# Patient Record
Sex: Male | Born: 1945
Health system: Southern US, Community
[De-identification: ages and names within clinical notes are randomized; demographics above are authoritative.]

## PROBLEM LIST (undated history)

## (undated) DIAGNOSIS — T8859XA Other complications of anesthesia, initial encounter: Secondary | ICD-10-CM

## (undated) DIAGNOSIS — K589 Irritable bowel syndrome without diarrhea: Secondary | ICD-10-CM

## (undated) DIAGNOSIS — I452 Bifascicular block: Secondary | ICD-10-CM

## (undated) DIAGNOSIS — Z860101 Personal history of adenomatous and serrated colon polyps: Secondary | ICD-10-CM

## (undated) DIAGNOSIS — I10 Essential (primary) hypertension: Secondary | ICD-10-CM

## (undated) DIAGNOSIS — E119 Type 2 diabetes mellitus without complications: Secondary | ICD-10-CM

## (undated) DIAGNOSIS — Z9889 Other specified postprocedural states: Secondary | ICD-10-CM

## (undated) DIAGNOSIS — K4020 Bilateral inguinal hernia, without obstruction or gangrene, not specified as recurrent: Secondary | ICD-10-CM

## (undated) DIAGNOSIS — F419 Anxiety disorder, unspecified: Secondary | ICD-10-CM

## (undated) DIAGNOSIS — T7840XA Allergy, unspecified, initial encounter: Secondary | ICD-10-CM

## (undated) DIAGNOSIS — Z8719 Personal history of other diseases of the digestive system: Secondary | ICD-10-CM

## (undated) DIAGNOSIS — Z86010 Personal history of colonic polyps: Secondary | ICD-10-CM

## (undated) DIAGNOSIS — E213 Hyperparathyroidism, unspecified: Secondary | ICD-10-CM

## (undated) DIAGNOSIS — K648 Other hemorrhoids: Secondary | ICD-10-CM

## (undated) DIAGNOSIS — K635 Polyp of colon: Secondary | ICD-10-CM

## (undated) DIAGNOSIS — R351 Nocturia: Secondary | ICD-10-CM

## (undated) DIAGNOSIS — T4145XA Adverse effect of unspecified anesthetic, initial encounter: Secondary | ICD-10-CM

## (undated) DIAGNOSIS — K5730 Diverticulosis of large intestine without perforation or abscess without bleeding: Secondary | ICD-10-CM

## (undated) DIAGNOSIS — K219 Gastro-esophageal reflux disease without esophagitis: Secondary | ICD-10-CM

## (undated) DIAGNOSIS — C4491 Basal cell carcinoma of skin, unspecified: Secondary | ICD-10-CM

## (undated) DIAGNOSIS — R51 Headache: Secondary | ICD-10-CM

## (undated) DIAGNOSIS — K602 Anal fissure, unspecified: Secondary | ICD-10-CM

## (undated) DIAGNOSIS — J302 Other seasonal allergic rhinitis: Secondary | ICD-10-CM

## (undated) DIAGNOSIS — K5150 Left sided colitis without complications: Secondary | ICD-10-CM

## (undated) DIAGNOSIS — E079 Disorder of thyroid, unspecified: Secondary | ICD-10-CM

## (undated) DIAGNOSIS — K4090 Unilateral inguinal hernia, without obstruction or gangrene, not specified as recurrent: Secondary | ICD-10-CM

## (undated) DIAGNOSIS — Z85828 Personal history of other malignant neoplasm of skin: Secondary | ICD-10-CM

## (undated) DIAGNOSIS — E785 Hyperlipidemia, unspecified: Secondary | ICD-10-CM

## (undated) DIAGNOSIS — R112 Nausea with vomiting, unspecified: Secondary | ICD-10-CM

## (undated) DIAGNOSIS — Z973 Presence of spectacles and contact lenses: Secondary | ICD-10-CM

## (undated) DIAGNOSIS — I422 Other hypertrophic cardiomyopathy: Secondary | ICD-10-CM

## (undated) DIAGNOSIS — K429 Umbilical hernia without obstruction or gangrene: Secondary | ICD-10-CM

## (undated) HISTORY — DX: Anal fissure, unspecified: K60.2

## (undated) HISTORY — DX: Irritable bowel syndrome, unspecified: K58.9

## (undated) HISTORY — DX: Unilateral inguinal hernia, without obstruction or gangrene, not specified as recurrent: K40.90

## (undated) HISTORY — DX: Essential (primary) hypertension: I10

## (undated) HISTORY — DX: Basal cell carcinoma of skin, unspecified: C44.91

## (undated) HISTORY — PX: COLONOSCOPY: SHX174

## (undated) HISTORY — DX: Other hemorrhoids: K64.8

## (undated) HISTORY — DX: Hyperlipidemia, unspecified: E78.5

## (undated) HISTORY — DX: Gastro-esophageal reflux disease without esophagitis: K21.9

## (undated) HISTORY — DX: Headache: R51

## (undated) HISTORY — DX: Umbilical hernia without obstruction or gangrene: K42.9

## (undated) HISTORY — PX: OTHER SURGICAL HISTORY: SHX169

## (undated) HISTORY — PX: PARATHYROIDECTOMY: SHX19

## (undated) HISTORY — DX: Type 2 diabetes mellitus without complications: E11.9

## (undated) HISTORY — DX: Polyp of colon: K63.5

## (undated) HISTORY — DX: Diverticulosis of large intestine without perforation or abscess without bleeding: K57.30

## (undated) HISTORY — DX: Hyperparathyroidism, unspecified: E21.3

## (undated) HISTORY — DX: Other seasonal allergic rhinitis: J30.2

## (undated) HISTORY — DX: Allergy, unspecified, initial encounter: T78.40XA

## (undated) HISTORY — DX: Disorder of thyroid, unspecified: E07.9

## (undated) HISTORY — DX: Anxiety disorder, unspecified: F41.9

---

## 1999-04-29 ENCOUNTER — Ambulatory Visit: Admission: RE | Admit: 1999-04-29 | Discharge: 1999-04-29 | Payer: Self-pay | Admitting: Internal Medicine

## 2002-01-06 ENCOUNTER — Encounter: Payer: Self-pay | Admitting: Internal Medicine

## 2002-01-06 ENCOUNTER — Ambulatory Visit (HOSPITAL_COMMUNITY): Admission: RE | Admit: 2002-01-06 | Discharge: 2002-01-06 | Payer: Self-pay | Admitting: Internal Medicine

## 2004-01-03 ENCOUNTER — Ambulatory Visit: Payer: Self-pay

## 2004-01-10 ENCOUNTER — Ambulatory Visit: Payer: Self-pay | Admitting: Cardiology

## 2004-02-13 ENCOUNTER — Ambulatory Visit: Payer: Self-pay | Admitting: Gastroenterology

## 2004-03-11 ENCOUNTER — Ambulatory Visit: Payer: Self-pay | Admitting: Cardiology

## 2005-03-19 ENCOUNTER — Ambulatory Visit: Payer: Self-pay | Admitting: Gastroenterology

## 2005-04-10 ENCOUNTER — Ambulatory Visit: Payer: Self-pay | Admitting: Gastroenterology

## 2005-04-10 ENCOUNTER — Encounter (INDEPENDENT_AMBULATORY_CARE_PROVIDER_SITE_OTHER): Payer: Self-pay | Admitting: *Deleted

## 2005-04-10 DIAGNOSIS — K5730 Diverticulosis of large intestine without perforation or abscess without bleeding: Secondary | ICD-10-CM | POA: Insufficient documentation

## 2006-04-23 ENCOUNTER — Ambulatory Visit: Payer: Self-pay | Admitting: Internal Medicine

## 2006-04-23 LAB — CONVERTED CEMR LAB
ALT: 37 units/L (ref 0–40)
AST: 22 units/L (ref 0–37)
Albumin: 3.9 g/dL (ref 3.5–5.2)
Alkaline Phosphatase: 86 units/L (ref 39–117)
BUN: 18 mg/dL (ref 6–23)
Basophils Absolute: 0.1 10*3/uL (ref 0.0–0.1)
Basophils Relative: 0.8 % (ref 0.0–1.0)
Bilirubin Urine: NEGATIVE
Bilirubin, Direct: 0.2 mg/dL (ref 0.0–0.3)
CO2: 31 meq/L (ref 19–32)
Calcium: 10.5 mg/dL (ref 8.4–10.5)
Chloride: 103 meq/L (ref 96–112)
Cholesterol: 223 mg/dL (ref 0–200)
Creatinine, Ser: 0.9 mg/dL (ref 0.4–1.5)
Direct LDL: 163.3 mg/dL
Eosinophils Absolute: 0.1 10*3/uL (ref 0.0–0.6)
Eosinophils Relative: 1.8 % (ref 0.0–5.0)
GFR calc Af Amer: 111 mL/min
GFR calc non Af Amer: 92 mL/min
Glucose, Bld: 147 mg/dL — ABNORMAL HIGH (ref 70–99)
HCT: 41.2 % (ref 39.0–52.0)
HDL: 33.6 mg/dL — ABNORMAL LOW (ref >39.0)
Hemoglobin, Urine: NEGATIVE
Hemoglobin: 14.2 g/dL (ref 13.0–17.0)
Ketones, ur: NEGATIVE mg/dL
Leukocytes, UA: NEGATIVE
Lymphocytes Relative: 24.6 % (ref 12.0–46.0)
MCHC: 34.5 g/dL (ref 30.0–36.0)
MCV: 84.4 fL (ref 78.0–100.0)
Monocytes Absolute: 0.6 10*3/uL (ref 0.2–0.7)
Monocytes Relative: 7.6 % (ref 3.0–11.0)
Neutro Abs: 5.1 10*3/uL (ref 1.4–7.7)
Neutrophils Relative %: 65.2 % (ref 43.0–77.0)
Nitrite: NEGATIVE
PSA: 0.82 ng/mL (ref 0.10–4.00)
Platelets: 260 10*3/uL (ref 150–400)
Potassium: 4.6 meq/L (ref 3.5–5.1)
RBC: 4.88 M/uL (ref 4.22–5.81)
RDW: 12.7 % (ref 11.5–14.6)
Sodium: 141 meq/L (ref 135–145)
Specific Gravity, Urine: 1.025 (ref 1.000–1.03)
TSH: 2.59 microintl units/mL (ref 0.35–5.50)
Total Bilirubin: 1.3 mg/dL — ABNORMAL HIGH (ref 0.3–1.2)
Total CHOL/HDL Ratio: 6.6
Total Protein, Urine: NEGATIVE mg/dL
Total Protein: 6.7 g/dL (ref 6.0–8.3)
Triglycerides: 147 mg/dL (ref 0–149)
Urine Glucose: NEGATIVE mg/dL
Urobilinogen, UA: 0.2 (ref 0.0–1.0)
VLDL: 29 mg/dL (ref 0–40)
WBC: 7.8 10*3/uL (ref 4.5–10.5)
pH: 6 (ref 5.0–8.0)

## 2006-04-29 ENCOUNTER — Ambulatory Visit: Payer: Self-pay | Admitting: Internal Medicine

## 2006-05-27 ENCOUNTER — Ambulatory Visit: Payer: Self-pay | Admitting: Internal Medicine

## 2006-05-27 LAB — CONVERTED CEMR LAB
ALT: 37 units/L (ref 0–40)
AST: 21 units/L (ref 0–37)
Cholesterol: 122 mg/dL (ref 0–200)
HDL: 32.9 mg/dL — ABNORMAL LOW (ref >39.0)
LDL Cholesterol: 74 mg/dL (ref 0–99)
Total CHOL/HDL Ratio: 3.7
Triglycerides: 78 mg/dL (ref 0–149)
VLDL: 16 mg/dL (ref 0–40)

## 2006-07-02 ENCOUNTER — Encounter: Payer: Self-pay | Admitting: Gastroenterology

## 2006-07-02 ENCOUNTER — Ambulatory Visit (HOSPITAL_COMMUNITY): Admission: RE | Admit: 2006-07-02 | Discharge: 2006-07-02 | Payer: Self-pay | Admitting: Gastroenterology

## 2006-07-02 DIAGNOSIS — K648 Other hemorrhoids: Secondary | ICD-10-CM | POA: Insufficient documentation

## 2006-07-22 ENCOUNTER — Ambulatory Visit: Payer: Self-pay | Admitting: Gastroenterology

## 2006-09-07 ENCOUNTER — Ambulatory Visit: Payer: Self-pay | Admitting: Gastroenterology

## 2007-03-11 ENCOUNTER — Encounter: Payer: Self-pay | Admitting: Internal Medicine

## 2007-03-11 ENCOUNTER — Telehealth: Payer: Self-pay | Admitting: Internal Medicine

## 2007-06-20 DIAGNOSIS — I10 Essential (primary) hypertension: Secondary | ICD-10-CM | POA: Insufficient documentation

## 2007-06-20 DIAGNOSIS — E785 Hyperlipidemia, unspecified: Secondary | ICD-10-CM | POA: Insufficient documentation

## 2007-06-20 DIAGNOSIS — D126 Benign neoplasm of colon, unspecified: Secondary | ICD-10-CM | POA: Insufficient documentation

## 2007-06-20 DIAGNOSIS — R519 Headache, unspecified: Secondary | ICD-10-CM | POA: Insufficient documentation

## 2007-06-20 DIAGNOSIS — K602 Anal fissure, unspecified: Secondary | ICD-10-CM | POA: Insufficient documentation

## 2007-06-20 DIAGNOSIS — K5289 Other specified noninfective gastroenteritis and colitis: Secondary | ICD-10-CM | POA: Insufficient documentation

## 2007-06-20 DIAGNOSIS — R51 Headache: Secondary | ICD-10-CM | POA: Insufficient documentation

## 2007-06-20 DIAGNOSIS — K5190 Ulcerative colitis, unspecified, without complications: Secondary | ICD-10-CM | POA: Insufficient documentation

## 2008-03-28 ENCOUNTER — Telehealth: Payer: Self-pay | Admitting: Internal Medicine

## 2008-05-02 ENCOUNTER — Encounter (INDEPENDENT_AMBULATORY_CARE_PROVIDER_SITE_OTHER): Payer: Self-pay | Admitting: *Deleted

## 2008-05-02 ENCOUNTER — Telehealth: Payer: Self-pay | Admitting: Internal Medicine

## 2008-06-04 ENCOUNTER — Telehealth: Payer: Self-pay | Admitting: Internal Medicine

## 2008-07-13 ENCOUNTER — Telehealth: Payer: Self-pay | Admitting: Gastroenterology

## 2008-07-20 ENCOUNTER — Telehealth: Payer: Self-pay | Admitting: Internal Medicine

## 2008-09-07 ENCOUNTER — Ambulatory Visit: Payer: Self-pay | Admitting: Internal Medicine

## 2008-09-07 LAB — CONVERTED CEMR LAB
ALT: 30 units/L (ref 0–53)
AST: 21 units/L (ref 0–37)
Albumin: 4.2 g/dL (ref 3.5–5.2)
Alkaline Phosphatase: 115 units/L (ref 39–117)
BUN: 18 mg/dL (ref 6–23)
Bilirubin, Direct: 0.2 mg/dL (ref 0.0–0.3)
CO2: 31 meq/L (ref 19–32)
Calcium: 11.2 mg/dL — ABNORMAL HIGH (ref 8.4–10.5)
Chloride: 104 meq/L (ref 96–112)
Cholesterol: 205 mg/dL — ABNORMAL HIGH (ref 0–200)
Creatinine, Ser: 0.9 mg/dL (ref 0.4–1.5)
Direct LDL: 151.6 mg/dL
GFR calc non Af Amer: 91.31 mL/min (ref >60)
Glucose, Bld: 124 mg/dL — ABNORMAL HIGH (ref 70–99)
HDL: 34.1 mg/dL — ABNORMAL LOW (ref >39.00)
Hgb A1c MFr Bld: 5.9 % (ref 4.6–6.5)
Potassium: 4.5 meq/L (ref 3.5–5.1)
Sodium: 141 meq/L (ref 135–145)
Total Bilirubin: 1.6 mg/dL — ABNORMAL HIGH (ref 0.3–1.2)
Total CHOL/HDL Ratio: 6
Total Protein: 7.1 g/dL (ref 6.0–8.3)
Triglycerides: 114 mg/dL (ref 0.0–149.0)
VLDL: 22.8 mg/dL (ref 0.0–40.0)

## 2008-09-13 ENCOUNTER — Ambulatory Visit: Payer: Self-pay | Admitting: Internal Medicine

## 2008-09-14 DIAGNOSIS — E118 Type 2 diabetes mellitus with unspecified complications: Secondary | ICD-10-CM | POA: Insufficient documentation

## 2008-11-15 ENCOUNTER — Telehealth (INDEPENDENT_AMBULATORY_CARE_PROVIDER_SITE_OTHER): Payer: Self-pay | Admitting: *Deleted

## 2008-11-22 ENCOUNTER — Ambulatory Visit: Payer: Self-pay | Admitting: Internal Medicine

## 2008-11-22 LAB — CONVERTED CEMR LAB
ALT: 34 units/L (ref 0–53)
AST: 20 units/L (ref 0–37)
Albumin: 3.8 g/dL (ref 3.5–5.2)
Alkaline Phosphatase: 110 units/L (ref 39–117)
Bilirubin, Direct: 0.2 mg/dL (ref 0.0–0.3)
Cholesterol: 160 mg/dL (ref 0–200)
HDL: 34 mg/dL — ABNORMAL LOW (ref >39.00)
LDL Cholesterol: 110 mg/dL — ABNORMAL HIGH (ref 0–99)
Total Bilirubin: 1.3 mg/dL — ABNORMAL HIGH (ref 0.3–1.2)
Total CHOL/HDL Ratio: 5
Total Protein: 6.8 g/dL (ref 6.0–8.3)
Triglycerides: 78 mg/dL (ref 0.0–149.0)
VLDL: 15.6 mg/dL (ref 0.0–40.0)

## 2008-11-24 ENCOUNTER — Encounter: Payer: Self-pay | Admitting: Internal Medicine

## 2009-11-07 ENCOUNTER — Ambulatory Visit: Payer: Self-pay | Admitting: Internal Medicine

## 2009-11-07 LAB — CONVERTED CEMR LAB
ALT: 30 units/L (ref 0–53)
AST: 24 units/L (ref 0–37)
Albumin: 4.1 g/dL (ref 3.5–5.2)
Alkaline Phosphatase: 108 units/L (ref 39–117)
BUN: 18 mg/dL (ref 6–23)
Basophils Absolute: 0 10*3/uL (ref 0.0–0.1)
Basophils Relative: 0.6 % (ref 0.0–3.0)
Bilirubin Urine: NEGATIVE
Bilirubin, Direct: 0.2 mg/dL (ref 0.0–0.3)
CO2: 30 meq/L (ref 19–32)
Calcium: 11.6 mg/dL — ABNORMAL HIGH (ref 8.4–10.5)
Chloride: 106 meq/L (ref 96–112)
Cholesterol: 206 mg/dL — ABNORMAL HIGH (ref 0–200)
Creatinine, Ser: 0.8 mg/dL (ref 0.4–1.5)
Direct LDL: 152.7 mg/dL
Eosinophils Absolute: 0.1 10*3/uL (ref 0.0–0.7)
Eosinophils Relative: 1.8 % (ref 0.0–5.0)
GFR calc non Af Amer: 98.49 mL/min (ref >60)
Glucose, Bld: 117 mg/dL — ABNORMAL HIGH (ref 70–99)
HCT: 41.7 % (ref 39.0–52.0)
HDL: 33.6 mg/dL — ABNORMAL LOW (ref >39.00)
Hemoglobin, Urine: NEGATIVE
Hemoglobin: 14.5 g/dL (ref 13.0–17.0)
Ketones, ur: NEGATIVE mg/dL
Leukocytes, UA: NEGATIVE
Lymphocytes Relative: 23.9 % (ref 12.0–46.0)
Lymphs Abs: 1.8 10*3/uL (ref 0.7–4.0)
MCHC: 34.7 g/dL (ref 30.0–36.0)
MCV: 86 fL (ref 78.0–100.0)
Monocytes Absolute: 0.5 10*3/uL (ref 0.1–1.0)
Monocytes Relative: 7.2 % (ref 3.0–12.0)
Neutro Abs: 5 10*3/uL (ref 1.4–7.7)
Neutrophils Relative %: 66.5 % (ref 43.0–77.0)
Nitrite: NEGATIVE
PSA: 0.98 ng/mL (ref 0.10–4.00)
Platelets: 213 10*3/uL (ref 150.0–400.0)
Potassium: 5.8 meq/L — ABNORMAL HIGH (ref 3.5–5.1)
RBC: 4.84 M/uL (ref 4.22–5.81)
RDW: 13.4 % (ref 11.5–14.6)
Sodium: 142 meq/L (ref 135–145)
Specific Gravity, Urine: 1.02 (ref 1.000–1.030)
TSH: 2.03 microintl units/mL (ref 0.35–5.50)
Total Bilirubin: 1.1 mg/dL (ref 0.3–1.2)
Total CHOL/HDL Ratio: 6
Total Protein, Urine: NEGATIVE mg/dL
Total Protein: 6.7 g/dL (ref 6.0–8.3)
Triglycerides: 134 mg/dL (ref 0.0–149.0)
Urine Glucose: NEGATIVE mg/dL
Urobilinogen, UA: 0.2 (ref 0.0–1.0)
VLDL: 26.8 mg/dL (ref 0.0–40.0)
WBC: 7.5 10*3/uL (ref 4.5–10.5)
pH: 6.5 (ref 5.0–8.0)

## 2009-11-13 ENCOUNTER — Encounter: Payer: Self-pay | Admitting: Internal Medicine

## 2009-11-13 ENCOUNTER — Ambulatory Visit: Payer: Self-pay | Admitting: Internal Medicine

## 2009-11-13 DIAGNOSIS — E663 Overweight: Secondary | ICD-10-CM | POA: Insufficient documentation

## 2009-11-13 DIAGNOSIS — D170 Benign lipomatous neoplasm of skin and subcutaneous tissue of head, face and neck: Secondary | ICD-10-CM | POA: Insufficient documentation

## 2010-01-03 ENCOUNTER — Telehealth: Payer: Self-pay | Admitting: Gastroenterology

## 2010-04-01 NOTE — Progress Notes (Signed)
Summary: RFs  Phone Note Call from Patient   Summary of Call: Pt had to reschedule apt untill july. Patient is requesting refills to last until then. Initial call taken by: Charlsie Quest,  June 04, 2008 11:12 AM  Follow-up for Phone Call        Called and left message on [phone] requesting medication that needs refills and the pharmacy of choice for same. Follow-up by: Iran Planas CMA,  June 05, 2008 10:24 AM  Additional Follow-up for Phone Call Additional follow up Details #1::        Pt left medication refill request [facility]asc [facility]rol [facility] Medication refilled and left message on pt voicemail in ref to same. Additional Follow-up by: Iran Planas CMA,  June 05, 2008 12:56 PM      Prescriptions: METOPROLOL SUCCINATE 50 MG  TB24 (METOPROLOL SUCCINATE) 1 once daily  #30 x 3   Entered by:   Iran Planas CMA   Authorized by:   Neena Rhymes MD   Signed by:   Iran Planas CMA on 06/05/2008   Method used:   [facility]ically to        [facility] [address] [phone]* (retail)       [address]       [place], [place]  [address]       Ph: [phone] or [phone]       Fax: [phone]   RxID:   [id number] [place] 10 MG TABS (AMLODIPINE BESYLATE) 1 once daily  #30 x 3   Entered by:   Iran Planas CMA   Authorized by:   Neena Rhymes MD   Signed by:   Iran Planas CMA on 06/05/2008   Method used:   Electronically to        [facility] [address] [phone]* (retail)       [place] [address]       [place], [place]  [address]       Ph: [phone] or [phone]       Fax: [phone]   RxID:   [id number]

## 2010-04-01 NOTE — Progress Notes (Signed)
  Phone Note Refill Request Call back at Work Phone [phone]   Refills Requested: Medication #1:  NORVASC 10 MG TABS 1 once daily  Medication #2:  METOPROLOL SUCCINATE 50 MG  TB24 1 once daily. Initial call taken by: Charlsie Quest,  May 02, 2008 5:08 PM  Follow-up for Phone Call        Pt informed  Follow-up by: Charlsie Quest,  May 02, 2008 5:22 PM      Prescriptions: METOPROLOL SUCCINATE 50 MG  TB24 (METOPROLOL SUCCINATE) 1 once daily  #30 x 1   Entered by:   Charlsie Quest   Authorized by:   Neena Rhymes MD   Signed by:   Charlsie Quest on 05/02/2008   Method used:   Electronically to        [facility] [address] [phone]* (retail)       [address]       [place], [place]  [address]       Ph: [phone] or [phone]       Fax: [phone]   RxID:   [id number] NORVASC 10 MG TABS (AMLODIPINE BESYLATE) 1 once daily  #30 x 1   Entered by:   Charlsie Quest   Authorized by:   Neena Rhymes MD   Signed by:   Charlsie Quest on 05/02/2008   Method used:   Electronically to        [facility] [address] [phone]* (retail)       [address]       [place], [place]  [address]       Ph: [phone] or [phone]       Fax: [phone]   RxID:   [id number]

## 2010-04-01 NOTE — Procedures (Signed)
Summary: Gastroenterology flex sig  Gastroenterology flex sig   Imported By: Marisue Humble CMA 06/22/2007 15:40:31  _____________________________________________________________________  External Attachment:    Type:   Image     Comment:   External Document

## 2010-04-01 NOTE — Procedures (Signed)
Summary: Gastroenterology colon  Gastroenterology colon   Imported By: Marisue Humble CMA 06/22/2007 15:41:12  _____________________________________________________________________  External Attachment:    Type:   Image     Comment:   External Document

## 2010-04-01 NOTE — Progress Notes (Signed)
Summary: Schedule office visit   Phone Note Outgoing Call Call back at Home Phone [phone]   Call placed by: Genella Mech CMA [facility]),  January 03, 2010 12:54 PM Summary of Call: Called pt and spoke with wife about the Asacol refills. Explained to pts wife that pt needs to call the office to schedule a office appointment for any further refills. She stated she would have him call and schedule when he gets off of work. Initial call taken by: Genella Mech CMA [facility]),  January 03, 2010 12:55 PM

## 2010-04-01 NOTE — Assessment & Plan Note (Signed)
Summary: EXTENED OFFICE VISIT PER JESSICA/ $50 /NWS   Vital Signs:  Patient profile:   65 year old male Height:      74 inches Weight:      234.50 pounds BMI:     30.22 O2 Sat:      97 % on Room air Temp:     99.1 degrees F oral Pulse rate:   72 / minute BP sitting:   142 / 78  (left arm) Cuff size:   regular  Vitals Entered By: Dwana Curd, CMA (September 13, 2008 1:30 PM)  O2 Flow:  Room air Comments Asacol dosage? 4 daily   Primary Care Provider:  Neena Rhymes MD   History of Present Illness: Has had a good year with no major medical issues. He has  been on an improved life-style with smart food choices and reduced portion size with a 22 lb weight loss over the past 26 months.     Current Medications (verified): 1)  Norvasc 10 Mg Tabs (Amlodipine Besylate) .Marland Kitchen.. 1 Once Daily 2)  Metoprolol Succinate 50 Mg  Tb24 (Metoprolol Succinate) .... Take 1 By Mouth Once Daily Pls Keep July Appt For Addtional Refills  Allergies (verified): No Known Drug Allergies  Past History:  Past Medical History: Reviewed history from 06/20/2007 and no changes required. 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 Work is usual amount of stress.  Review of Systems  The patient denies anorexia, fever, weight loss, weight gain, vision loss, decreased hearing, hoarseness, chest pain, syncope, dyspnea on exertion, peripheral edema,  prolonged cough, headaches, hemoptysis, abdominal pain, melena, hematochezia, severe indigestion/heartburn, hematuria, incontinence, muscle weakness, suspicious skin lesions, difficulty walking, depression, unusual weight change, enlarged lymph nodes, angioedema, and testicular masses.    Physical Exam  General:  WNWD white male in no distress. He has had obvious weight loss and looks much more trim. Head:  Normocephalic and atraumatic without obvious abnormalities. No apparent alopecia or balding. Eyes:  No corneal or conjunctival inflammation noted. EOMI. Perrla. Funduscopic exam benign, without hemorrhages, exudates or papilledema. Vision grossly normal. Ears:  External ear exam shows no significant lesions or deformities.  Otoscopic examination reveals clear canals, tympanic membranes are intact bilaterally without bulging, retraction, inflammation or discharge. Hearing is grossly normal bilaterally. Nose:  no external deformity and no external erythema.   Mouth:  Oral mucosa and oropharynx without lesions or exudates.  Teeth in good repair. Neck:  supple, full ROM, no thyromegaly, and no carotid bruits.   Chest Wall:  no deformities.  AT upper back left there is a lipoma - approx 5 cm, soft. Lungs:  Normal respiratory effort, chest expands symmetrically. Lungs are clear to auscultation, no crackles or wheezes. Heart:  Normal rate and regular rhythm. S1 and S2 normal without gallop, murmur, click, rub or other extra sounds. Abdomen:  soft, non-tender, normal bowel sounds, no masses, no guarding, no rigidity, no abdominal hernia, and no hepatomegaly.   Rectal:  No external abnormalities noted. Normal sphincter tone. No rectal masses or tenderness. Genitalia:  circumcised,  no hydrocele, and no varicocele.  Mild testicular atrophy bilaterally (stable) Prostate:  Prostate gland firm and smooth, no enlargement, nodularity, tenderness, mass, asymmetry or induration. Msk:  normal ROM, no joint  tenderness, no joint swelling, no joint warmth, and no joint deformities.   Pulses:  2+ radial and DP pulses Extremities:  No clubbing, cyanosis, edema, or deformity noted with normal full range of motion of all joints.   Neurologic:  alert & oriented X3, cranial nerves II-XII intact, strength normal in all extremities, gait normal, and DTRs symmetrical and normal.   Skin:  lipoma as noted. scarring from cystic acne -back. Several comedone and active acne lesions back, no suspicious lesions. Cervical Nodes:  no anterior cervical adenopathy and no posterior cervical adenopathy.   Psych:  Oriented X3, memory intact for recent and remote, normally interactive, and good eye contact.     Impression & Recommendations:  Problem # 1:  OTHER ABNORMAL GLUCOSE (ICD-790.29) Chart and e-chart reviewed. There have been several elevated surum glucose readings, to a max of 147 in '08. No previous A1C results or GTT results. Labs for today with a A1C of 5.9% - normal range. Patient with mild hyperglycemia but he does not have convincing evidence of diabetes.  Plan - continue with a prudent low or no sugar diet, moderate carbohydrates.  Problem # 2:  HYPERLIPIDEMIA (ZYY-482.4) Lab reveals LDL of 151.6 with goal of 130 or less and ideally 100 or less. Discussed the NCEP guidelines and the concept of cardiac risk. His level is high in the face of weight loss and a conscientious diet. He had tried lipitor in the past but did not tolerate it due to muscle pain and fatigue. He is willing to give treatment another chance.  Plan - lovastatin 77m daily with f/u lab in 6 weeks. If not at goal will advance to 443m          If not tolerated will try Crestor at 49m43m/wk  His updated medication list for this problem includes:    Lovastatin 20 Mg Tabs (Lovastatin) ....Marland Kitchen 1po once daily  Problem # 3:  HYPERTENSION (ICD-401.9)  His updated medication list for this problem includes:    Norvasc 10 Mg Tabs (Amlodipine  besylate) ....Marland Kitchen 1 once daily    Metoprolol Succinate 50 Mg Tb24 (Metoprolol succinate) ....Marland Kitchen Take 1 by mouth once daily pls keep july appt for addtional refills  BP today: 142/78  [place] control. Recommend BP checks at home. If systolic BP runs consistently greater than 140 will add diuretic to regimen.  Problem # 4:  IBD (ICD-558.9) Stable on present meds.  Problem # 5:  Preventive Health Care (ICD-V70.0) FabBentonb of weight management with 22 lbs loss, if not more. He can already feel the benefits. Exam is unremarkable including a normal digital rectal exam. No PSA done but there is no indication with a normal exam. Current with colorectal cancer screening. Given Tetnus and pneumovax today.  Chart reviewed: he had a chest x-ray '05 read as mild cardiomegaly. He also had 2D echo and nuclear stress test in '05 that were both normal with no left ventricular hypertrophy, normal ejection fraction and no evidence of ischemia. No evidence of any cardiac disease.  In summary: a healthy man who has taken charge of his health. He will return for lipid labs as noted. He will return for office visit in 1 year or as needed.   Complete Medication List: 1)  Norvasc 10 Mg Tabs (Amlodipine  besylate) .Marland Kitchen.. 1 once daily 2)  Metoprolol Succinate 50 Mg Tb24 (Metoprolol succinate) .... Take 1 by mouth once daily pls keep july appt for addtional refills 3)  Asacol 400 Mg Tbec (Mesalamine) .... 4 tabs daily for colitis 4)  Lovastatin 20 Mg Tabs (Lovastatin) .Marland Kitchen.. 1po once daily    Tests: (1) BMP (METABOL)   Sodium                    141 mEq/L                   135-145   Potassium                 4.5 mEq/L                   3.5-5.1   Chloride                  104 mEq/L                   96-112   Carbon Dioxide            31 mEq/L                    19-32   Glucose              [H]  124 mg/dL                   70-99   BUN                       18 mg/dL                    6-23   Creatinine                 0.9 mg/dL                   0.4-1.5   Calcium              [H]  11.2 mg/dL                  8.4-10.5   GFR                       91.31 mL/min                >60  Tests: (2) Lipid Panel (LIPID)   Cholesterol          [H]  205 mg/dL                   0-200     ATP III Classification            Desirable:  < 200 mg/dL                    Borderline High:  200 - 239 mg/dL               High:  > = 240 mg/dL   Triglycerides             114.0 mg/dL                 0.0-149.0     Normal:  <150 mg/dL     Borderline High:  150 - 199 mg/dL   HDL                  [  L]  34.10 mg/dL                 >39.00   VLDL Cholesterol          22.8 mg/dL                  0.0-40.0  CHO/HDL Ratio:  CHD Risk                             6                    Men          Women     1/2 Average Risk     3.4          3.3     Average Risk          5.0          4.4     2X Average Risk          9.6          7.1     3X Average Risk          15.0          11.0                           Tests: (3) Hepatic/Liver Function Panel (HEPATIC)   Total Bilirubin      [H]  1.6 mg/dL                   0.3-1.2   Direct Bilirubin          0.2 mg/dL                   0.0-0.3   Alkaline Phosphatase      115 U/L                     39-117   AST                       21 U/L                      0-37   ALT                       30 U/L                      0-53   Total Protein             7.1 g/dL                    6.0-8.3   Albumin                   4.2 g/dL                    3.5-5.2  Tests: (4) Hemoglobin A1C (A1C)   Hemoglobin A1C            5.9 %                       4.6-6.5     Glycemic Control Guidelines for People with Diabetes:     Non Diabetic:  <6%     Goal  of Therapy: <7%     Additional Action Suggested:  >8%   Tests: (5) Cholesterol LDL - Direct (DIRLDL)  Cholesterol LDL - Direct                             151.6 mg/dL Prescriptions: METOPROLOL SUCCINATE 50 MG  TB24 (METOPROLOL SUCCINATE) take 1 by mouth once daily  Pls keep july appt for addtional refills  #90 x 3   Entered and Authorized by:   Neena Rhymes MD   Signed by:   Neena Rhymes MD on 09/13/2008   Method used:   Electronically to        [facility] (mail-order)             ,          Ph: [phone]       Fax: [phone]   RxID:   [id number] NORVASC 10 MG TABS (AMLODIPINE BESYLATE) 1 once daily  #90 x 3   Entered and Authorized by:   Neena Rhymes MD   Signed by:   Neena Rhymes MD on 09/13/2008   Method used:   Electronically to        [facility] (mail-order)             ,          Ph: [phone]       Fax: [phone]   RxID:   [id number] LOVASTATIN 20 MG TABS (LOVASTATIN) 1po once daily  #30 x 12   Entered and Authorized by:   Neena Rhymes MD   Signed by:   Neena Rhymes MD on 09/13/2008   Method used:   [facility]        [facility] [address] [phone]* (retail)       [place] [address]       [place], [place]  [address]       Ph: [phone] or [phone]       Fax: [phone]   RxID:   [id number]

## 2010-04-01 NOTE — Progress Notes (Signed)
Summary: Refill request last seen 04/2006  Phone Note Refill Request Message from:  Fax from Pharmacy  Refills Requested: Medication #1:  METOPROLOL SUCCINATE 50 MG  TB24 1 once daily.  Medication #2:  NORVASC 10 MG TABS 1 once daily [facility] fax [phone] member #[phone]  pt last seen 04/29/2006 no future appointments scheduled.  Initial call taken by: Iran Planas CMA,  March 28, 2008 4:48 PM  Follow-up for Phone Call        refill BP meds for two mnths. Needs OV with labs in advance: lipid 272.4, B8M 754.4, metabolic 920.1, hepatic 007.1 Follow-up by: Neena Rhymes MD,  March 29, 2008 6:32 AM  Additional Follow-up for Phone Call Additional follow up Details #1::        Left voicemail on pt home # to return call to office in ref to medication. Additional Follow-up by: Iran Planas CMA,  March 30, 2008 10:26 AM    Additional Follow-up for Phone Call Additional follow up Details #2::    Pt requests CB from [facility] Follow-up by: Sherlean Foot CMA,  March 30, 2008 11:52 AM  Additional Follow-up for Phone Call Additional follow up Details #3:: Details for Additional Follow-up Action Taken: Advised pt of Dr. Linda Hedges note, pt transferred to [facility] for appointment. Labs entered into [facility]. Additional Follow-up by: Iran Planas CMA,  March 30, 2008 2:57 PM    Prescriptions: METOPROLOL SUCCINATE 50 MG  TB24 (METOPROLOL SUCCINATE) 1 once daily  #30 x 1   Entered by:   Iran Planas CMA   Authorized by:   Neena Rhymes MD   Signed by:   Iran Planas CMA on 03/30/2008   Method used:   Electronically to        [facility] [address] [phone]* (retail)       [address]       [place], [place]  [address]       Ph: [phone] or [phone]       Fax: [phone]   RxID:   [id number] NORVASC 10 MG TABS (AMLODIPINE BESYLATE) 1 once daily  #30 x 1   Entered by:   Iran Planas CMA   Authorized by:    Neena Rhymes MD   Signed by:   Iran Planas CMA on 03/30/2008   Method used:   Electronically to        [facility] [address] [phone]* (retail)       [place] [address]       [place], [place]  [address]       Ph: [phone] or [phone]       Fax: [phone]   RxID:   [id number]

## 2010-04-01 NOTE — Letter (Signed)
[facility] [place], [place]  [address] Phone: [phone]      November 24, 2008   [place] [address] [place], [place] [address]  RE:  LAB RESULTS  Dear  [name],  The following is an interpretation of your most recent lab tests.  Please take note of any instructions provided or changes to medications that have resulted from your lab work.  LIVER FUNCTION TESTS:  Good - no changes needed  LIPID PANEL:  Good - no changes needed Triglyceride: 78.0   Cholesterol: 160   LDL: 110   HDL: 34.00   Chol/HDL%:  5     Very good response to medication. Continue present treatment.  Call or e-mail me if you have questions ([email or web address]).   Sincerely Yours,    Neena Rhymes MD  Patient: Patrick Floyd Note: All result statuses are Final unless otherwise noted.  Tests: (1) Lipid Panel (LIPID)   Cholesterol               160 mg/dL                   0-200     ATP III Classification            Desirable:  < 200 mg/dL                    Borderline High:  200 - 239 mg/dL               High:  > = 240 mg/dL   Triglycerides             78.0 mg/dL                  0.0-149.0     Normal:  <150 mg/dL     Borderline High:  150 - 199 mg/dL   HDL                  [L]  34.00 mg/dL                 >39.00   VLDL Cholesterol          15.6 mg/dL                  0.0-40.0   LDL Cholesterol      [H]  110 mg/dL                   0-99  CHO/HDL Ratio:  CHD Risk                             5                    Men          Women     1/2 Average Risk     3.4          3.3     Average Risk          5.0          4.4     2X Average Risk          9.6          7.1     3X Average Risk          15.0          11.0  Tests: (2) Hepatic/Liver Function Panel (HEPATIC)   Total Bilirubin      [H]  1.3 mg/dL                   0.3-1.2   Direct Bilirubin          0.2 mg/dL                   0.0-0.3   Alkaline Phosphatase       110 U/L                     39-117   AST                       20 U/L                      0-37   ALT                       34 U/L                      0-53   Total Protein             6.8 g/dL                    6.0-8.3   Albumin                   3.8 g/dL                    3.5-5.2

## 2010-04-01 NOTE — Progress Notes (Signed)
Summary: RFs  Phone Note Call from Patient Call back at Work Phone [phone]   Summary of Call: Patient is requesting [facility]ills [facility], did not give names of meds. Initial call taken by: Charlsie Quest,  Jul 20, 2008 5:10 PM  Follow-up for Phone Call        Called pt and asked which medications he needs refills on. Pt requesting both medications to be sent to [facility] with #90 b/c insurance will not allow him to have [facility], pt has an appointment with Dr. Linda Hedges September 13, 2008. Follow-up by: Iran Planas CMA,  Jul 23, 2008 3:49 PM      Prescriptions: METOPROLOL SUCCINATE 50 MG  TB24 (METOPROLOL SUCCINATE) 1 once daily  #90 x 0   Entered by:   Iran Planas CMA   Authorized by:   Neena Rhymes MD   Signed by:   Iran Planas CMA on 07/23/2008   Method used:   Electronically to        [facility] (mail-order)             ,          Ph: [phone]       Fax: [phone]   RxID:   [id number] [place] 10 MG TABS (AMLODIPINE BESYLATE) 1 once daily  #90 x 0   Entered by:   Iran Planas CMA   Authorized by:   Neena Rhymes MD   Signed by:   Iran Planas CMA on 07/23/2008   Method used:   Electronically to        [facility] (mail-order)             ,          Ph: [phone]       Fax: [phone]   RxID:   [id number]

## 2010-04-01 NOTE — Letter (Signed)
Summary: Primary Care Appointment Letter  [facility]   [place], [place] [address]   Phone: [phone]  Fax: [phone]    05/02/2008 MRN: [id number]  HERMILO DUTTER [place], [place]  [address]  Dear Mr. Kumari,   Your Primary Care Physician Neena Rhymes MD has indicated that:    _______it is time to schedule an appointment.    _______you missed your appointment on______ and need to call and          reschedule.    _______you need to have lab work done.    _______you need to schedule an appointment discuss lab or test results.    ___X____you need to call to reschedule your appointment that is                       scheduled on _3/11/10 @ 11:10 WITH DR MICHEAL NORINS.     Please call our office as soon as possible. Our phone number is [phone]. Please press option 1. Our office is open 8a-12noon and 1p-5p, Monday through Friday.     Thank you,    [place]

## 2010-04-01 NOTE — Progress Notes (Signed)
Summary: Mail order meds & Local pharm  Phone Note Call from Patient   Summary of Call: Patient is requesting 2 meds to be called into local pharm [facility] for [facility] supply OK? Norvasc 154m1 once daily & Metoprolol Succ ER 5048m once daily. Initial call taken by: SarCharlsie QuestJanuary  9, 2009 4:32 PM  Follow-up for Phone Call        OK to refill NOrvasc 8m29m0 refill as needed; metoprolol 50 #30 refill prn Follow-up by: MichNeena Rhymes  March 11, 2007 6:17 PM  Additional Follow-up for Phone Call Additional follow up Details #1::        Rx's printed and waiting for signature, to be faxed to called into [facility] Additional Follow-up by: SaraCharlsie Questanuary  9, 2009 6:23 PM    Additional Follow-up for Phone Call Additional follow up Details #2::    rx's faxed Follow-up by: SaraCharlsie Questanuary 13, 2009 8:25 AM  New/Updated Medications: NORVASC 10 MG TABS (AMLODIPINE BESYLATE) 1 once daily METOPROLOL SUCCINATE 50 MG  TB24 (METOPROLOL SUCCINATE) 1 once daily   Prescriptions: METOPROLOL SUCCINATE 50 MG  TB24 (METOPROLOL SUCCINATE) 1 once daily  #90 x 3   Entered by:   SaraCharlsie Questuthorized by:   MichNeena Rhymes  Signed by:   SaraCharlsie Quest01/11/2007   Method used:   Print then Give to Patient   RxID:   [id number] LyonMG TABS (AMLODIPINE BESYLATE) 1 once daily  #90 x 3   Entered by:   SaraCharlsie Questuthorized by:   MichNeena Rhymes  Signed by:   SaraCharlsie Quest01/11/2007   Method used:   Print then Give to Patient   RxID:   [id number] SUCCINATE 50 MG  TB24 (METOPROLOL SUCCINATE) 1 once daily  #30 x 0   Entered by:   SaraCharlsie Questuthorized by:   MichNeena Rhymes  Signed by:   SaraCharlsie Quest01/11/2007   Method used:   Electronically [facility] to ...       [phone]    [address]       [place]  [id number]   Ph: [phone] Fax: [phone]:   [id number] 10 MG TABS (AMLODIPINE BESYLATE) 1 once daily  #30 x 0   Entered by:   SaraCharlsie Questuthorized by:   MichNeena Rhymes  Signed by:   SaraCharlsie Quest01/11/2007   Method used:   [facility] to ...       [facility] #[phone]    [address]    [place]  [id number]   Ph: [phone]   Fax: [phone]:   [id number]

## 2010-04-01 NOTE — Medication Information (Signed)
Summary: Norvasc & Metoprol refills/[facility]  Norvasc & Metoprol refills/[facility]   Imported By: Jerrye Noble D'jimraou 04/04/2007 13:23:22  _____________________________________________________________________  External Attachment:    Type:   Image     Comment:   External Document

## 2010-04-01 NOTE — Progress Notes (Signed)
Summary: Labs  Phone Note Call from Patient   Summary of Call: Patient is requesting order for labs. He has been on lovastatin x aprox 8 wks. Per EMR office note, pt is to come back for lipids. He would like liver checked also.  Initial call taken by: Charlsie Quest, [place],  November 15, 2008 5:29 PM  Follow-up for Phone Call        yes for labs 272.4 lipids, 995.20 hepatic Follow-up by: Neena Rhymes MD,  November 15, 2008 6:53 PM  Additional Follow-up for Phone Call Additional follow up Details #1::        Pt informed, please add to idx [facility]!! Additional Follow-up by: Charlsie Quest, CMA,  November 16, 2008 8:37 AM    Additional Follow-up for Phone Call Additional follow up Details #2::    Orders in [place]. Follow-up by: Denice Paradise,  November 16, 2008 8:43 AM

## 2010-04-01 NOTE — Assessment & Plan Note (Signed)
Summary: PHYSICAL--STC   Vital Signs:  Patient profile:   65 year old male Height:      73 inches Weight:      232 pounds BMI:     30.72 O2 Sat:      98 % on Room air Temp:     97.8 degrees F oral Pulse rate:   50 / minute BP sitting:   130 / 82  (left arm) Cuff size:   regular  Vitals Entered By: Charlynne Cousins CMA (November 13, 2009 10:32 AM)  O2 Flow:  Room air CC: cpx/ ab   Primary Care Provider:  Neena Rhymes MD  CC:  cpx/ ab.  History of Present Illness: Patient presents for routine follow-up.  He reports that he will have pruritis of the hands and mild swelling that occurs spontaneously. It can be very uncomfortable. Claritin helps. He does note that there is a weak association with him being in the den at home around the time his symptoms develop. He has had increased reaction to hymenoptra during the year but has not had any angioedema affecting mouth or throat or airway.  He has had several episodes of GI distress with pain and diarrhea. Does seem to be associated with beer or possibly onions. 5-6 episodes last year. Does use maalox and lomotil with success. He does have a hisotyr of colitis but the symptoms do not seem to be related to flares.  Has a growing mass at the left posterior neck that is becoming uncomfortable. Probably lipoma. Patient due to increasing discomfort is willing  to have surgical excision.   Current Medications (verified): 1)  Norvasc 10 Mg Tabs (Amlodipine Besylate) .Marland Kitchen.. 1 Once Daily 2)  Metoprolol Succinate 50 Mg  Tb24 (Metoprolol Succinate) .... Take 1 By Mouth Once Daily Pls Keep July Appt For Addtional Refills 3)  Asacol 400 Mg Tbec (Mesalamine) .... 4 Tabs Daily For Colitis 4)  Lovastatin 20 Mg Tabs (Lovastatin) .Marland Kitchen.. 1po Once Daily  Allergies (verified): No Known Drug Allergies  Past History:  Past Medical History: DIVERTICULOSIS, COLON (ICD-562.10) COLONIC POLYPS, HYPERPLASTIC (ICD-211.3) HEMORRHOIDS, INTERNAL (ICD-455.0) DM  (ICD-250.00) HYPERLIPIDEMIA (ICD-272.4) HEADACHE (ICD-784.0) HYPERTENSION (ICD-401.9) IBD (ICD-558.9) COLITIS, ULCERATIVE (ICD-556.9) ANAL FISSURE (ICD-565.0)  Past Surgical History: Reviewed history from 09/13/2008 and no changes required. none  Family History: Reviewed history from 09/13/2008 and no changes required. 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 Work is usual amount of stress.  Review of Systems       The patient complains of weight loss, peripheral edema, and abdominal pain.  The patient denies anorexia, fever, vision loss, decreased hearing, hoarseness, chest pain, syncope, dyspnea on exertion, prolonged cough, headaches, melena, hematochezia, incontinence, muscle weakness, suspicious skin lesions, difficulty walking, depression, abnormal bleeding, enlarged lymph nodes, and angioedema.    Physical Exam  General:  Tall overweight white male in no distress Head:  Normocephalic and atraumatic without obvious abnormalities. No apparent alopecia or balding. Eyes:  No corneal or conjunctival inflammation noted. EOMI. Perrla. Funduscopic exam benign, without hemorrhages, exudates or papilledema. Vision grossly normal. Ears:  External ear exam shows no significant lesions or deformities.  Otoscopic examination reveals clear canals, tympanic membranes are intact bilaterally without bulging, retraction, inflammation or discharge. Hearing is grossly normal bilaterally. Nose:  no external deformity and no external erythema.   Mouth:  Oral mucosa and oropharynx without lesions or exudates.  Teeth in good repair. Neck:  supple, no masses, no thyromegaly, and no carotid bruits.   Chest Wall:  no deformities.   Lungs:  Normal respiratory effort, chest expands  symmetrically. Lungs are clear to auscultation, no crackles or wheezes. Heart:  Normal rate and regular rhythm. S1 and S2 normal without gallop, murmur, click, rub or other extra sounds. Abdomen:  soft, non-tender, normal bowel sounds, no distention, no guarding, no rigidity, no inguinal hernia, and no hepatomegaly.   Rectal:  deferred to normal PSA Msk:  normal ROM, no joint tenderness, no joint swelling, and no joint warmth.   Pulses:  2+ radial and dorsalis pedis pulses Extremities:  trace pedal edema bilaterally Neurologic:  alert & oriented X3, cranial nerves II-XII intact, strength normal in all extremities, gait normal, and DTRs symmetrical and normal.   Skin:  Old acne scars on upper back. 6 cm soft tissue mass superior aspect of left scapula/base of neck. Cervical Nodes:  no anterior cervical adenopathy and no posterior cervical adenopathy.   Psych:  Oriented X3, normally interactive, good eye contact, and not anxious appearing.     Impression & Recommendations:  Problem # 1:  OTHER ABNORMAL GLUCOSE (ICD-790.29) Patient has a mild elevation in fasting blood sugar. In last labs serum glucose was slightly highter. At last lab an A1C was in normal range.  Plan - prudent diet: low carbs!  Problem # 2:  HYPERLIPIDEMIA (HYW-737.4) Patient did have a good response to lovastatin with LDL down to 110. However, the drug made him fell bad. New LDL is approx 152 - just shy of NCEP threshold for medical treatment. Did a Framingham Cardiac risk calculation = 17% risk of cardiac event in the next 10 years - moderate risk. Discussed options -   Plan - life-style mangement with low fat diet and increased exercise with repeat lipid panel in six months. If not at or close to goal will need to consider trial of crestor or non-statin therapy  The following medications were removed from the medication list:    Lovastatin 20 Mg Tabs (Lovastatin) .Marland Kitchen... 1po once daily  Problem # 3:  HYPERTENSION  (ICD-401.9)  His updated medication list for this problem includes:    Norvasc 10 Mg Tabs (Amlodipine besylate) .Marland Kitchen... 1 once daily    Metoprolol Succinate 50 Mg Tb24 (Metoprolol succinate) .Marland Kitchen... Take 1 by mouth once daily pls keep july appt for addtional refills  BP today: 130/82 Prior BP: 142/78 (09/13/2008)  Labs Reviewed: K+: 5.8 (11/07/2009) Creat: : 0.8 (11/07/2009)    adequate  but suboptimal control on prsent medications.   Plan - see weight management plan  Problem # 4:  IBD (ICD-558.9) stable on present regimen  Problem # 5:  OBESITY, CLASS I (ICD-278.02) Patient has brought his weight down from approximately 250 to 232 which is very good. discussed the overall importance of weight management in regard to hypertension, hyperlipidemia, cardiac risk and sense of well-being. Discussed weight management strategy of smart food choices, PORTION SIZE CONTROL and exercise.  Plan - weight managment through life-style change. Target weight 210. Goal - loss of 1 lb/month  Problem # 6:  LIPOMA (ICD-214.9) Patient with probable lipoma that has been increasing in size and is no causing discomfort.  Plan - refer to general surgery for consideration of excision.  Problem # 7:  Preventive Health Care (ICD-V70.0)  Interval  history per HPI. His symptoms of hand swelling that responds to claritin suggest allergic reaction. His liver functions are normal. If he has progressive symptoms, including hymenoptra sensitivity, will refer for full allergy evaluation.  His physical exam is normal except for lipoma. Lab results with elevated LDL cholesterol and borderline fasting glucose. Will first work with life-style management. The importance of regular exercise is stressed. Follow-up lab in 6 months. Immunizations - tetns '00, given flu today, he will check for insurance coverage of shingles vaccine. Normal PSA on labs. Had flex sig May '08. Had normal EKG today.  In summary - a nice man who will  work on reducing cholesterol levels, glucose levels which in [place] will help with weight loss.  Orders: EKG w/ Interpretation (93000)  Complete Medication List: 1)  Norvasc 10 Mg Tabs (Amlodipine besylate) .Marland Kitchen.. 1 once daily 2)  Metoprolol Succinate 50 Mg Tb24 (Metoprolol succinate) .... Take 1 by mouth once daily pls keep july appt for addtional refills 3)  Asacol 400 Mg Tbec (Mesalamine) .... 4 tabs daily for colitis  Patient: Patrick Floyd Note: All result statuses are Final unless otherwise noted.  Tests: (1) Lipid Panel (LIPID)   Cholesterol          [H]  206 mg/dL                   0-200     ATP III Classification            Desirable:  < 200 mg/dL                    Borderline High:  200 - 239 mg/dL               High:  > = 240 mg/dL   Triglycerides             134.0 mg/dL                 0.0-149.0     Normal:  <150 mg/dL     Borderline High:  150 - 199 mg/dL   HDL                  [L]  33.60 mg/dL                 >39.00   VLDL Cholesterol          26.8 mg/dL                  0.0-40.0  CHO/HDL Ratio:  CHD Risk                             6                    Men          Women     1/2 Average Risk     3.4          3.3     Average Risk          5.0          4.4     2X Average Risk          9.6          7.1     3X Average Risk          15.0          11.0  Tests: (2) BMP (METABOL)   Sodium                    142 mEq/L                   135-145   Potassium            [H]  5.8 mEq/L                   3.5-5.1   Chloride                  106 mEq/L                   96-112   Carbon Dioxide            30 mEq/L                    19-32   Glucose              [H]  117 mg/dL                   70-99   BUN                       18 mg/dL                    6-23   Creatinine                0.8 mg/dL                   0.4-1.5   Calcium              [H]  11.6 mg/dL                  8.4-10.5   GFR                       98.49 mL/min                >60  Tests:  (3) CBC Platelet w/Diff (CBCD)   White Cell Count          7.5 K/uL                    4.5-10.5   Red Cell Count            4.84 Mil/uL                 4.22-5.81   Hemoglobin                14.5 g/dL                   13.0-17.0   Hematocrit                41.7 %                      39.0-52.0   MCV                       86.0 fl                     78.0-100.0   MCHC  34.7 g/dL                   30.0-36.0   RDW                       13.4 %                      11.5-14.6   Platelet Count            213.0 K/uL                  150.0-400.0   Neutrophil %              66.5 %                      43.0-77.0   Lymphocyte %              23.9 %                      12.0-46.0   Monocyte %                7.2 %                       3.0-12.0   Eosinophils%              1.8 %                       0.0-5.0   Basophils %               0.6 %                       0.0-3.0   Neutrophill Absolute      5.0 K/uL                    1.4-7.7   Lymphocyte Absolute       1.8 K/uL                    0.7-4.0   Monocyte Absolute         0.5 K/uL                    0.1-1.0  Eosinophils, Absolute                             0.1 K/uL                    0.0-0.7   Basophils Absolute        0.0 K/uL                    0.0-0.1  Tests: (4) Hepatic/Liver Function Panel (HEPATIC)   Total Bilirubin           1.1 mg/dL                   0.3-1.2   Direct Bilirubin          0.2 mg/dL                   0.0-0.3   Alkaline Phosphatase      108 U/L  39-117   AST                       24 U/L                      0-37   ALT                       30 U/L                      0-53   Total Protein             6.7 g/dL                    6.0-8.3   Albumin                   4.1 g/dL                    3.5-5.2  Tests: (5) TSH (TSH)   FastTSH                   2.03 uIU/mL                 0.35-5.50  Tests: (6) Prostate Specific Antigen (PSA)   PSA-Hyb                   0.98 ng/mL                   0.10-4.00  Tests: (7) UDip Only (UDIP)   Color                     LT. YELLOW       RANGE:  Yellow;Lt. Yellow   Clarity                   CLEAR                       Clear   Specific Gravity          1.020                       1.000 - 1.030   Urine Ph                  6.5                         5.0-8.0   Protein                   NEGATIVE                    Negative   Urine Glucose             NEGATIVE                    Negative   Ketones                   NEGATIVE                    Negative   Urine Bilirubin           NEGATIVE  Negative   Blood                     NEGATIVE                    Negative   Urobilinogen              0.2                         0.0 - 1.0   Leukocyte Esterace        NEGATIVE                    Negative   Nitrite                   NEGATIVE                    Negative  Tests: (8) Cholesterol LDL - Direct (DIRLDL)  Cholesterol LDL - Direct                             152.7 mg/dL     Optimal:  <100 mg/dL     Near or Above Optimal:  100-129 mg/dL     Borderline High:  130-159 mg/dL     High:  160-189 mg/dL     Very High:  >190 mg/dL  Immunization History:  Tetanus/Td Immunization History:    Tetanus/Td:  historical (06/12/1998)  Influenza Immunization History:    Influenza:  historical (11/13/2009)

## 2010-04-01 NOTE — Progress Notes (Signed)
Summary: Asacol refill   Phone Note From Pharmacy Call back at [facility] [phone]   Summary of Call: Rx sent by fax for Asacol refill   Refilled and faxed back to [facility] Initial call taken by: Genella Mech CMA,  Jul 13, 2008 8:34 AM

## 2010-07-15 NOTE — Assessment & Plan Note (Signed)
[facility] OFFICE NOTE   NAME:Patrick Floyd, Patrick Floyd                     MRN:          [id number]  DATE:09/07/2006                            DOB:          1945/12/17    PROBLEM:  Anal fissure.   REASON:  Mr. Carfagno has returned for re-evaluation.  He underwent Botox  injection a couple of months ago.  His anal fissure symptoms  significantly improved until he had a bout of diarrhea approximately 1  month later which set off his symptoms again.  On conservative therapy  symptoms have again improved.  He is currently just using warm soaks and  baby wipes.  Mild colitis was seen at sigmoidoscopy.  Mr. Kmetz has a  history of ulcerative colitis dating at least 15 years.   PHYSICAL EXAMINATION:  Pulse 76, blood pressure 150/86, weight 240.  HEENT:  EOMI. PERRLA. Sclerae are anicteric.  Conjunctivae are pink.  NECK:  Supple without thyromegaly, adenopathy or carotid bruits.  CHEST:  Clear to auscultation and percussion without adventitious  sounds.  CARDIAC:  Regular rhythm; normal S1 S2.  There are no murmurs, gallops  or rubs.  ABDOMEN:  Bowel sounds are normoactive.  Abdomen is soft, non-tender and  non-distended.  There are no abdominal masses, tenderness, splenic  enlargement or hepatomegaly.  EXTREMITIES:  Full range of motion.  No cyanosis, clubbing or edema.  RECTAL:  There are no masses.  Stool is Hemoccult negative.   IMPRESSION:  1. Anal fissure - improving on conservative therapy - status post      Botox injection.  2. Ulcerative colitis.   RECOMMENDATION:  1. No further medical therapy for fissure.  2. Followup colonoscopy for colorectal cancer screening surveillance.     Sandy Salaam. Deatra Ina, MD,FACG  Electronically Signed    RDK/MedQ  DD: 09/07/2006  DT: 09/07/2006  Job #: [id number]

## 2010-07-18 NOTE — Assessment & Plan Note (Signed)
[facility]                           PRIMARY CARE OFFICE NOTE   NAME:BETHEILAvyay, Floyd                     MRN:          [id number]  DATE:04/29/2006                            DOB:          24-May-1945    [name] is a 65 year old Caucasian gentleman who presents for annual  followup evaluation and exam.  He was last seen in Primary Care  November 09, 2003.  Please see that dictation.  In the interval the  patient has been followed by the Cardiology service for a history of  chest discomfort.  Last Cardiology visit dates from March 11, 2004, at  which time the patient was stable.  The patient in the interval has also  been seen on multiple occasions by the GI service, last March 19, 2005.  He is followed for colitis.  The patient reports that he does  adjust his Asacol on his own whenever he has a flare.   The patient reports that he has had a problem with what he thinks is  hemorrhoidal discomfort and bleeding.  This has not been severe.   PAST SURGICAL HISTORY:  None.   TRAUMA:  Fractured finger at age 32.   PAST MEDICAL HISTORY:  1. Usual childhood disease.  2. Inflammatory bowel disease/colitis since age 65.  34. Hypertension.  4. Headaches.  5. Hyperlipidemia.   CURRENT MEDICATIONS:  1. Toprol XL 50 mg daily.  2. Norvasc 10 mg daily.  3. Asacol 400 mg t.i.d. which he titrates as needed.   PHYSICIAN ROSTER:  Dr. Deatra Ina for GI, Dr. Ron Parker for Cardiology.   FAMILY HISTORY:  Negative for coronary artery disease.  Positive for  hypertension.  Negative for colon cancer.   SOCIAL HISTORY:  The patient's marriage is in good health.  He has a 26-  year-old daughter.  His work is usual stress.   CHART REVIEW:  Last colonoscopy dates from April 10, 2005, which  showed diverticulosis, unspecified colitis and colon polyps.  The  patient did have a stress Myoview study done January 03, 2004, which was  a negative study with no evidence of  ischemic disease.  Last 2D echo  from January 03, 2004, which was an unremarkable study.  Last chest x-  ray is from November 09, 2003, with acute bronchitis with no other  abnormalities noted.   REVIEW OF SYSTEMS:  Negative for any constitutional problems.  He has  had an eye exam in the last 12 months.  No ENT, cardiovascular,  respiratory complaints, GI as above, GU is unremarkable with nocturia  x1.  No musculoskeletal or neurologic complaints.   PHYSICAL EXAMINATION:  Temperature was 97, blood pressure 160/97, pulse  78, weight 256, height 6 feet 1 inch.  GENERAL APPEARANCE:  This is a heavyset, overweight, Caucasian male,  looks his stated age, in no acute distress.  HEENT:  Normocephalic, atraumatic.  EACs and TMS were unremarkable.  Oropharynx with native dentition in good repair.  No buccal or palatal  lesions were noted, posterior pharynx was clear, conjunctivae and  sclerae were clear, PERRLA, EOMI.  Funduscopic exam  was unremarkable.  NECK:  Supple without thyromegaly.  NODES:  No adenopathy was noted in the cervical or supraclavicular  regions.  CHEST:  No CVA tenderness.  LUNGS:  Clear to auscultation and percussion.  CARDIOVASCULAR:  2+ radial pulses, no JVD or carotid bruits.  He had a  quiet precordium with a regular rate and rhythm, without murmurs, rubs  or gallops.  ABDOMEN:  Soft, no guarding, no rebound, no organosplenomegaly was  noted.  GENITALIA:  Normal bilaterally descended testicles.  RECTAL EXAM:  Normal sphincter tone was noted.  There were no external  skin tags or hemorrhoids.  There was a palpable internal hemorrhoid at  the 12 o'clock position which was tender.  The prostate was smooth,  round and normal in size and contour without nodules.  EXTREMITIES:  Without clubbing, cyanosis, edema or deformity.  NEUROLOGIC:  Exam was nonfocal.  SKIN:  Clear.   DATABASE:  Hemoglobin 14.2 grams, white count was 7800 with a normal  differential, all normal.   Chemistries revealed a serum glucose of 147.  Electrolytes were normal, kidney function normal with a creatinine of  0.9 and a GFR of 92 mL/minute.  Liver functions were unremarkable.  Cholesterol 223, triglycerides 147, HDL was 33.6, LDL was 163.3.  Thyroid function normal with a TSH of 2.59.  PSA was normal at 0.82.  Urinalysis was negative.   ASSESSMENT AND PLAN:  1. Hypertension:  The patient reports his blood pressure is much      better at home, in the 120s over 80s.  The plan is to continue his      present medications.  2. Lipids:  The patient's last lipid profile in 2005 had an LDL      cholesterol of 183.  It is now somewhat lower but still above goal      which would be an LDL of 100 or less.  The patient does have some      increase risk for heart disease, see below.  Plan:  Lipitor 20 mg      daily with the patient to return in 4 weeks for followup laboratory      and we will adjust his medications as needed to attain a goal of      LDL less than 100.  3. Diabetes:  The patient had a fasting serum glucose of 147, which      puts him into the diabetic range.  His last serum glucose in 2005      was 117, so this is a trend.  Discussed with the patient the issue      of blood sugar management and the mechanisms of diabetes.  I      stressed the importance of getting this problem under control early      to avoid end organ damage.  Because of his elevated serum glucose      the patient does have increased cardiac risk, therefore the LDL      goal was adjusted downwards to 100 or less, as above.  Plan:  The      patient is provided with information on type 2 diabetes in adults      as well as a carb counting diet.  The first attempt will be      lifestyle management with the patient to avoid sweets, to reduce      carbohydrate intake and to increase his exercise.  The patient will      return in 3  months for a hemoglobin A1c.  I did discuss with the patient the combination of  being overweight,  hypertension, hyperlipidemia and elevated serum glucose as a metabolic  syndrome with the increased risks therein.  I did stress to the patient  the importance of getting all these problems under better control,  particularly lipids and glucose.  He took this to heart and I believe  will in fact modify his diet and increase his exercise.  I did agree  that if he lost 20 to 25 pounds we could recheck labs at that point to  see if he has brought his cholesterol down to a level that he can get by  without medication, although there is some inherent benefit of statin  drugs and diabetics.   HEALTH MAINTENANCE:  The patient has current colorectal cancer  screening, his PSA and prostate exam were normal.   In summary, a pleasant gentleman with problems outlined above, resolved  to manage the serum glucose levels with lifestyle management and we will  start Lipitor as noted.     Heinz Knuckles Norins, MD  Electronically Signed    MEN/MedQ  DD: 04/29/2006  DT: 04/29/2006  Job #: [id number]   cc:   Ladon Applebaum

## 2010-07-19 ENCOUNTER — Other Ambulatory Visit: Payer: Self-pay | Admitting: Internal Medicine

## 2010-10-01 ENCOUNTER — Other Ambulatory Visit: Payer: Self-pay | Admitting: Internal Medicine

## 2010-11-07 ENCOUNTER — Encounter: Payer: Self-pay | Admitting: Gastroenterology

## 2010-11-17 ENCOUNTER — Other Ambulatory Visit: Payer: Self-pay

## 2010-11-20 ENCOUNTER — Encounter: Payer: Self-pay | Admitting: Internal Medicine

## 2010-12-05 ENCOUNTER — Encounter: Payer: Self-pay | Admitting: Gastroenterology

## 2010-12-05 ENCOUNTER — Ambulatory Visit (AMBULATORY_SURGERY_CENTER): Payer: BC Managed Care – PPO | Admitting: *Deleted

## 2010-12-05 VITALS — Ht 73.0 in | Wt 241.2 lb

## 2010-12-05 DIAGNOSIS — Z1211 Encounter for screening for malignant neoplasm of colon: Secondary | ICD-10-CM

## 2010-12-05 MED ORDER — PEG-KCL-NACL-NASULF-NA ASC-C 100 G PO SOLR: ORAL | Status: DC

## 2010-12-05 NOTE — Progress Notes (Signed)
Talked with Dr. [name] is due for recall colonoscopy now.  Last colonoscopy 2007.  Pt with history colitis. Patrick Floyd

## 2010-12-08 ENCOUNTER — Other Ambulatory Visit: Payer: Self-pay | Admitting: Internal Medicine

## 2010-12-08 ENCOUNTER — Other Ambulatory Visit (INDEPENDENT_AMBULATORY_CARE_PROVIDER_SITE_OTHER): Payer: BC Managed Care – PPO

## 2010-12-08 DIAGNOSIS — Z0000 Encounter for general adult medical examination without abnormal findings: Secondary | ICD-10-CM

## 2010-12-08 LAB — TSH: TSH: 1.84 u[IU]/mL (ref 0.35–5.50)

## 2010-12-08 LAB — CBC WITH DIFFERENTIAL/PLATELET
Basophils Absolute: 0 10*3/uL (ref 0.0–0.1)
Basophils Relative: 0.3 % (ref 0.0–3.0)
Eosinophils Absolute: 0.2 10*3/uL (ref 0.0–0.7)
Eosinophils Relative: 2 % (ref 0.0–5.0)
HCT: 43.3 % (ref 39.0–52.0)
Hemoglobin: 14.5 g/dL (ref 13.0–17.0)
Lymphocytes Relative: 23.3 % (ref 12.0–46.0)
Lymphs Abs: 2.2 10*3/uL (ref 0.7–4.0)
MCHC: 33.5 g/dL (ref 30.0–36.0)
MCV: 87.2 fl (ref 78.0–100.0)
Monocytes Absolute: 0.6 10*3/uL (ref 0.1–1.0)
Monocytes Relative: 6 % (ref 3.0–12.0)
Neutro Abs: 6.4 10*3/uL (ref 1.4–7.7)
Neutrophils Relative %: 68.4 % (ref 43.0–77.0)
Platelets: 213 10*3/uL (ref 150.0–400.0)
RBC: 4.97 Mil/uL (ref 4.22–5.81)
RDW: 13.7 % (ref 11.5–14.6)
WBC: 9.3 10*3/uL (ref 4.5–10.5)

## 2010-12-08 LAB — BASIC METABOLIC PANEL
BUN: 24 mg/dL — ABNORMAL HIGH (ref 6–23)
CO2: 31 mEq/L (ref 19–32)
Calcium: 11 mg/dL — ABNORMAL HIGH (ref 8.4–10.5)
Chloride: 102 mEq/L (ref 96–112)
Creatinine, Ser: 0.9 mg/dL (ref 0.4–1.5)
GFR: 95.51 mL/min (ref >60.00)
Glucose, Bld: 122 mg/dL — ABNORMAL HIGH (ref 70–99)
Potassium: 4.6 mEq/L (ref 3.5–5.1)
Sodium: 140 mEq/L (ref 135–145)

## 2010-12-08 LAB — LIPID PANEL
Cholesterol: 216 mg/dL — ABNORMAL HIGH (ref 0–200)
HDL: 37.4 mg/dL — ABNORMAL LOW (ref >39.00)
Total CHOL/HDL Ratio: 6
Triglycerides: 228 mg/dL — ABNORMAL HIGH (ref 0.0–149.0)
VLDL: 45.6 mg/dL — ABNORMAL HIGH (ref 0.0–40.0)

## 2010-12-08 LAB — HEPATIC FUNCTION PANEL
ALT: 31 U/L (ref 0–53)
AST: 21 U/L (ref 0–37)
Albumin: 4.2 g/dL (ref 3.5–5.2)
Alkaline Phosphatase: 125 U/L — ABNORMAL HIGH (ref 39–117)
Bilirubin, Direct: 0.1 mg/dL (ref 0.0–0.3)
Total Bilirubin: 0.7 mg/dL (ref 0.3–1.2)
Total Protein: 6.8 g/dL (ref 6.0–8.3)

## 2010-12-08 LAB — LDL CHOLESTEROL, DIRECT: Direct LDL: 145 mg/dL

## 2010-12-12 ENCOUNTER — Ambulatory Visit (INDEPENDENT_AMBULATORY_CARE_PROVIDER_SITE_OTHER): Payer: BC Managed Care – PPO | Admitting: Internal Medicine

## 2010-12-12 VITALS — BP 148/80 | HR 71 | Temp 98.4°F | Wt 239.0 lb

## 2010-12-12 DIAGNOSIS — D179 Benign lipomatous neoplasm, unspecified: Secondary | ICD-10-CM

## 2010-12-12 DIAGNOSIS — E785 Hyperlipidemia, unspecified: Secondary | ICD-10-CM

## 2010-12-12 DIAGNOSIS — R7309 Other abnormal glucose: Secondary | ICD-10-CM

## 2010-12-12 DIAGNOSIS — Z23 Encounter for immunization: Secondary | ICD-10-CM

## 2010-12-12 DIAGNOSIS — I10 Essential (primary) hypertension: Secondary | ICD-10-CM

## 2010-12-12 DIAGNOSIS — K5190 Ulcerative colitis, unspecified, without complications: Secondary | ICD-10-CM

## 2010-12-12 DIAGNOSIS — E663 Overweight: Secondary | ICD-10-CM

## 2010-12-12 DIAGNOSIS — Z0000 Encounter for general adult medical examination without abnormal findings: Secondary | ICD-10-CM

## 2010-12-12 MED ORDER — ZOSTER VACCINE LIVE 19400 UNT/0.65ML ~~LOC~~ SOLR: 0.6500 mL | Freq: Once | SUBCUTANEOUS | Status: DC

## 2010-12-12 NOTE — Progress Notes (Signed)
Subjective:    Patient ID: Patrick Floyd, male    DOB: [birth year], 65 y.o.   MRN: [id number]  HPI Patrick Floyd presdents for routine medical follow-up He has several questions: 1) cloudy urine w/o symptoms 2) stable lipoma 3) he has occasional pain in the left posterior neck, he has a click w/ certain movement 4) Tenderness in the hands, not necessarily in the joint 5) pruritis - happens every day. Question if related to toprol xl 6) difficult year - wife dx'd w/ breast cancer - second bout. Estrogen receptor positive and after lumpectomy having hormone therapy; friend age 11 died of massive MI; mother in-law with alzheimer's and in hospice.    Review of Systems Constitutional:  Negative for fever, chills, activity change and unexpected weight change.  HEENT:  Negative for hearing loss, ear pain, congestion, neck stiffness and postnasal drip. Negative for sore throat or swallowing problems. Negative for dental complaints.   Eyes: Negative for vision loss or change in visual acuity.  Respiratory: Negative for chest tightness and wheezing. Negative for DOE.   Cardiovascular: Negative for chest pain or palpitations. No decreased exercise tolerance Gastrointestinal: No change in bowel habit. No bloating or gas. No reflux or indigestion Genitourinary: Negative for urgency, frequency, flank pain and difficulty urinating.  Musculoskeletal: Negative for myalgias, back pain, arthralgias and gait problem.  Neurological: Negative for dizziness, tremors, weakness and headaches.  Hematological: Negative for adenopathy.  Psychiatric/Behavioral: Negative for behavioral problems and dysphoric mood.       Objective:   Physical Exam Vital signs reviewed- BP borderline high. Gen'l: Well nourished well developed overweight white male in no acute distress  HEENT: Head: Normocephalic and atraumatic. Right Ear: External ear normal. EAC/TM nl. Left Ear: External ear normal.  EAC/TM nl. Nose: Nose normal.  Mouth/Throat: Oropharynx is clear and moist. Dentition - native, in good repair. No buccal or palatal lesions. Posterior pharynx clear. Eyes: Conjunctivae and sclera clear. EOM intact. Pupils are equal, round, and reactive to light. Right eye exhibits no discharge. Left eye exhibits no discharge. Neck: Normal range of motion. Neck supple. No JVD present. No tracheal deviation present. No thyromegaly present.  Cardiovascular: Normal rate, regular rhythm, no gallop, no friction rub, no murmur heard.      Quiet precordium. 2+ radial and DP pulses . No carotid bruits Pulmonary/Chest: Effort normal. No respiratory distress or increased WOB, no wheezes, no rales. No chest wall deformity or CVAT. Abdominal: Soft. Bowel sounds are normal in all quadrants. He exhibits no distension, no tenderness, no rebound or guarding, No heptosplenomegaly  Genitourinary:  prostate exam deferred Musculoskeletal: Normal range of motion. He exhibits no edema and no tenderness.       Small and large joints without redness, synovial thickening or deformity. Full range of motion preserved about all small, median and large joints.  Lymphadenopathy:    He has no cervical or supraclavicular adenopathy.  Neurological: He is alert and oriented to person, place, and time. CN II-XII intact. DTRs 2+ and symmetrical biceps, radial and patellar tendons. Cerebellar function normal with no tremor, rigidity, normal gait and station.  Skin: Skin is warm and dry. No rash noted. No erythema. Mass left upper back Psychiatric: He has a normal mood and affect. His behavior is normal. Thought content normal.   Lab Results  Component Value Date   WBC 9.3 12/08/2010   HGB 14.5 12/08/2010   HCT 43.3 12/08/2010   PLT 213.0 12/08/2010   GLUCOSE 122* 12/08/2010   CHOL  216* 12/08/2010   TRIG 228.0* 12/08/2010   HDL 37.40* 12/08/2010   LDLDIRECT 145.0 12/08/2010   LDLCALC 110* 11/22/2008   ALT 31 12/08/2010   AST 21 12/08/2010   NA 140 12/08/2010   K 4.6  12/08/2010   CL 102 12/08/2010   CREATININE 0.9 12/08/2010   BUN 24* 12/08/2010   CO2 31 12/08/2010   TSH 1.84 12/08/2010   PSA 0.98 11/07/2009   HGBA1C 5.9 09/07/2008           Assessment & Plan:

## 2010-12-15 DIAGNOSIS — Z0000 Encounter for general adult medical examination without abnormal findings: Secondary | ICD-10-CM | POA: Insufficient documentation

## 2010-12-15 NOTE — Assessment & Plan Note (Signed)
Elevated triglycerides and LDL is above target of 130 or less but below treatment threshold. Discussed with patient  Plan - continue with life-style management: low fat diet and regular exercise            Follow -up lab

## 2010-12-15 NOTE — Assessment & Plan Note (Signed)
Stable without change or discomfort

## 2010-12-15 NOTE — Assessment & Plan Note (Signed)
Stable.  Plan - follow-up with GI

## 2010-12-15 NOTE — Assessment & Plan Note (Signed)
Interval hisotry without new problems. Physical exam is remarkable for weight but otherwise unchanged. Lab results generally OK with mild elevation glucose and lipids. Current with colorectal cancer screening. PSA '11 0.98  In summary - a very nice man who will do better if he can loose some weight which will have a very positive effect on Hypertension, cholesterol and glucose management. He will return as needed or in 1 year.

## 2010-12-15 NOTE — Assessment & Plan Note (Signed)
Stressed the importance of weight management in regard to other metabolic issues: hypertension, cholesterol and blood sugar.  Plan - weight management: smart food choices; PORTION SIZE CONTROL; regular aerobic exercise           Target weight 220; goal to loose 1-2 lbs per month

## 2010-12-15 NOTE — Assessment & Plan Note (Signed)
BP Readings from Last 3 Encounters:  12/12/10 148/80  11/13/09 130/82  09/13/08 142/78   Borderline control today.   Plan - continue present medications but take a drug holiday from metoprolol as potential source of pruritus.

## 2010-12-15 NOTE — Assessment & Plan Note (Signed)
Lab Results  Component Value Date   HGBA1C 5.9 09/07/2008   Serum glucose is mildly elevated. He is on no medication.   Plan - diet management and exercise

## 2010-12-17 ENCOUNTER — Other Ambulatory Visit: Payer: Self-pay | Admitting: Gastroenterology

## 2010-12-26 ENCOUNTER — Other Ambulatory Visit: Payer: Self-pay | Admitting: Internal Medicine

## 2011-01-06 ENCOUNTER — Ambulatory Visit (AMBULATORY_SURGERY_CENTER): Payer: BC Managed Care – PPO | Admitting: Gastroenterology

## 2011-01-06 ENCOUNTER — Encounter: Payer: Self-pay | Admitting: Gastroenterology

## 2011-01-06 DIAGNOSIS — K5730 Diverticulosis of large intestine without perforation or abscess without bleeding: Secondary | ICD-10-CM

## 2011-01-06 DIAGNOSIS — Z1211 Encounter for screening for malignant neoplasm of colon: Secondary | ICD-10-CM

## 2011-01-06 DIAGNOSIS — K5289 Other specified noninfective gastroenteritis and colitis: Secondary | ICD-10-CM

## 2011-01-06 DIAGNOSIS — D126 Benign neoplasm of colon, unspecified: Secondary | ICD-10-CM

## 2011-01-06 DIAGNOSIS — K5190 Ulcerative colitis, unspecified, without complications: Secondary | ICD-10-CM

## 2011-01-06 MED ORDER — SODIUM CHLORIDE 0.9 % IV SOLN: 500.0000 mL | INTRAVENOUS | Status: DC

## 2011-01-07 ENCOUNTER — Telehealth: Payer: Self-pay

## 2011-01-07 NOTE — Telephone Encounter (Signed)

## 2011-02-15 ENCOUNTER — Other Ambulatory Visit: Payer: Self-pay | Admitting: Internal Medicine

## 2011-08-09 ENCOUNTER — Other Ambulatory Visit: Payer: Self-pay | Admitting: Internal Medicine

## 2011-10-25 ENCOUNTER — Other Ambulatory Visit: Payer: Self-pay | Admitting: Internal Medicine

## 2011-10-27 ENCOUNTER — Other Ambulatory Visit: Payer: Self-pay | Admitting: *Deleted

## 2011-10-28 MED ORDER — MESALAMINE 400 MG PO TBEC: 400.0000 mg | DELAYED_RELEASE_TABLET | Freq: Three times a day (TID) | ORAL | Status: DC

## 2011-10-28 NOTE — Telephone Encounter (Signed)
Pt aware [facility]

## 2011-10-29 ENCOUNTER — Telehealth: Payer: Self-pay | Admitting: *Deleted

## 2011-10-30 ENCOUNTER — Telehealth: Payer: Self-pay

## 2011-10-30 MED ORDER — MESALAMINE 400 MG PO CPDR: 1600.0000 mg | DELAYED_RELEASE_CAPSULE | Freq: Three times a day (TID) | ORAL | Status: DC

## 2011-10-30 NOTE — Telephone Encounter (Signed)
Left message for pt to call either me or Robin back with his question

## 2011-10-30 NOTE — Telephone Encounter (Signed)
Pt returned my call and questioned why we had sent a rx for asacol 400 1 tid when he usually takes 4 tid.  I told him that had been done in error and that I would correct it, including switching the rx to Delzicol to replace the no longer available Asacol.  Sent new rx for Delzicol 400, 4 capsules tid.  Pt acknowledged

## 2011-12-16 ENCOUNTER — Encounter: Payer: Self-pay | Admitting: Gastroenterology

## 2012-01-25 ENCOUNTER — Telehealth: Payer: Self-pay | Admitting: Gastroenterology

## 2012-01-25 ENCOUNTER — Encounter: Payer: Self-pay | Admitting: Gastroenterology

## 2012-01-25 NOTE — Telephone Encounter (Signed)
Pt called and he has problems taking large capsules. Pt states he plans to open the capsule and just take the pill that is inside the capsule. Discussed with pt that this should be ok to do.

## 2012-02-04 ENCOUNTER — Other Ambulatory Visit: Payer: Self-pay | Admitting: Internal Medicine

## 2012-02-05 ENCOUNTER — Other Ambulatory Visit: Payer: Self-pay | Admitting: *Deleted

## 2012-02-05 MED ORDER — MESALAMINE 400 MG PO CPDR: 1600.0000 mg | DELAYED_RELEASE_CAPSULE | Freq: Three times a day (TID) | ORAL | Status: DC

## 2012-02-05 MED ORDER — AMLODIPINE BESYLATE 10 MG PO TABS: 10.0000 mg | ORAL_TABLET | Freq: Every day | ORAL | Status: DC

## 2012-02-05 MED ORDER — METOPROLOL SUCCINATE ER 50 MG PO TB24: 50.0000 mg | ORAL_TABLET | Freq: Every day | ORAL | Status: DC

## 2012-03-11 ENCOUNTER — Encounter: Payer: Self-pay | Admitting: Gastroenterology

## 2012-03-11 ENCOUNTER — Ambulatory Visit (AMBULATORY_SURGERY_CENTER): Payer: BC Managed Care – PPO | Admitting: *Deleted

## 2012-03-11 VITALS — Ht 73.0 in | Wt 238.0 lb

## 2012-03-11 DIAGNOSIS — Z1211 Encounter for screening for malignant neoplasm of colon: Secondary | ICD-10-CM

## 2012-03-11 MED ORDER — SUPREP BOWEL PREP KIT 17.5-3.13-1.6 GM/177ML PO SOLN: ORAL | Status: DC

## 2012-03-23 ENCOUNTER — Telehealth: Payer: Self-pay | Admitting: Gastroenterology

## 2012-03-23 NOTE — Telephone Encounter (Signed)
Returned patient's call at home number and wife answered stating he was at his office.  I called his office and left message on voicemail that it was fine as long as he doesn't eat anything else solid today and to follow his prep instructions.  Advised him to call me if he had any further questions.

## 2012-03-24 ENCOUNTER — Ambulatory Visit (AMBULATORY_SURGERY_CENTER): Payer: BC Managed Care – PPO | Admitting: Gastroenterology

## 2012-03-24 ENCOUNTER — Encounter: Payer: Self-pay | Admitting: Gastroenterology

## 2012-03-24 VITALS — BP 146/84 | HR 66 | Temp 97.9°F | Resp 34 | Ht 73.0 in | Wt 238.0 lb

## 2012-03-24 DIAGNOSIS — D126 Benign neoplasm of colon, unspecified: Secondary | ICD-10-CM

## 2012-03-24 DIAGNOSIS — K5190 Ulcerative colitis, unspecified, without complications: Secondary | ICD-10-CM

## 2012-03-24 DIAGNOSIS — Z1211 Encounter for screening for malignant neoplasm of colon: Secondary | ICD-10-CM

## 2012-03-24 MED ORDER — SODIUM CHLORIDE 0.9 % IV SOLN: 500.0000 mL | INTRAVENOUS | Status: DC

## 2012-03-24 NOTE — Op Note (Signed)
[facility]  [address] [place], [id number]   COLONOSCOPY PROCEDURE REPORT  PATIENT: Patrick Floyd, Patrick Floyd  MR#: [id number] BIRTHDATE: 1945-09-04 , 66  yrs. old GENDER: Male ENDOSCOPIST: Inda Castle, MD REFERRED BY: PROCEDURE DATE:  03/24/2012 PROCEDURE:   Colonoscopy with biopsy ASA CLASS:   Class II INDICATIONS:follow up for previously diagnosed left sided ulcerative colitis. MEDICATIONS: MAC sedation, administered by CRNA and propofol (Diprivan) 43m IV  DESCRIPTION OF PROCEDURE:   After the risks benefits and alternatives of the procedure were thoroughly explained, informed consent was obtained.  A digital rectal exam revealed no abnormalities of the rectum.   The LB CF-[id number] endoscope was introduced through the anus and advanced to the cecum, which was identified by both the appendix and ileocecal valve. No adverse events experienced.   The quality of the prep was Suprep excellent The instrument was then slowly withdrawn as the colon was fully examined.    The mucosa throughout the colon was lavaged with methylene impregnated normal saline. Areas of increased uptake were selectively biopsied. In the sigmoid and rectum there was mild erythema. There were several areas of increased uptake of methylene blue dye were biopsied. Proximal to the sigmoid colon there were very few areas of increased uptake of methylene blue which were selectively biopsied. The mucosa appeared grossly normal. Retroflexed views revealed no abnormalities. The time to cecum=6 minutes 50 seconds.  Withdrawal time=30 minutes 37 seconds.  The scope was withdrawn and the procedure completed. COMPLICATIONS: There were no complications.  ENDOSCOPIC IMPRESSION: Left sided Colitis - in remission RECOMMENDATIONS:  Await biopsy results eSigned:  RInda Castle MD 03/24/2012 10:03 AM   cc: MNeena Rhymes MD

## 2012-03-24 NOTE — Patient Instructions (Addendum)
YOU HAD AN ENDOSCOPIC PROCEDURE TODAY AT [facility]: Refer to the procedure report that was given to you for any specific questions about what was found during the examination.  If the procedure report does not answer your questions, please call your gastroenterologist to clarify.  If you requested that your care partner not be given the details of your procedure findings, then the procedure report has been included in a sealed envelope for you to review at your convenience later.  YOU SHOULD EXPECT: Some feelings of bloating in the abdomen. Passage of more gas than usual.  Walking can help get rid of the air that was put into your GI tract during the procedure and reduce the bloating. If you had a lower endoscopy (such as a colonoscopy or flexible sigmoidoscopy) you may notice spotting of blood in your stool or on the toilet paper. If you underwent a bowel prep for your procedure, then you may not have a normal bowel movement for a few days.  DIET: Your first meal following the procedure should be a light meal and then it is ok to progress to your normal diet.  A half-sandwich or bowl of soup is an example of a good first meal.  Heavy or fried foods are harder to digest and may make you feel nauseous or bloated.  Likewise meals heavy in dairy and vegetables can cause extra gas to form and this can also increase the bloating.  Drink plenty of fluids but you should avoid alcoholic beverages for 24 hours.  ACTIVITY: Your care partner should take you home directly after the procedure.  You should plan to take it easy, moving slowly for the rest of the day.  You can resume normal activity the day after the procedure however you should NOT DRIVE or use heavy machinery for 24 hours (because of the sedation medicines used during the test).    SYMPTOMS TO REPORT IMMEDIATELY: A gastroenterologist can be reached at any hour.  During normal business hours, 8:30 AM to 5:00 PM Monday through Friday,  call [phone].  After hours and on weekends, please call the GI answering service at [phone] who will take a message and have the physician on call contact you.   Following lower endoscopy (colonoscopy or flexible sigmoidoscopy):  Excessive amounts of blood in the stool  Significant tenderness or worsening of abdominal pains  Swelling of the abdomen that is new, acute  Fever of 100F or higher  FOLLOW UP: If any biopsies were taken you will be contacted by phone or by letter within the next 1-3 weeks.  Call your gastroenterologist if you have not heard about the biopsies in 3 weeks.  Our staff will call the home number listed on your records the next business day following your procedure to check on you and address any questions or concerns that you may have at that time regarding the information given to you following your procedure. This is a courtesy call and so if there is no answer at the home number and we have not heard from you through the emergency physician on call, we will assume that you have returned to your regular daily activities without incident.  SIGNATURES/CONFIDENTIALITY: You and/or your care partner have signed paperwork which will be entered into your electronic medical record.  These signatures attest to the fact that that the information above on your After Visit Summary has been reviewed and is understood.  Full responsibility of the confidentiality of this  discharge information lies with you and/or your care-partner.   Thank-you for choosing [facility] for your healthcare needs.

## 2012-03-24 NOTE — Progress Notes (Signed)
Called to room to assist during endoscopic procedure.  Patient ID and intended procedure confirmed with present staff. Received instructions for my participation in the procedure from the performing physician.  

## 2012-03-24 NOTE — Progress Notes (Addendum)
Patient did not have preoperative order for IV antibiotic SSI prophylaxis. (G8918)  Patient did not experience any of the following events: a burn prior to discharge; a fall within the facility; wrong site/side/patient/procedure/implant event; or a hospital transfer or hospital admission upon discharge from the facility. (G8907)  

## 2012-03-24 NOTE — Progress Notes (Signed)
VSS A&O x3 Pleased with MAC. Report to Mickle Mallory DRM

## 2012-03-25 ENCOUNTER — Telehealth: Payer: Self-pay | Admitting: *Deleted

## 2012-03-25 NOTE — Telephone Encounter (Signed)
  Follow up Call-  Call back number 03/24/2012 03/24/2012 01/06/2011  Post procedure Call Back phone  # call office after 8am  [phone] ok to leave a message  Permission to leave phone message - Yes -     Patient questions:  Do you have a fever, pain , or abdominal swelling? no Pain Score  0 *  Have you tolerated food without any problems? yes  Have you been able to return to your normal activities? yes  Do you have any questions about your discharge instructions: Diet   no Medications  no Follow up visit  no  Do you have questions or concerns about your Care? no  Actions: * If pain score is 4 or above: No action needed, pain <4.

## 2012-03-31 ENCOUNTER — Encounter: Payer: Self-pay | Admitting: Gastroenterology

## 2012-04-07 ENCOUNTER — Encounter: Payer: BC Managed Care – PPO | Admitting: Internal Medicine

## 2012-05-10 ENCOUNTER — Other Ambulatory Visit: Payer: Self-pay | Admitting: Internal Medicine

## 2012-05-11 ENCOUNTER — Telehealth: Payer: Self-pay | Admitting: Internal Medicine

## 2012-05-11 MED ORDER — AMLODIPINE BESYLATE 10 MG PO TABS: 10.0000 mg | ORAL_TABLET | Freq: Every day | ORAL | Status: DC

## 2012-05-11 MED ORDER — METOPROLOL SUCCINATE ER 50 MG PO TB24: 50.0000 mg | ORAL_TABLET | Freq: Every day | ORAL | Status: DC

## 2012-05-11 NOTE — Telephone Encounter (Signed)
Refilled pt's BP meds for 90day supply to [facility] since he has an upcoming appt with Dr Linda Hedges on 06/23/12 I called pt to notify him meds have been sent and to make sure he keeps his appt in April.

## 2012-05-11 NOTE — Telephone Encounter (Signed)
The patient called the triage line and is hoping to discuss her medications with the cma.  Her callback number is [phone]

## 2012-06-23 ENCOUNTER — Encounter: Payer: BC Managed Care – PPO | Admitting: Internal Medicine

## 2012-07-02 ENCOUNTER — Ambulatory Visit (INDEPENDENT_AMBULATORY_CARE_PROVIDER_SITE_OTHER): Payer: BC Managed Care – PPO | Admitting: Family Medicine

## 2012-07-02 ENCOUNTER — Encounter: Payer: Self-pay | Admitting: Family Medicine

## 2012-07-02 VITALS — BP 140/76 | Temp 98.6°F | Wt 238.0 lb

## 2012-07-02 DIAGNOSIS — J069 Acute upper respiratory infection, unspecified: Secondary | ICD-10-CM | POA: Insufficient documentation

## 2012-07-02 DIAGNOSIS — J45909 Unspecified asthma, uncomplicated: Secondary | ICD-10-CM

## 2012-07-02 MED ORDER — PREDNISONE 20 MG PO TABS: ORAL_TABLET | ORAL | Status: DC

## 2012-07-02 MED ORDER — HYDROCODONE-HOMATROPINE 5-1.5 MG/5ML PO SYRP: 5.0000 mL | ORAL_SOLUTION | Freq: Three times a day (TID) | ORAL | Status: DC | PRN

## 2012-07-02 NOTE — Patient Instructions (Addendum)
Take the prednisone as directed,,,,,,,,,,  Hydromet 1/2-1 teaspoon 3 times a day when necessary for cough and cold  Return when necessary

## 2012-07-02 NOTE — Progress Notes (Signed)
  Subjective:    Patient ID: Patrick Floyd, male    DOB: May 31, 1945, 67 y.o.   MRN: [id number]  HPI Patrick Floyd is a 67 year old married male nonsmoker who comes in today with a four-day history of head congestion fever of 180 in the legs and a cough.  He's otherwise been in excellent health he said no chronic health problems. He is a nonsmoker.     Review of Systems Review of systems negative except for allergic rhinitis for which he takes Benadryl    Objective:   Physical Exam  Well-developed well-nourished male no acute distress HEENT negative neck was supple no adenopathy lungs are clear except for some mild late expiratory wheezing      Assessment & Plan:  Viral syndrome with secondary asthma plan prednisone burst and taper Hydromet cough syrup when necessary for cold

## 2012-07-18 ENCOUNTER — Other Ambulatory Visit: Payer: Self-pay | Admitting: Internal Medicine

## 2012-08-03 ENCOUNTER — Telehealth: Payer: Self-pay | Admitting: Internal Medicine

## 2012-08-03 NOTE — Telephone Encounter (Signed)
error 

## 2012-08-05 ENCOUNTER — Other Ambulatory Visit: Payer: Self-pay | Admitting: Dermatology

## 2012-08-17 ENCOUNTER — Encounter: Payer: BC Managed Care – PPO | Admitting: Internal Medicine

## 2012-08-18 ENCOUNTER — Other Ambulatory Visit: Payer: Self-pay | Admitting: Dermatology

## 2012-08-24 ENCOUNTER — Other Ambulatory Visit (INDEPENDENT_AMBULATORY_CARE_PROVIDER_SITE_OTHER): Payer: BC Managed Care – PPO

## 2012-08-24 ENCOUNTER — Ambulatory Visit (INDEPENDENT_AMBULATORY_CARE_PROVIDER_SITE_OTHER): Payer: BC Managed Care – PPO | Admitting: Internal Medicine

## 2012-08-24 ENCOUNTER — Encounter: Payer: Self-pay | Admitting: Internal Medicine

## 2012-08-24 VITALS — BP 144/88 | HR 54 | Temp 98.0°F | Ht 73.0 in | Wt 236.0 lb

## 2012-08-24 DIAGNOSIS — E663 Overweight: Secondary | ICD-10-CM

## 2012-08-24 DIAGNOSIS — Z125 Encounter for screening for malignant neoplasm of prostate: Secondary | ICD-10-CM

## 2012-08-24 DIAGNOSIS — Z0000 Encounter for general adult medical examination without abnormal findings: Secondary | ICD-10-CM

## 2012-08-24 DIAGNOSIS — R7309 Other abnormal glucose: Secondary | ICD-10-CM

## 2012-08-24 DIAGNOSIS — K5289 Other specified noninfective gastroenteritis and colitis: Secondary | ICD-10-CM

## 2012-08-24 DIAGNOSIS — I10 Essential (primary) hypertension: Secondary | ICD-10-CM

## 2012-08-24 DIAGNOSIS — E785 Hyperlipidemia, unspecified: Secondary | ICD-10-CM

## 2012-08-24 LAB — COMPREHENSIVE METABOLIC PANEL
ALT: 29 U/L (ref 0–53)
AST: 17 U/L (ref 0–37)
Albumin: 4.3 g/dL (ref 3.5–5.2)
Alkaline Phosphatase: 133 U/L — ABNORMAL HIGH (ref 39–117)
BUN: 16 mg/dL (ref 6–23)
CO2: 30 mEq/L (ref 19–32)
Calcium: 12 mg/dL — ABNORMAL HIGH (ref 8.4–10.5)
Chloride: 100 mEq/L (ref 96–112)
Creatinine, Ser: 0.8 mg/dL (ref 0.4–1.5)
GFR: 106.32 mL/min (ref >60.00)
Glucose, Bld: 122 mg/dL — ABNORMAL HIGH (ref 70–99)
Potassium: 5.2 mEq/L — ABNORMAL HIGH (ref 3.5–5.1)
Sodium: 134 mEq/L — ABNORMAL LOW (ref 135–145)
Total Bilirubin: 1.4 mg/dL — ABNORMAL HIGH (ref 0.3–1.2)
Total Protein: 7.2 g/dL (ref 6.0–8.3)

## 2012-08-24 LAB — HEMOGLOBIN A1C: Hgb A1c MFr Bld: 6.4 % (ref 4.6–6.5)

## 2012-08-24 LAB — LIPID PANEL
Cholesterol: 229 mg/dL — ABNORMAL HIGH (ref 0–200)
HDL: 37.9 mg/dL — ABNORMAL LOW (ref >39.00)
Total CHOL/HDL Ratio: 6
Triglycerides: 151 mg/dL — ABNORMAL HIGH (ref 0.0–149.0)
VLDL: 30.2 mg/dL (ref 0.0–40.0)

## 2012-08-24 LAB — LDL CHOLESTEROL, DIRECT: Direct LDL: 166.7 mg/dL

## 2012-08-24 LAB — PSA: PSA: 0.98 ng/mL (ref 0.10–4.00)

## 2012-08-24 NOTE — Progress Notes (Signed)
Subjective:    Patient ID: Patrick Floyd, male    DOB: Aug 28, 1945, 67 y.o.   MRN: [id number]  HPI Patrick Floyd presents for general medical wellness exam. He had outpatient screening with LDL 153, glucose 135 otherwise ok. He has started going to the gym.  For several months he has had bilateral groin pain, like a muscle strain, with getting up from a chair or bed. No pain with walking or standing. No movement related pain.   A palpable mass right flank, like a bloated vein, that is not painful or limiting his activity.  He has nocturnal itching- a problem for several years that is relieved with benadryl.   Past Medical History  Diagnosis Date  . Seasonal allergies   . Hypertension   . Ulcerative colitis   . Hyperlipidemia   . Headache(784.0)   . Diverticulosis of colon   . Hemorrhoids, internal   . Hyperplastic colonic polyp   . IBS (irritable bowel syndrome)   . Anal fissure   . Cancer 1980's    basal cell   Past Surgical History  Procedure Laterality Date  . None     Family History  Problem Relation Age of Onset  . Colon cancer Neg Hx   . Stomach cancer Neg Hx   . Cancer Neg Hx     Colon and Prostate  . Hypertension Other   . Breast cancer Mother   . Heart disease Father    History   Social History  . Marital Status: Married    Spouse Name: N/A    Number of Children: N/A  . Years of Education: N/A   Occupational History  . Not on file.   Social History Main Topics  . Smoking status: Former Smoker    Quit date: 12/05/1979  . Smokeless tobacco: Never Used  . Alcohol Use: No     Comment: rare  . Drug Use: No  . Sexually Active: Not on file   Other Topics Concern  . Not on file   Social History Narrative   Forensic psychologist, 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 Marriage is in good health. Work is usual  amount of stress.     Current Outpatient Prescriptions on File Prior to Visit  Medication Sig Dispense Refill  . amLODipine (NORVASC) 10 MG tablet TAKE 1 TABLET DAILY. NEED TO SCHEDULE A PHYSICAL EXAM.  90 tablet  0  . diphenhydrAMINE (BENADRYL) 25 mg capsule Take 25 mg by mouth every 6 (six) hours as needed.        . metoprolol succinate (TOPROL-XL) 50 MG 24 hr tablet TAKE 1 TABLET DAILY. NEED TO MAKE YEARLY APPOINTMENT WITH DR. Linda Hedges FOR FURTHER REFILLS. LAST OFFICE VISIT 03/2010.  90 tablet  0  . predniSONE (DELTASONE) 20 MG tablet 2 tabs x3 days, 1 tab x3 days, a half a tab x3 days, then a half a tablet Monday Wednesday Friday for a two-week taper  30 tablet  1   No current facility-administered medications on file prior to visit.   \   Review of Systems Constitutional:  Negative for fever, chills, activity change and unexpected weight change.  HEENT:  Negative for hearing loss, ear pain, congestion, neck stiffness and postnasal drip. Negative for sore throat or swallowing problems. Negative for dental complaints.   Eyes: Negative for vision loss or change in visual  acuity.  Respiratory: Negative for chest tightness and wheezing. Negative for DOE.   Cardiovascular: Negative for chest pain or palpitations. No decreased exercise tolerance Gastrointestinal: No change in bowel habit. No bloating or gas. No reflux or indigestion Genitourinary: Negative for urgency, frequency, flank pain and difficulty urinating.  Musculoskeletal: Negative for myalgias, back pain, arthralgias and gait problem.  Neurological: Negative for dizziness, tremors, weakness and headaches.  Hematological: Negative for adenopathy.  Psychiatric/Behavioral: Negative for behavioral problems and dysphoric mood.       Objective:   Physical Exam Filed Vitals:   08/24/12 1111  BP: 144/88  Pulse: 54  Temp: 98 F (36.7 C)   Wt Readings from Last 3 Encounters:  08/24/12 236 lb (107.049 kg)  07/02/12 238 lb  (107.956 kg)  03/24/12 238 lb (107.956 kg)   Gen'l: Well nourished well developed white male in no acute distress  HEENT: Head: Normocephalic and atraumatic. Right Ear: External ear normal. EAC/TM nl. Left Ear: External ear normal.  EAC/TM nl. Nose: Nose normal. Mouth/Throat: Oropharynx is clear and moist. Dentition - native, in good repair. No buccal or palatal lesions. Posterior pharynx clear. Eyes: Conjunctivae and sclera clear. EOM intact. Pupils are equal, round, and reactive to light. Right eye exhibits no discharge. Left eye exhibits no discharge. Neck: Normal range of motion. Neck supple. No JVD present. No tracheal deviation present. No thyromegaly present.  Cardiovascular: Normal rate, regular rhythm, no gallop, no friction rub, no murmur heard.      Quiet precordium. 2+ radial and DP pulses . No carotid bruits Pulmonary/Chest: Effort normal. No respiratory distress or increased WOB, no wheezes, no rales. No chest wall deformity or CVAT. Abdomen: Soft. Bowel sounds are normal in all quadrants. He exhibits no distension, no rebound or guarding, tender to deep palpation  Bilateral lower quadrant in region of psoas; tender at right abd with deep subq band like structure.  No heptosplenomegaly  Genitourinary:   Musculoskeletal: Normal range of motion. He exhibits no edema and no tenderness.       Small and large joints without redness, synovial thickening or deformity. Full range of motion preserved about all small, median and large joints.  Lymphadenopathy:    He has no cervical or supraclavicular adenopathy.  Neurological: He is alert and oriented to person, place, and time. CN II-XII intact. DTRs 2+ and symmetrical biceps, radial and patellar tendons. Cerebellar function normal with no tremor, rigidity, normal gait and station.  Skin: Skin is warm and dry. No rash noted. No erythema.  Psychiatric: He has a normal mood and affect. His behavior is normal. Thought content normal.   Recent  Results (from the past 2160 hour(s))  LIPID PANEL     Status: Abnormal   Collection Time    08/24/12 12:18 PM      Result Value Range   Cholesterol 229 (*) 0 - 200 mg/dL   Comment: ATP III Classification       Desirable:  < 200 mg/dL               Borderline High:  200 - 239 mg/dL          High:  > = 240 mg/dL   Triglycerides 151.0 (*) 0.0 - 149.0 mg/dL   Comment: Normal:  <150 mg/dLBorderline High:  150 - 199 mg/dL   HDL 37.90 (*) >39.00 mg/dL   VLDL 30.2  0.0 - 40.0 mg/dL   Total CHOL/HDL Ratio 6     Comment:  Men          Women1/2 Average Risk     3.4          3.3Average Risk          5.0          4.42X Average Risk          9.6          7.13X Average Risk          15.0          11.0                      HEMOGLOBIN A1C     Status: None   Collection Time    08/24/12 12:18 PM      Result Value Range   Hemoglobin A1C 6.4  4.6 - 6.5 %   Comment: Glycemic Control Guidelines for People with Diabetes:Non Diabetic:  <6%Goal of Therapy: <7%Additional Action Suggested:  >8%   COMPREHENSIVE METABOLIC PANEL     Status: Abnormal   Collection Time    08/24/12 12:18 PM      Result Value Range   Sodium 134 (*) 135 - 145 mEq/L   Potassium 5.2 (*) 3.5 - 5.1 mEq/L   Chloride 100  96 - 112 mEq/L   CO2 30  19 - 32 mEq/L   Glucose, Bld 122 (*) 70 - 99 mg/dL   BUN 16  6 - 23 mg/dL   Creatinine, Ser 0.8  0.4 - 1.5 mg/dL   Total Bilirubin 1.4 (*) 0.3 - 1.2 mg/dL   Alkaline Phosphatase 133 (*) 39 - 117 U/L   AST 17  0 - 37 U/L   ALT 29  0 - 53 U/L   Total Protein 7.2  6.0 - 8.3 g/dL   Albumin 4.3  3.5 - 5.2 g/dL   Calcium 12.0 (*) 8.4 - 10.5 mg/dL   GFR 106.32  >60.00 mL/min  PSA     Status: None   Collection Time    08/24/12 12:18 PM      Result Value Range   PSA 0.98  0.10 - 4.00 ng/mL  LDL CHOLESTEROL, DIRECT     Status: None   Collection Time    08/24/12 12:18 PM      Result Value Range   Direct LDL 166.7     Comment: Optimal:  <100 mg/dLNear or Above Optimal:  100-129  mg/dLBorderline High:  130-159 mg/dLHigh:  160-189 mg/dLVery High:  >190 mg/dL          Assessment & Plan:

## 2012-08-24 NOTE — Patient Instructions (Addendum)
Thanks for coming in to see me. We will evaluate the cholesterol levels and the blood sugar. Lab results will be available on MyChart.  Lower abdominal pain - looks like chronic Psoas muscle strain: no evidence to suggest bladder, prostate or hip joints. Plan - deep massage: go to [email or web address] and search Psoas strain and massage.  Vein like structure right abdomen - watch for increase size, increased tenderness. If there is a change the best test will be CT  Return visit will depend on the lab results.   Full note will be mailed to you.

## 2012-08-25 ENCOUNTER — Encounter: Payer: Self-pay | Admitting: Internal Medicine

## 2012-08-26 ENCOUNTER — Other Ambulatory Visit: Payer: Self-pay | Admitting: Internal Medicine

## 2012-08-28 NOTE — Assessment & Plan Note (Signed)
In remission at this time w/o symptoms.  Plan Follow with GI as needed.

## 2012-08-28 NOTE — Assessment & Plan Note (Signed)
Body mass index is 31.14 kg/(m^2). = Obesity I  Plan Diet management: smart food choices, PORTION SIZE CONTROL, regular exercise. Goal - to loose 1-2 lbs.month. Target weight - 200 lbs

## 2012-08-28 NOTE — Assessment & Plan Note (Signed)
Lipid panel with elevated LDL above treatment threshold of 160 or greater  Plan ROV to discuss treatment options

## 2012-08-28 NOTE — Assessment & Plan Note (Signed)
Interval history w/o major illness or injury. A melanocytic mole on the back was excised with wide margins by Dr. Denna Haggard with bandage in place left scapular region. Follow up per Dr. Denna Haggard for suture removal. He c/o groin pain but exam is normal - suspect muscle strain. He has a palpable subcutaneous non-tender mass 1 x 4 cm right abdomen that is not worrisome but needs to be watched. Physical exam is otherwise normal. Lab results reviewed. He is current for colorectal cancer screening. Discussed pros and cons of prostate cancer screening (USPHCTF recommendations reviewed and [facility]U April '13 recommendations) and he requests evaluation at this time: PSA 0.98, unchanged from '11. Immunization are up to date except for pneumonia vaccine - to be given.  In summary -  A nice man who needs to loose weight and get better control of blood sugar. He also needs to reduce lipid levels and is to return to discuss treatment options.

## 2012-08-28 NOTE — Assessment & Plan Note (Signed)
BP Readings from Last 3 Encounters:  08/24/12 144/88  07/02/12 140/76  03/24/12 146/84   Borderline control on present medications.  Plan ROV to discuss modification of medical therapy.

## 2012-08-28 NOTE — Assessment & Plan Note (Signed)
Lab Results  Component Value Date   HGBA1C 6.4 08/24/2012   Just at normal limit (some diabetologist shoot for 6% or less).  Plan Life-style management: very little sugar and low carb diet  Regular exercise: 3 times a week for 30 minutes some form of exercise that maintains heart rate of 120+  Weight loss

## 2012-11-06 ENCOUNTER — Other Ambulatory Visit: Payer: Self-pay | Admitting: Internal Medicine

## 2012-12-21 ENCOUNTER — Telehealth: Payer: Self-pay | Admitting: Internal Medicine

## 2012-12-21 DIAGNOSIS — L989 Disorder of the skin and subcutaneous tissue, unspecified: Secondary | ICD-10-CM

## 2012-12-21 NOTE — Telephone Encounter (Signed)
Located patient's report and per Dr Linda Hedges he is to be set up for a CT scan abdomen w/o contrast to assess for possible tumor. Patient is aware.

## 2012-12-21 NOTE — Telephone Encounter (Signed)
Pt called stated that he saw Dr. Denna Haggard last week about the lump in his stomach and Dr. Denna Haggard stated that he would send Dr. Linda Hedges a letter concern about this. Pt was wondering if the letter was received and pt stated that Dr. Denna Haggard thinks that he need to be refer to radiologist for an MIR or CAT scan. Please advise.

## 2012-12-21 NOTE — Telephone Encounter (Signed)
Not in EPIC - I do have incoming correspondence backed up for several days. Will be on the look out

## 2012-12-22 ENCOUNTER — Other Ambulatory Visit: Payer: Self-pay | Admitting: Internal Medicine

## 2012-12-22 DIAGNOSIS — L989 Disorder of the skin and subcutaneous tissue, unspecified: Secondary | ICD-10-CM

## 2012-12-23 ENCOUNTER — Ambulatory Visit (INDEPENDENT_AMBULATORY_CARE_PROVIDER_SITE_OTHER)
Admission: RE | Admit: 2012-12-23 | Discharge: 2012-12-23 | Disposition: A | Discharge status: Home / Self Care | Payer: BC Managed Care – PPO | Source: Ambulatory Visit | Attending: Internal Medicine | Admitting: Internal Medicine

## 2012-12-23 ENCOUNTER — Telehealth: Payer: Self-pay

## 2012-12-23 DIAGNOSIS — L989 Disorder of the skin and subcutaneous tissue, unspecified: Secondary | ICD-10-CM

## 2012-12-23 MED ORDER — IOHEXOL 300 MG/ML  SOLN
100.0000 mL | Freq: Once | INTRAMUSCULAR | Status: AC | PRN
  Administered 2012-12-23: 100 mL via INTRAVENOUS

## 2012-12-23 NOTE — Telephone Encounter (Signed)
There is no definite evidence of acute abnormality  The final results should be interpreted by Dr Linda Hedges who would be more aware of the clinical exam and reason for the test

## 2012-12-23 NOTE — Telephone Encounter (Signed)
Phone call from patient requesting the results of his CT scan he had done this morning. (MEN patient)

## 2012-12-23 NOTE — Telephone Encounter (Signed)
Discussed with pt

## 2012-12-26 ENCOUNTER — Encounter: Payer: Self-pay | Admitting: Internal Medicine

## 2012-12-27 ENCOUNTER — Other Ambulatory Visit: Payer: BC Managed Care – PPO

## 2012-12-27 ENCOUNTER — Telehealth: Payer: Self-pay

## 2012-12-27 NOTE — Telephone Encounter (Signed)
Called pt to schedule apt, no answer, lvmom

## 2012-12-27 NOTE — Telephone Encounter (Signed)
Message copied by Richrd Sox on Tue Dec 27, 2012  4:27 PM ------      Message from: Neena Rhymes      Created: Mon Dec 26, 2012 12:57 PM       Patient had wanted a phone call to explain CT abdomen. I offered to see him in the office. Please call and see if he would like an appointment this week.             He has been abrupt on the phone with Lysbeth Penner.            Thanks ------

## 2012-12-28 NOTE — Telephone Encounter (Signed)
Pt return your call, we offer an appt with Dr. Linda Hedges this week. Pt stated he will be out of town and he will call back.

## 2013-01-05 ENCOUNTER — Other Ambulatory Visit: Payer: Self-pay

## 2013-04-28 ENCOUNTER — Telehealth: Payer: Self-pay

## 2013-04-28 NOTE — Telephone Encounter (Signed)
The patient called and is hoping to get your advice on who to see as his pcp.

## 2013-04-29 NOTE — Telephone Encounter (Signed)
Dr Jenny Reichmann

## 2013-10-30 ENCOUNTER — Telehealth: Payer: Self-pay | Admitting: *Deleted

## 2013-10-30 MED ORDER — METOPROLOL SUCCINATE ER 50 MG PO TB24: ORAL_TABLET | ORAL | Status: DC

## 2013-10-30 MED ORDER — AMLODIPINE BESYLATE 10 MG PO TABS: ORAL_TABLET | ORAL | Status: DC

## 2013-10-30 NOTE — Telephone Encounter (Signed)
Pt made appt to see Dr. Doug Sou. appt not until 03/07/13. Inform pt will send [facility]rolol [facility]...Johny Chess

## 2013-10-30 NOTE — Telephone Encounter (Signed)
Left msg on triage stating need to get set-up with another md since Dr. Linda Hedges has retired. Called pt back no answer LMOM call office to set up appt with new md "Kollar"...Patrick Floyd

## 2013-11-07 ENCOUNTER — Encounter: Payer: Self-pay | Admitting: Gastroenterology

## 2013-12-04 ENCOUNTER — Telehealth: Payer: Self-pay | Admitting: Gastroenterology

## 2013-12-04 MED ORDER — MESALAMINE 400 MG PO CPDR: 1600.0000 mg | DELAYED_RELEASE_CAPSULE | Freq: Three times a day (TID) | ORAL | Status: DC

## 2013-12-04 NOTE — Telephone Encounter (Signed)
Sent med to [facility]     Tried to call pt but no answer

## 2013-12-07 ENCOUNTER — Telehealth: Payer: Self-pay | Admitting: Gastroenterology

## 2013-12-08 NOTE — Telephone Encounter (Signed)
This has been done, Explained to patient that this was sent in 90 day on the 5th

## 2014-02-27 ENCOUNTER — Ambulatory Visit: Payer: BC Managed Care – PPO | Admitting: Internal Medicine

## 2014-03-07 ENCOUNTER — Encounter: Payer: Self-pay | Admitting: Internal Medicine

## 2014-03-07 ENCOUNTER — Ambulatory Visit (INDEPENDENT_AMBULATORY_CARE_PROVIDER_SITE_OTHER): Payer: BLUE CROSS/BLUE SHIELD | Admitting: Internal Medicine

## 2014-03-07 ENCOUNTER — Other Ambulatory Visit (INDEPENDENT_AMBULATORY_CARE_PROVIDER_SITE_OTHER): Payer: BLUE CROSS/BLUE SHIELD

## 2014-03-07 VITALS — BP 136/86 | HR 68 | Temp 98.0°F | Resp 16 | Ht 73.0 in | Wt 242.4 lb

## 2014-03-07 DIAGNOSIS — I10 Essential (primary) hypertension: Secondary | ICD-10-CM

## 2014-03-07 DIAGNOSIS — M1610 Unilateral primary osteoarthritis, unspecified hip: Secondary | ICD-10-CM

## 2014-03-07 DIAGNOSIS — Z0000 Encounter for general adult medical examination without abnormal findings: Secondary | ICD-10-CM

## 2014-03-07 DIAGNOSIS — R7301 Impaired fasting glucose: Secondary | ICD-10-CM

## 2014-03-07 DIAGNOSIS — K5190 Ulcerative colitis, unspecified, without complications: Secondary | ICD-10-CM

## 2014-03-07 DIAGNOSIS — M1990 Unspecified osteoarthritis, unspecified site: Secondary | ICD-10-CM

## 2014-03-07 LAB — BASIC METABOLIC PANEL
BUN: 19 mg/dL (ref 6–23)
CO2: 30 mEq/L (ref 19–32)
Calcium: 11.7 mg/dL — ABNORMAL HIGH (ref 8.4–10.5)
Chloride: 106 mEq/L (ref 96–112)
Creatinine, Ser: 0.8 mg/dL (ref 0.4–1.5)
GFR: 98.5 mL/min (ref >60.00)
Glucose, Bld: 149 mg/dL — ABNORMAL HIGH (ref 70–99)
Potassium: 5.1 mEq/L (ref 3.5–5.1)
Sodium: 140 mEq/L (ref 135–145)

## 2014-03-07 LAB — LIPID PANEL
Cholesterol: 245 mg/dL — ABNORMAL HIGH (ref 0–200)
HDL: 36.1 mg/dL — ABNORMAL LOW (ref >39.00)
LDL Cholesterol: 174 mg/dL — ABNORMAL HIGH (ref 0–99)
NonHDL: 208.9
Total CHOL/HDL Ratio: 7
Triglycerides: 176 mg/dL — ABNORMAL HIGH (ref 0.0–149.0)
VLDL: 35.2 mg/dL (ref 0.0–40.0)

## 2014-03-07 LAB — HEMOGLOBIN A1C: Hgb A1c MFr Bld: 6.8 % — ABNORMAL HIGH (ref 4.6–6.5)

## 2014-03-07 NOTE — Patient Instructions (Signed)
We will check your blood work today and call you back or send you a message on mychart with the results. If you have any questions about them please feel free to ask or send [place] a message back.  We will look back through the records to see if you have already had your pneumonia shot.   Work on exercising consistently to keep yourself healthy.   Health Maintenance A healthy lifestyle and preventative care can promote health and wellness.  Maintain regular health, dental, and eye exams.  Eat a healthy diet. Foods like vegetables, fruits, whole grains, low-fat dairy products, and lean protein foods contain the nutrients you need and are low in calories. Decrease your intake of foods high in solid fats, added sugars, and salt. Get information about a proper diet from your health care provider, if necessary.  Regular physical exercise is one of the most important things you can do for your health. Most adults should get at least 150 minutes of moderate-intensity exercise (any activity that increases your heart rate and causes you to sweat) each week. In addition, most adults need muscle-strengthening exercises on 2 or more days a week.   Maintain a healthy weight. The body mass index (BMI) is a screening tool to identify possible weight problems. It provides an estimate of body fat based on height and weight. Your health care provider can find your BMI and can help you achieve or maintain a healthy weight. For males 20 years and older:  A BMI below 18.5 is considered underweight.  A BMI of 18.5 to 24.9 is normal.  A BMI of 25 to 29.9 is considered overweight.  A BMI of 30 and above is considered obese.  Maintain normal blood lipids and cholesterol by exercising and minimizing your intake of saturated fat. Eat a balanced diet with plenty of fruits and vegetables. Blood tests for lipids and cholesterol should begin at age 76 and be repeated every 5 years. If your lipid or cholesterol levels are  high, you are over age 54, or you are at high risk for heart disease, you may need your cholesterol levels checked more frequently.Ongoing high lipid and cholesterol levels should be treated with medicines if diet and exercise are not working.  If you smoke, find out from your health care provider how to quit. If you do not use tobacco, do not start.  Lung cancer screening is recommended for adults aged 63-80 years who are at high risk for developing lung cancer because of a history of smoking. A yearly low-dose CT scan of the lungs is recommended for people who have at least a 30-pack-year history of smoking and are current smokers or have quit within the past 15 years. A pack year of smoking is smoking an average of 1 pack of cigarettes a day for 1 year (for example, a 30-pack-year history of smoking could mean smoking 1 pack a day for 30 years or 2 packs a day for 15 years). Yearly screening should continue until the smoker has stopped smoking for at least 15 years. Yearly screening should be stopped for people who develop a health problem that would prevent them from having lung cancer treatment.  If you choose to drink alcohol, do not have more than 2 drinks per day. One drink is considered to be 12 oz (360 mL) of beer, 5 oz (150 mL) of wine, or 1.5 oz (45 mL) of liquor.  Avoid the use of street drugs. Do not share needles with  anyone. Ask for help if you need support or instructions about stopping the use of drugs.  High blood pressure causes heart disease and increases the risk of stroke. Blood pressure should be checked at least every 1-2 years. Ongoing high blood pressure should be treated with medicines if weight loss and exercise are not effective.  If you are 13-15 years old, ask your health care provider if you should take aspirin to prevent heart disease.  Diabetes screening involves taking a blood sample to check your fasting blood sugar level. This should be done once every 3 years  after age 26 if you are at a normal weight and without risk factors for diabetes. Testing should be considered at a younger age or be carried out more frequently if you are overweight and have at least 1 risk factor for diabetes.  Colorectal cancer can be detected and often prevented. Most routine colorectal cancer screening begins at the age of 14 and continues through age 75. However, your health care provider may recommend screening at an earlier age if you have risk factors for colon cancer. On a yearly basis, your health care provider may provide home test kits to check for hidden blood in the stool. A small camera at the end of a tube may be used to directly examine the colon (sigmoidoscopy or colonoscopy) to detect the earliest forms of colorectal cancer. Talk to your health care provider about this at age 10 when routine screening begins. A direct exam of the colon should be repeated every 5-10 years through age 72, unless early forms of precancerous polyps or small growths are found.  People who are at an increased risk for hepatitis B should be screened for this virus. You are considered at high risk for hepatitis B if:  You were born in a country where hepatitis B occurs often. Talk with your health care provider about which countries are considered high risk.  Your parents were born in a high-risk country and you have not received a shot to protect against hepatitis B (hepatitis B vaccine).  You have HIV or AIDS.  You use needles to inject street drugs.  You live with, or have sex with, someone who has hepatitis B.  You are a man who has sex with other men (MSM).  You get hemodialysis treatment.  You take certain medicines for conditions like cancer, organ transplantation, and autoimmune conditions.  Hepatitis C blood testing is recommended for all people born from [birth year] through 1963 and any individual with known risk factors for hepatitis C.  Healthy men should no longer receive  prostate-specific antigen (PSA) blood tests as part of routine cancer screening. Talk to your health care provider about prostate cancer screening.  Testicular cancer screening is not recommended for adolescents or adult males who have no symptoms. Screening includes self-exam, a health care provider exam, and other screening tests. Consult with your health care provider about any symptoms you have or any concerns you have about testicular cancer.  Practice safe sex. Use condoms and avoid high-risk sexual practices to reduce the spread of sexually transmitted infections (STIs).  You should be screened for STIs, including gonorrhea and chlamydia if:  You are sexually active and are younger than 24 years.  You are older than 24 years, and your health care provider tells you that you are at risk for this type of infection.  Your sexual activity has changed since you were last screened, and you are at an increased risk for  chlamydia or gonorrhea. Ask your health care provider if you are at risk.  If you are at risk of being infected with HIV, it is recommended that you take a prescription medicine daily to prevent HIV infection. This is called pre-exposure prophylaxis (PrEP). You are considered at risk if:  You are a man who has sex with other men (MSM).  You are a heterosexual man who is sexually active with multiple partners.  You take drugs by injection.  You are sexually active with a partner who has HIV.  Talk with your health care provider about whether you are at high risk of being infected with HIV. If you choose to begin PrEP, you should first be tested for HIV. You should then be tested every 3 months for as long as you are taking PrEP.  Use sunscreen. Apply sunscreen liberally and repeatedly throughout the day. You should seek shade when your shadow is shorter than you. Protect yourself by wearing long sleeves, pants, a wide-brimmed hat, and sunglasses year round whenever you are  outdoors.  Tell your health care provider of new moles or changes in moles, especially if there is a change in shape or color. Also, tell your health care provider if a mole is larger than the size of a pencil eraser.  A one-time screening for abdominal aortic aneurysm (AAA) and surgical repair of large AAAs by ultrasound is recommended for men aged 42-75 years who are current or former smokers.  Stay current with your vaccines (immunizations). Document Released: 08/15/2007 Document Revised: 02/21/2013 Document Reviewed: 07/14/2010 [facility] Patient Information 2015 [place], [place]. This information is not intended to replace advice given to you by your health care provider. Make sure you discuss any questions you have with your health care provider.

## 2014-03-07 NOTE — Progress Notes (Signed)
Pre visit review using our clinic review tool, if applicable. No additional management support is needed unless otherwise documented below in the visit note. 

## 2014-03-08 DIAGNOSIS — M1610 Unilateral primary osteoarthritis, unspecified hip: Secondary | ICD-10-CM | POA: Insufficient documentation

## 2014-03-08 NOTE — Progress Notes (Signed)
   Subjective:    Patient ID: Patrick Floyd, male    DOB: 1945-03-30, 69 y.o.   MRN: [id number]  HPI The patient is a 69 YO man who is coming in for a physical. He has PMH of ulcerative colitis and hypertension. He is doing fairly well but is having some hip discomfort. Whenever he stands up after sitting for long periods of time he has a twinge of pain. He has not needed any pain medication for it and it has been present for about 3 months. He did not have any injury or strain prior to the start of the pains.   Review of Systems  Constitutional: Negative for fever, activity change, appetite change, fatigue and unexpected weight change.  HENT: Negative.   Respiratory: Negative for cough, chest tightness, shortness of breath and wheezing.   Cardiovascular: Negative for chest pain, palpitations and leg swelling.  Gastrointestinal: Negative for nausea, abdominal pain, diarrhea, constipation and abdominal distention.  Musculoskeletal: Positive for arthralgias. Negative for myalgias, back pain and gait problem.  Skin: Negative.   Neurological: Negative.   Psychiatric/Behavioral: Negative.       Objective:   Physical Exam  Constitutional: He is oriented to person, place, and time. He appears well-developed and well-nourished.  HENT:  Head: Normocephalic and atraumatic.  Eyes: EOM are normal.  Neck: Normal range of motion.  Cardiovascular: Normal rate and regular rhythm.   Pulmonary/Chest: Effort normal and breath sounds normal.  Abdominal: Soft. Bowel sounds are normal. He exhibits no distension. There is no tenderness.  Musculoskeletal: He exhibits no edema or tenderness.  No tenderness in the left groin or the outside of the thigh.  Neurological: He is alert and oriented to person, place, and time. Coordination normal.  Skin: Skin is warm and dry.  Psychiatric: He has a normal mood and affect. His behavior is normal.   Filed Vitals:   03/07/14 0906  BP: 136/86  Pulse: 68  Temp:  98 F (36.7 C)  TempSrc: Oral  Resp: 16  Height: 6' 1"  (1.854 m)  Weight: 242 lb 6.4 oz (109.952 kg)  SpO2: 97%      Assessment & Plan:

## 2014-03-08 NOTE — Assessment & Plan Note (Signed)
Amlodipine and metoprolol doing well for BP and he continues to exercise some.

## 2014-03-08 NOTE — Assessment & Plan Note (Signed)
Last colonoscopy last year and now going every 2 years. Thinks he has already had pneumonia shot and will check records. Had shingles already.

## 2014-03-08 NOTE — Assessment & Plan Note (Signed)
Check HgA1c today to ensure not rising.

## 2014-03-08 NOTE — Assessment & Plan Note (Signed)
Minimal symptoms at present. Mild ache for 1-2 minutes after standing. Will continue to observe and if progressing will order x-ray.

## 2014-03-08 NOTE — Assessment & Plan Note (Signed)
Continues to do well on mesalamine for 10-20+ years.

## 2014-03-10 ENCOUNTER — Encounter: Payer: Self-pay | Admitting: Internal Medicine

## 2014-03-10 DIAGNOSIS — Z0000 Encounter for general adult medical examination without abnormal findings: Secondary | ICD-10-CM

## 2014-03-16 ENCOUNTER — Encounter: Payer: Self-pay | Admitting: Gastroenterology

## 2014-05-13 ENCOUNTER — Other Ambulatory Visit: Payer: Self-pay | Admitting: Internal Medicine

## 2014-05-14 ENCOUNTER — Telehealth: Payer: Self-pay | Admitting: Internal Medicine

## 2014-05-14 NOTE — Telephone Encounter (Signed)
Pt called in for his r

## 2014-11-11 ENCOUNTER — Other Ambulatory Visit: Payer: Self-pay | Admitting: Internal Medicine

## 2015-02-03 ENCOUNTER — Other Ambulatory Visit: Payer: Self-pay | Admitting: Internal Medicine

## 2015-03-03 HISTORY — PX: POLYPECTOMY: SHX149

## 2015-08-17 DIAGNOSIS — T63441A Toxic effect of venom of bees, accidental (unintentional), initial encounter: Secondary | ICD-10-CM | POA: Diagnosis not present

## 2015-08-17 DIAGNOSIS — I10 Essential (primary) hypertension: Secondary | ICD-10-CM | POA: Diagnosis not present

## 2015-08-22 ENCOUNTER — Telehealth: Payer: Self-pay | Admitting: Gastroenterology

## 2015-08-22 NOTE — Telephone Encounter (Signed)
OK 

## 2015-08-22 NOTE — Telephone Encounter (Signed)
Patient would like to come in for a follow up appointment,and previously was a Deatra Ina patient but is requesting to see Dr. Fuller Plan. Dr Fuller Plan will you accept patient?

## 2015-08-23 NOTE — Telephone Encounter (Signed)
Patient has been scheduled for follow up 10/16/2015.

## 2015-10-16 ENCOUNTER — Telehealth: Payer: Self-pay | Admitting: Internal Medicine

## 2015-10-16 ENCOUNTER — Ambulatory Visit (INDEPENDENT_AMBULATORY_CARE_PROVIDER_SITE_OTHER): Payer: BLUE CROSS/BLUE SHIELD | Admitting: Gastroenterology

## 2015-10-16 ENCOUNTER — Encounter: Payer: Self-pay | Admitting: Gastroenterology

## 2015-10-16 VITALS — BP 144/80 | HR 68 | Ht 71.75 in | Wt 237.2 lb

## 2015-10-16 DIAGNOSIS — K5130 Ulcerative (chronic) rectosigmoiditis without complications: Secondary | ICD-10-CM

## 2015-10-16 DIAGNOSIS — Z86010 Personal history of colonic polyps: Secondary | ICD-10-CM

## 2015-10-16 DIAGNOSIS — K219 Gastro-esophageal reflux disease without esophagitis: Secondary | ICD-10-CM

## 2015-10-16 DIAGNOSIS — Z860101 Personal history of adenomatous and serrated colon polyps: Secondary | ICD-10-CM

## 2015-10-16 MED ORDER — MESALAMINE 1.2 G PO TBEC: 2.4000 g | DELAYED_RELEASE_TABLET | Freq: Every day | ORAL | 11 refills | Status: DC

## 2015-10-16 MED ORDER — NA SULFATE-K SULFATE-MG SULF 17.5-3.13-1.6 GM/177ML PO SOLN: 1.0000 | Freq: Once | ORAL | 0 refills | Status: AC

## 2015-10-16 MED ORDER — MESALAMINE 1.2 G PO TBEC: 2.4000 g | DELAYED_RELEASE_TABLET | Freq: Every day | ORAL | 3 refills | Status: DC

## 2015-10-16 NOTE — Progress Notes (Signed)
    History of Present Illness: This is a 70 year old male returning for follow up of left sided ulcerative colitis with new reflux symptoms. He was previously followed by Dr. Deatra Ina. His most recent colonoscopy was performed in January 2014 was unremarkable including surveillance biopsies. Relates occasional heartburn symptoms occurring about every 2-3 weeks for the past several months. He relates no ongoing gastrointestinal complaints. Denies weight loss, abdominal pain, constipation, diarrhea, change in stool caliber, melena, hematochezia, nausea, vomiting, dysphagia, chest pain.  Review of Systems: Pertinent positive and negative review of systems were noted in the above HPI section. All other review of systems were otherwise negative.  Current Medications, Allergies, Past Medical History, Past Surgical History, Family History and Social History were reviewed in [employer] record.  Physical Exam: General: Well developed, well nourished, no acute distress Head: Normocephalic and atraumatic Eyes:  sclerae anicteric, EOMI Ears: Normal auditory acuity Mouth: No deformity or lesions Neck: Supple, no masses or thyromegaly Lungs: Clear throughout to auscultation Heart: Regular rate and rhythm; no murmurs, rubs or bruits Abdomen: Soft, non tender and non distended. No masses, hepatosplenomegaly or hernias noted. Normal Bowel sounds Rectal: Deferred to colonoscopy Musculoskeletal: Symmetrical with no gross deformities  Skin: No lesions on visible extremities Pulses:  Normal pulses noted Extremities: No clubbing, cyanosis, edema or deformities noted Neurological: Alert oriented x 4, grossly nonfocal Cervical Nodes:  No significant cervical adenopathy Inguinal Nodes: No significant inguinal adenopathy Psychological:  Alert and cooperative. Normal mood and affect  Assessment and Recommendations:  1. Left sided UC. Continue Lialda 2.4 g daily. He is due for surveillance  colonoscopy. The risks (including bleeding, perforation, infection, missed lesions, medication reactions and possible hospitalization or surgery if complications occur), benefits, and alternatives to colonoscopy with possible biopsy and possible polypectomy were discussed with the patient and they consent to proceed.   2. Personal history of adenomatous colon polyps. Surveillance colonoscopy as above.  3. GERD, mild. Antireflux measures. TUMS when necessary. Pepcid ac when necessary. If symptoms become more frequent or more severe he is advised to call for further evaluation

## 2015-10-16 NOTE — Telephone Encounter (Signed)
Unfortunately, I'm not able to accept any more new patients at this time.  I'm sorry! Thank you!  

## 2015-10-16 NOTE — Patient Instructions (Signed)
We have sent the following medications to your pharmacy for you to pick up at your convenience: [place].   Patient advised to avoid spicy, acidic, citrus, chocolate, mints, fruit and fruit juices.  Limit the intake of caffeine, alcohol and Soda.  Don't exercise too soon after eating.  Don't lie down within 3-4 hours of eating.  Elevate the head of your bed.  You have been scheduled for a colonoscopy. Please follow written instructions given to you at your visit today.  Please pick up your prep supplies at the pharmacy within the next 1-3 days. If you use inhalers (even only as needed), please bring them with you on the day of your procedure. Your physician has requested that you go to www.startemmi.com and enter the access code given to you at your visit today. This web site gives a general overview about your procedure. However, you should still follow specific instructions given to you by our office regarding your preparation for the procedure.  Thank you for choosing me and [facility].  Pricilla Riffle. Dagoberto Ligas., MD., [facility] Regal '

## 2015-10-16 NOTE — Telephone Encounter (Signed)
Pt  Came by and asked if he could switch to Dr Camila Li for his primary.  Is this ok with both of you?

## 2015-10-16 NOTE — Telephone Encounter (Signed)
Fine with me

## 2015-12-11 ENCOUNTER — Encounter: Payer: Self-pay | Admitting: Gastroenterology

## 2015-12-24 ENCOUNTER — Encounter: Payer: Self-pay | Admitting: Gastroenterology

## 2015-12-24 ENCOUNTER — Ambulatory Visit (AMBULATORY_SURGERY_CENTER): Payer: BLUE CROSS/BLUE SHIELD | Admitting: Gastroenterology

## 2015-12-24 VITALS — BP 145/83 | HR 67 | Temp 98.9°F | Resp 12 | Ht 71.0 in | Wt 237.0 lb

## 2015-12-24 DIAGNOSIS — D124 Benign neoplasm of descending colon: Secondary | ICD-10-CM | POA: Diagnosis not present

## 2015-12-24 DIAGNOSIS — D123 Benign neoplasm of transverse colon: Secondary | ICD-10-CM

## 2015-12-24 DIAGNOSIS — Z86010 Personal history of colonic polyps: Secondary | ICD-10-CM

## 2015-12-24 DIAGNOSIS — K5190 Ulcerative colitis, unspecified, without complications: Secondary | ICD-10-CM | POA: Diagnosis not present

## 2015-12-24 DIAGNOSIS — K5130 Ulcerative (chronic) rectosigmoiditis without complications: Secondary | ICD-10-CM

## 2015-12-24 MED ORDER — SODIUM CHLORIDE 0.9 % IV SOLN: 500.0000 mL | INTRAVENOUS | Status: DC

## 2015-12-24 NOTE — Patient Instructions (Signed)
Instructions/Recommendations:  Polyps handout given to patient. Diverticulosis handout given.  Repeat colonoscopy in 3 years (2020) for surveillance.  YOU HAD AN ENDOSCOPIC PROCEDURE TODAY AT [facility]:   Refer to the procedure report that was given to you for any specific questions about what was found during the examination.  If the procedure report does not answer your questions, please call your gastroenterologist to clarify.  If you requested that your care partner not be given the details of your procedure findings, then the procedure report has been included in a sealed envelope for you to review at your convenience later.  YOU SHOULD EXPECT: Some feelings of bloating in the abdomen. Passage of more gas than usual.  Walking can help get rid of the air that was put into your GI tract during the procedure and reduce the bloating. If you had a lower endoscopy (such as a colonoscopy or flexible sigmoidoscopy) you may notice spotting of blood in your stool or on the toilet paper. If you underwent a bowel prep for your procedure, you may not have a normal bowel movement for a few days.  Please Note:  You might notice some irritation and congestion in your nose or some drainage.  This is from the oxygen used during your procedure.  There is no need for concern and it should clear up in a day or so.  SYMPTOMS TO REPORT IMMEDIATELY:   Following lower endoscopy (colonoscopy or flexible sigmoidoscopy):  Excessive amounts of blood in the stool  Significant tenderness or worsening of abdominal pains  Swelling of the abdomen that is new, acute  Fever of 100F or higher For urgent or emergent issues, a gastroenterologist can be reached at any hour by calling [phone].   DIET:  We do recommend a small meal at first, but then you may proceed to your regular diet.  Drink plenty of fluids but you should avoid alcoholic beverages for 24 hours.  ACTIVITY:  You should plan to take  it easy for the rest of today and you should NOT DRIVE or use heavy machinery until tomorrow (because of the sedation medicines used during the test).    FOLLOW UP: Our staff will call the number listed on your records the next business day following your procedure to check on you and address any questions or concerns that you may have regarding the information given to you following your procedure. If we do not reach you, we will leave a message.  However, if you are feeling well and you are not experiencing any problems, there is no need to return our call.  We will assume that you have returned to your regular daily activities without incident.  If any biopsies were taken you will be contacted by phone or by letter within the next 1-3 weeks.  Please call us at [phone] if you have not heard about the biopsies in 3 weeks.    SIGNATURES/CONFIDENTIALITY: You and/or your care partner have signed paperwork which will be entered into your electronic medical record.  These signatures attest to the fact that that the information above on your After Visit Summary has been reviewed and is understood.  Full responsibility of the confidentiality of this discharge information lies with you and/or your care-partner.

## 2015-12-24 NOTE — Progress Notes (Signed)
Called to room to assist during endoscopic procedure.  Patient ID and intended procedure confirmed with present staff. Received instructions for my participation in the procedure from the performing physician.  

## 2015-12-24 NOTE — Op Note (Signed)
[facility] Patient Name: Patrick Floyd Procedure Date: 12/24/2015 9:27 AM MRN: [id number] Endoscopist: Ladene Artist , MD Age: 70 Referring MD:  Date of Birth: [birth year] Gender: Male Account #: [id number] Procedure:                Colonoscopy Indications:              High risk colon cancer surveillance: Ulcerative                            left sided colitis of 15 (or more) years duration Medicines:                Monitored Anesthesia Care Procedure:                Pre-Anesthesia Assessment:                           - Prior to the procedure, a History and Physical                            was performed, and patient medications and                            allergies were reviewed. The patient's tolerance of                            previous anesthesia was also reviewed. The risks                            and benefits of the procedure and the sedation                            options and risks were discussed with the patient.                            All questions were answered, and informed consent                            was obtained. Prior Anticoagulants: The patient has                            taken no previous anticoagulant or antiplatelet                            agents. ASA Grade Assessment: II - A patient with                            mild systemic disease. After reviewing the risks                            and benefits, the patient was deemed in                            satisfactory condition to undergo the procedure.  After obtaining informed consent, the colonoscope                            was passed under direct vision. Throughout the                            procedure, the patient's blood pressure, pulse, and                            oxygen saturations were monitored continuously. The                            Model PCF-[id number]) scope was introduced                            through  the anus and advanced to the the cecum,                            identified by appendiceal orifice and ileocecal                            valve. The ileocecal valve, appendiceal orifice,                            and rectum were photographed. The quality of the                            bowel preparation was good. The colonoscopy was                            performed without difficulty. The patient tolerated                            the procedure well. Scope In: 9:38:08 AM Scope Out: 9:58:39 AM Scope Withdrawal Time: 0 hours 17 minutes 2 seconds  Total Procedure Duration: 0 hours 20 minutes 31 seconds  Findings:                 The perianal and digital rectal examinations were                            normal.                           Two sessile polyps were found in the descending                            colon and hepatic flexure. The polyps were 6 mm in                            size. These polyps were removed with a cold snare.                            Resection and retrieval were complete.  Many medium-mouthed diverticula were found in the                            sigmoid colon. There was narrowing of the colon in                            association with the diverticular opening. There                            was evidence of diverticular spasm. Erythema was                            seen in association with the diverticular opening.                            There was no evidence of diverticular bleeding.                           The exam was otherwise without abnormality on                            direct and retroflexion views. Random biopsies                            taken throughout the colon. Complications:            No immediate complications. Estimated blood loss:                            None. Estimated Blood Loss:     Estimated blood loss: none. Impression:               - Two 6 mm polyps in the descending colon  and at                            the hepatic flexure, removed with a cold snare.                            Resected and retrieved.                           - Moderate diverticulosis in the sigmoid colon.                           - The examination was otherwise normal on direct                            and retroflexion views. Random biopsies obtained. Recommendation:           - Repeat colonoscopy in 3 years for surveillance.                           - Patient has a contact number available for  emergencies. The signs and symptoms of potential                            delayed complications were discussed with the                            patient. Return to normal activities tomorrow.                            Written discharge instructions were provided to the                            patient.                           - Resume previous diet.                           - Continue present medications.                           - Await pathology results. Ladene Artist, MD 12/24/2015 10:03:45 AM This report has been signed electronically.

## 2015-12-24 NOTE — Progress Notes (Signed)
Report given to PACU RN, vss

## 2015-12-25 ENCOUNTER — Telehealth: Payer: Self-pay | Admitting: *Deleted

## 2015-12-25 NOTE — Telephone Encounter (Signed)
  Follow up Call-  Call back number 12/24/2015  Post procedure Call Back phone  # [phone]  Permission to leave phone message Yes  Some recent data might be hidden     Patient questions:  Do you have a fever, pain , or abdominal swelling? No. Pain Score  0 *  Have you tolerated food without any problems? Yes.    Have you been able to return to your normal activities? Yes.    Do you have any questions about your discharge instructions: Diet   No. Medications  No. Follow up visit  No.  Do you have questions or concerns about your Care? No.  Actions: * If pain score is 4 or above: No action needed, pain <4.

## 2016-01-01 ENCOUNTER — Encounter: Payer: Self-pay | Admitting: Gastroenterology

## 2016-01-08 ENCOUNTER — Encounter: Payer: Self-pay | Admitting: Gastroenterology

## 2016-01-28 ENCOUNTER — Telehealth: Payer: Self-pay | Admitting: Emergency Medicine

## 2016-01-28 NOTE — Telephone Encounter (Signed)
Unfortunately, I'm not able to accept any more new patients at this time.  I'm sorry! Thank you!  

## 2016-01-28 NOTE — Telephone Encounter (Signed)
Pts wife called and wants to know if her husband can switch from Dr Sharlet Salina to Dr Camila Li. He doesn't feel like he is connecting with Dr Sharlet Salina. Are you both ok with the switch?

## 2016-01-28 NOTE — Telephone Encounter (Signed)
Fine with me

## 2016-01-29 NOTE — Telephone Encounter (Signed)
Yes, I will see him.

## 2016-01-29 NOTE — Telephone Encounter (Signed)
Called patient wife back to let her know that Dr Camila Li is unable to accept patient at this time. She is now asking if you would be willing to except her husband as a transfer patient. She asked for me to tell you he used to be a patient of Dr Linda Hedges. Please advise thanks.

## 2016-01-30 ENCOUNTER — Telehealth: Payer: Self-pay | Admitting: Internal Medicine

## 2016-01-30 MED ORDER — METOPROLOL SUCCINATE ER 50 MG PO TB24: 50.0000 mg | ORAL_TABLET | Freq: Every day | ORAL | 0 refills | Status: DC

## 2016-01-30 MED ORDER — AMLODIPINE BESYLATE 10 MG PO TABS: 10.0000 mg | ORAL_TABLET | Freq: Every day | ORAL | 0 refills | Status: DC

## 2016-01-30 NOTE — Telephone Encounter (Signed)
LVm informing pt RXs have been sent to Mail order.

## 2016-01-30 NOTE — Telephone Encounter (Signed)
Are you okay with me

## 2016-01-30 NOTE — Telephone Encounter (Signed)
Patient is requesting a fill of amLODipine (NORVASC) 10 MG tablet [[id number]] and metoprolol succinate (TOPROL-XL) 50 MG 24 hr tablet [[id number]]  [facility]. He has his appt to establish with you on 02/17/2016. He has about 2 weeks left for both, so it should cover the layover time for mailing. Please call work #- ok to leave vm

## 2016-01-31 DIAGNOSIS — Z8639 Personal history of other endocrine, nutritional and metabolic disease: Secondary | ICD-10-CM

## 2016-01-31 DIAGNOSIS — E213 Hyperparathyroidism, unspecified: Secondary | ICD-10-CM

## 2016-01-31 HISTORY — DX: Hyperparathyroidism, unspecified: E21.3

## 2016-01-31 HISTORY — DX: Personal history of other endocrine, nutritional and metabolic disease: Z86.39

## 2016-02-12 ENCOUNTER — Other Ambulatory Visit: Payer: BLUE CROSS/BLUE SHIELD

## 2016-02-12 ENCOUNTER — Telehealth: Payer: Self-pay

## 2016-02-12 ENCOUNTER — Other Ambulatory Visit: Payer: Self-pay | Admitting: Internal Medicine

## 2016-02-12 DIAGNOSIS — E7800 Pure hypercholesterolemia, unspecified: Secondary | ICD-10-CM | POA: Insufficient documentation

## 2016-02-12 DIAGNOSIS — R7989 Other specified abnormal findings of blood chemistry: Secondary | ICD-10-CM

## 2016-02-12 DIAGNOSIS — I10 Essential (primary) hypertension: Secondary | ICD-10-CM

## 2016-02-12 DIAGNOSIS — E118 Type 2 diabetes mellitus with unspecified complications: Secondary | ICD-10-CM

## 2016-02-12 DIAGNOSIS — E781 Pure hyperglyceridemia: Secondary | ICD-10-CM | POA: Insufficient documentation

## 2016-02-12 DIAGNOSIS — E785 Hyperlipidemia, unspecified: Secondary | ICD-10-CM

## 2016-02-12 DIAGNOSIS — R945 Abnormal results of liver function studies: Secondary | ICD-10-CM

## 2016-02-12 NOTE — Telephone Encounter (Signed)
Pt is requesting labs before CPE/transfer appt.

## 2016-02-12 NOTE — Telephone Encounter (Signed)
Lab informed.

## 2016-02-12 NOTE — Telephone Encounter (Signed)
Lab orders entered

## 2016-02-13 ENCOUNTER — Other Ambulatory Visit (INDEPENDENT_AMBULATORY_CARE_PROVIDER_SITE_OTHER): Payer: BLUE CROSS/BLUE SHIELD

## 2016-02-13 DIAGNOSIS — R7989 Other specified abnormal findings of blood chemistry: Secondary | ICD-10-CM

## 2016-02-13 DIAGNOSIS — E785 Hyperlipidemia, unspecified: Secondary | ICD-10-CM | POA: Diagnosis not present

## 2016-02-13 DIAGNOSIS — E118 Type 2 diabetes mellitus with unspecified complications: Secondary | ICD-10-CM | POA: Diagnosis not present

## 2016-02-13 DIAGNOSIS — I10 Essential (primary) hypertension: Secondary | ICD-10-CM | POA: Diagnosis not present

## 2016-02-13 DIAGNOSIS — R945 Abnormal results of liver function studies: Secondary | ICD-10-CM

## 2016-02-13 LAB — URINALYSIS, ROUTINE W REFLEX MICROSCOPIC
Bilirubin Urine: NEGATIVE
Hgb urine dipstick: NEGATIVE
Ketones, ur: NEGATIVE
Leukocytes, UA: NEGATIVE
Nitrite: NEGATIVE
Specific Gravity, Urine: 1.015 (ref 1.000–1.030)
Total Protein, Urine: NEGATIVE
Urine Glucose: NEGATIVE
Urobilinogen, UA: 0.2 (ref 0.0–1.0)
pH: 7 (ref 5.0–8.0)

## 2016-02-13 LAB — LIPID PANEL
Cholesterol: 184 mg/dL (ref 0–200)
HDL: 40.4 mg/dL (ref >39.00)
LDL Cholesterol: 123 mg/dL — ABNORMAL HIGH (ref 0–99)
NonHDL: 144.05
Total CHOL/HDL Ratio: 5
Triglycerides: 107 mg/dL (ref 0.0–149.0)
VLDL: 21.4 mg/dL (ref 0.0–40.0)

## 2016-02-13 LAB — COMPREHENSIVE METABOLIC PANEL
ALT: 25 U/L (ref 0–53)
AST: 15 U/L (ref 0–37)
Albumin: 4.3 g/dL (ref 3.5–5.2)
Alkaline Phosphatase: 150 U/L — ABNORMAL HIGH (ref 39–117)
BUN: 21 mg/dL (ref 6–23)
CO2: 31 mEq/L (ref 19–32)
Calcium: 11.3 mg/dL — ABNORMAL HIGH (ref 8.4–10.5)
Chloride: 103 mEq/L (ref 96–112)
Creatinine, Ser: 0.87 mg/dL (ref 0.40–1.50)
GFR: 92.74 mL/min (ref >60.00)
Glucose, Bld: 166 mg/dL — ABNORMAL HIGH (ref 70–99)
Potassium: 3.6 mEq/L (ref 3.5–5.1)
Sodium: 139 mEq/L (ref 135–145)
Total Bilirubin: 1.3 mg/dL — ABNORMAL HIGH (ref 0.2–1.2)
Total Protein: 6.9 g/dL (ref 6.0–8.3)

## 2016-02-13 LAB — MICROALBUMIN / CREATININE URINE RATIO
Creatinine,U: 120.4 mg/dL
Microalb Creat Ratio: 2.1 mg/g (ref 0.0–30.0)
Microalb, Ur: 2.5 mg/dL — ABNORMAL HIGH (ref 0.0–1.9)

## 2016-02-13 LAB — THYROID PANEL WITH TSH
Free Thyroxine Index: 2.1 (ref 1.4–3.8)
T3 Uptake: 27 % (ref 22–35)
T4, Total: 7.7 ug/dL (ref 4.5–12.0)
TSH: 2.21 mIU/L (ref 0.40–4.50)

## 2016-02-13 LAB — HEMOGLOBIN A1C: Hgb A1c MFr Bld: 6.8 % — ABNORMAL HIGH (ref 4.6–6.5)

## 2016-02-13 LAB — HEPATITIS C ANTIBODY: HCV Ab: NEGATIVE

## 2016-02-13 LAB — PHOSPHORUS: Phosphorus: 2.2 mg/dL — ABNORMAL LOW (ref 2.3–4.6)

## 2016-02-13 LAB — MAGNESIUM: Magnesium: 2.2 mg/dL (ref 1.5–2.5)

## 2016-02-14 LAB — PTH, INTACT AND CALCIUM
Calcium: 10.9 mg/dL — ABNORMAL HIGH (ref 8.6–10.3)
PTH: 231 pg/mL — ABNORMAL HIGH (ref 14–64)

## 2016-02-14 LAB — CBC WITH DIFFERENTIAL/PLATELET
Basophils Absolute: 0 10*3/uL (ref 0.0–0.1)
Basophils Relative: 0.2 % (ref 0.0–3.0)
Eosinophils Absolute: 0.2 10*3/uL (ref 0.0–0.7)
Eosinophils Relative: 2 % (ref 0.0–5.0)
HCT: 41.9 % (ref 39.0–52.0)
Hemoglobin: 14.3 g/dL (ref 13.0–17.0)
Lymphocytes Relative: 22.5 % (ref 12.0–46.0)
Lymphs Abs: 2 10*3/uL (ref 0.7–4.0)
MCHC: 34.1 g/dL (ref 30.0–36.0)
MCV: 84.9 fl (ref 78.0–100.0)
Monocytes Absolute: 0.3 10*3/uL (ref 0.1–1.0)
Monocytes Relative: 3.4 % (ref 3.0–12.0)
Neutro Abs: 6.4 10*3/uL (ref 1.4–7.7)
Neutrophils Relative %: 71.9 % (ref 43.0–77.0)
Platelets: 237 10*3/uL (ref 150.0–400.0)
RBC: 4.94 Mil/uL (ref 4.22–5.81)
RDW: 13.9 % (ref 11.5–15.5)
WBC: 9 10*3/uL (ref 4.0–10.5)

## 2016-02-14 LAB — C-PEPTIDE: C-Peptide: 3.27 ng/mL (ref 0.80–3.85)

## 2016-02-17 ENCOUNTER — Encounter: Payer: Self-pay | Admitting: Internal Medicine

## 2016-02-17 ENCOUNTER — Ambulatory Visit (INDEPENDENT_AMBULATORY_CARE_PROVIDER_SITE_OTHER): Payer: BLUE CROSS/BLUE SHIELD | Admitting: Internal Medicine

## 2016-02-17 ENCOUNTER — Other Ambulatory Visit (INDEPENDENT_AMBULATORY_CARE_PROVIDER_SITE_OTHER): Payer: BLUE CROSS/BLUE SHIELD

## 2016-02-17 VITALS — BP 164/90 | HR 80 | Temp 98.3°F | Resp 16 | Ht 71.0 in | Wt 238.4 lb

## 2016-02-17 DIAGNOSIS — Z0000 Encounter for general adult medical examination without abnormal findings: Secondary | ICD-10-CM | POA: Diagnosis not present

## 2016-02-17 DIAGNOSIS — E213 Hyperparathyroidism, unspecified: Secondary | ICD-10-CM | POA: Diagnosis not present

## 2016-02-17 DIAGNOSIS — N400 Enlarged prostate without lower urinary tract symptoms: Secondary | ICD-10-CM

## 2016-02-17 DIAGNOSIS — R748 Abnormal levels of other serum enzymes: Secondary | ICD-10-CM | POA: Insufficient documentation

## 2016-02-17 DIAGNOSIS — I10 Essential (primary) hypertension: Secondary | ICD-10-CM

## 2016-02-17 DIAGNOSIS — H6123 Impacted cerumen, bilateral: Secondary | ICD-10-CM

## 2016-02-17 DIAGNOSIS — Z23 Encounter for immunization: Secondary | ICD-10-CM | POA: Diagnosis not present

## 2016-02-17 DIAGNOSIS — R9431 Abnormal electrocardiogram [ECG] [EKG]: Secondary | ICD-10-CM | POA: Insufficient documentation

## 2016-02-17 DIAGNOSIS — R0600 Dyspnea, unspecified: Secondary | ICD-10-CM

## 2016-02-17 DIAGNOSIS — E118 Type 2 diabetes mellitus with unspecified complications: Secondary | ICD-10-CM

## 2016-02-17 DIAGNOSIS — E785 Hyperlipidemia, unspecified: Secondary | ICD-10-CM

## 2016-02-17 DIAGNOSIS — E559 Vitamin D deficiency, unspecified: Secondary | ICD-10-CM

## 2016-02-17 DIAGNOSIS — R0609 Other forms of dyspnea: Secondary | ICD-10-CM | POA: Diagnosis not present

## 2016-02-17 DIAGNOSIS — D170 Benign lipomatous neoplasm of skin and subcutaneous tissue of head, face and neck: Secondary | ICD-10-CM

## 2016-02-17 LAB — PSA: PSA: 1.02 ng/mL (ref 0.10–4.00)

## 2016-02-17 LAB — VITAMIN D 25 HYDROXY (VIT D DEFICIENCY, FRACTURES): VITD: 15.27 ng/mL — ABNORMAL LOW (ref 30.00–100.00)

## 2016-02-17 MED ORDER — TELMISARTAN 40 MG PO TABS: 40.0000 mg | ORAL_TABLET | Freq: Every day | ORAL | 3 refills | Status: DC

## 2016-02-17 MED ORDER — ASPIRIN EC 81 MG PO TBEC: 81.0000 mg | DELAYED_RELEASE_TABLET | Freq: Every day | ORAL | 3 refills | Status: DC

## 2016-02-17 MED ORDER — ROSUVASTATIN CALCIUM 10 MG PO TABS: 10.0000 mg | ORAL_TABLET | Freq: Every day | ORAL | 3 refills | Status: DC

## 2016-02-17 NOTE — Patient Instructions (Signed)

## 2016-02-17 NOTE — Progress Notes (Signed)
Pre visit review using our clinic review tool, if applicable. No additional management support is needed unless otherwise documented below in the visit note. 

## 2016-02-17 NOTE — Progress Notes (Signed)
Subjective:  Patient ID: Patrick Floyd, male    DOB: 26-Dec-1945  Age: 70 y.o. MRN: [id number]  CC: Annual Exam; Hypertension; Hyperlipidemia; and Diabetes  NEW TO ME  HPI Patrick Floyd presents for an AWV/CPX.  He complains that there are wax accumulations of both ears it's affecting his hearing. He has a history an elevated calcium level and complains of diffuse aches and pains. He has a lipoma on the back of his neck that he says is uncomfortable and he wants to see a surgeon to see if it can or should be removed. He tells me his blood pressure has not been very well controlled and over the last few years he has developed DOE but no CP, palpitations, syncope, fatigue, or diaphoresis. He does complain of edema in both ankles.  Past Medical History:  Diagnosis Date  . Anal fissure   . Basal cell carcinoma   . Diverticulosis of colon   . GERD (gastroesophageal reflux disease)   . Headache(784.0)   . Hemorrhoids, internal   . Hyperlipidemia   . Hyperplastic colonic polyp   . Hypertension   . IBS (irritable bowel syndrome)   . Seasonal allergies   . Ulcerative colitis    Past Surgical History:  Procedure Laterality Date  . COLONOSCOPY    . none      reports that he quit smoking about 36 years ago. He has never used smokeless tobacco. He reports that he does not drink alcohol or use drugs. family history includes Breast cancer in his mother; Heart disease in his father; Hypertension in his other. Allergies  Allergen Reactions  . Amlodipine Swelling    Ankle edema      Outpatient Medications Prior to Visit  Medication Sig Dispense Refill  . mesalamine (LIALDA) 1.2 g EC tablet Take 2 tablets (2.4 g total) by mouth daily with breakfast. 180 tablet 3  . metoprolol succinate (TOPROL-XL) 50 MG 24 hr tablet Take 1 tablet (50 mg total) by mouth daily. Take with or immediately following a meal. 90 tablet 0  . amLODipine (NORVASC) 10 MG tablet Take 1 tablet (10 mg total) by  mouth daily. 90 tablet 0  . diphenhydrAMINE (BENADRYL) 25 mg capsule Take 25 mg by mouth every 6 (six) hours as needed.      Marland Kitchen 0.9 %  sodium chloride infusion      No facility-administered medications prior to visit.     ROS Review of Systems  Constitutional: Negative for activity change, appetite change, chills, diaphoresis, fatigue, fever and unexpected weight change.  HENT: Positive for hearing loss. Negative for ear discharge, ear pain, facial swelling, sinus pressure, sore throat, trouble swallowing and voice change.   Eyes: Negative for visual disturbance.  Respiratory: Positive for shortness of breath. Negative for apnea, cough, choking, chest tightness, wheezing and stridor.   Cardiovascular: Positive for leg swelling. Negative for chest pain and palpitations.  Gastrointestinal: Negative for abdominal pain, blood in stool, constipation, diarrhea, nausea and vomiting.  Endocrine: Negative for cold intolerance, heat intolerance, polydipsia, polyphagia and polyuria.  Genitourinary: Negative.  Negative for difficulty urinating, discharge, dysuria, frequency, hematuria, penile swelling, scrotal swelling, testicular pain and urgency.  Musculoskeletal: Positive for arthralgias and neck pain. Negative for gait problem, joint swelling, myalgias and neck stiffness.  Skin: Negative for color change and rash.  Allergic/Immunologic: Negative.   Neurological: Negative.   Hematological: Negative for adenopathy. Does not bruise/bleed easily.  Psychiatric/Behavioral: Negative.  Negative for dysphoric mood and sleep disturbance.  The patient is not nervous/anxious.     Objective:  BP (!) 164/90 (BP Location: Left Arm, Patient Position: Sitting, Cuff Size: Normal)   Pulse 80   Temp 98.3 F (36.8 C) (Oral)   Resp 16   Ht 5' 11"  (1.803 m)   Wt 238 lb 7 oz (108.2 kg)   SpO2 96%   BMI 33.26 kg/m   BP Readings from Last 3 Encounters:  02/17/16 (!) 164/90  12/24/15 (!) 145/83  10/16/15 (!)  144/80    Wt Readings from Last 3 Encounters:  02/17/16 238 lb 7 oz (108.2 kg)  12/24/15 237 lb (107.5 kg)  10/16/15 237 lb 4 oz (107.6 kg)    Physical Exam  Constitutional: He is oriented to person, place, and time. He appears well-developed and well-nourished. No distress.  HENT:  Head: Normocephalic and atraumatic.  Mouth/Throat: Oropharynx is clear and moist. No oropharyngeal exudate.  I put Colace in both ears. I irrigated the ears and used an ear pick to remove the cerumen. He tolerated this well. Examination afterwards shows that the external auditory canals are normal.  Eyes: Conjunctivae are normal. Right eye exhibits no discharge. Left eye exhibits no discharge. No scleral icterus.  Neck: Normal range of motion. Neck supple. No JVD present. No tracheal deviation present. No thyromegaly present.    Cardiovascular: Normal rate, regular rhythm, normal heart sounds and intact distal pulses.  Exam reveals no gallop and no friction rub.   No murmur heard. EKG ---  Sinus  Rhythm  -Right bundle branch block with left axis -bifascicular block.   ABNORMAL - these changes appear new when compared to EKG from 6 yrs ago  Pulmonary/Chest: Effort normal and breath sounds normal. No stridor. No respiratory distress. He has no wheezes. He has no rales. He exhibits no tenderness.  Abdominal: Soft. Bowel sounds are normal. He exhibits no distension. There is no tenderness. There is no rebound and no guarding. Hernia confirmed negative in the right inguinal area and confirmed negative in the left inguinal area.  Genitourinary: Rectum normal, testes normal and penis normal. Rectal exam shows no external hemorrhoid, no internal hemorrhoid, no fissure, no mass, no tenderness, anal tone normal and guaiac negative stool. Prostate is enlarged (1+ smooth symm BPH). Prostate is not tender. Right testis shows no mass, no swelling and no tenderness. Right testis is descended. Left testis shows no mass, no  swelling and no tenderness. Left testis is descended. Circumcised. No penile erythema or penile tenderness. No discharge found.  Musculoskeletal: Normal range of motion. He exhibits edema (trace pitting edema in BLE). He exhibits no tenderness or deformity.  Lymphadenopathy:    He has no cervical adenopathy.       Right: No inguinal adenopathy present.       Left: No inguinal adenopathy present.  Neurological: He is oriented to person, place, and time.  Skin: Skin is warm and dry. No rash noted. He is not diaphoretic. No erythema.  Vitals reviewed.   Lab Results  Component Value Date   WBC 9.0 02/13/2016   HGB 14.3 02/13/2016   HCT 41.9 02/13/2016   PLT 237.0 02/13/2016   GLUCOSE 166 (H) 02/13/2016   CHOL 184 02/13/2016   TRIG 107.0 02/13/2016   HDL 40.40 02/13/2016   LDLDIRECT 166.7 08/24/2012   LDLCALC 123 (H) 02/13/2016   ALT 25 02/13/2016   AST 15 02/13/2016   NA 139 02/13/2016   K 3.6 02/13/2016   CL 103 02/13/2016   CREATININE  0.87 02/13/2016   BUN 21 02/13/2016   CO2 31 02/13/2016   TSH 2.21 02/13/2016   PSA 1.02 02/17/2016   HGBA1C 6.8 (H) 02/13/2016   MICROALBUR 2.5 (H) 02/13/2016    Ct Abdomen W Contrast  Result Date: 12/23/2012 CLINICAL DATA:  Skin thickening/ mass, evaluate for tumor EXAM: CT ABDOMEN WITH CONTRAST TECHNIQUE: Multidetector CT imaging of the abdomen was performed using the standard protocol following bolus administration of intravenous contrast. CONTRAST:  141m OMNIPAQUE IOHEXOL 300 MG/ML  SOLN COMPARISON:  None. FINDINGS: Lower Chest: The lung bases are clear. Visualized cardiac structures are within normal limits for size. No pericardial effusion. Unremarkable visualized distal thoracic esophagus. Tiny 2 mm triangular subpleural nodule is almost certainly a benign subpleural lymph node. Abdomen: Unremarkable CT appearance of the stomach, duodenum, spleen, adrenal glands and the pancreas. Normal hepatic contours and morphology. No focal hepatic  lesion. Gallbladder is unremarkable. No intra or extrahepatic biliary ductal dilatation. Unremarkable appearance of the bilateral kidneys. No focal solid lesion, hydronephrosis or nephrolithiasis. Left parapelvic renal sinus cysts. No evidence of obstruction or focal bowel wall thickening. Normal appendix in the right lower quadrant. The terminal ileum is unremarkable. Colonic diverticular disease without CT evidence of active inflammation. Bones/Soft Tissues: Small focus of nonspecific stranding in the superficial subcutaneous fat of the right lower quadrant anterior abdominal wall. This small abnormality as marked by a BB on the skin and corresponds to the patient's palpable abnormality. There is a tiny fat containing umbilical hernia. No acute fracture or aggressive appearing lytic or blastic osseous lesion. L5-S1 degenerative disc disease. Vascular: Atherosclerotic vascular disease without significant stenosis or aneurysmal dilatation. IMPRESSION: 1. Small focus of nonspecific stranding in the superficial subcutaneous fat of the right lower quadrant abdominal wall in the region of the patient's palpable abnormality. While nonspecific, this finding most likely represents a small focus of fat necrosis. Query history of prior trauma or impact at this location. 2. Negative for intra-abdominal malignancy or evidence of metastatic disease. 3. Colonic diverticular disease without CT evidence of active inflammation. 4. Atherosclerotic vascular disease without aneurysmal dilatation or significant appearing stenosis. 5. L5-S1 degenerative disc disease. Electronically Signed   By: HJacqulynn CadetM.D.   On: 12/23/2012 09:06    Assessment & Plan:   LSaahirwas seen today for annual exam, hypertension, hyperlipidemia and diabetes.  Diagnoses and all orders for this visit:  Type 2 diabetes mellitus with complication, without long-term current use of insulin ([place]- his A1c is 6.8% so no medications are needed to  treat this. He agrees to work on his lifestyle modifications. I will start an ARB for renal protection and of asked him to have an annual examination. -     Ambulatory referral to Ophthalmology -     telmisartan (MICARDIS) 40 MG tablet; Take 1 tablet (40 mg total) by mouth daily. -     rosuvastatin (CRESTOR) 10 MG tablet; Take 1 tablet (10 mg total) by mouth daily. -     aspirin EC 81 MG tablet; Take 1 tablet (81 mg total) by mouth daily.  Essential hypertension- his blood pressure is not adequately well controlled and he appears to have peripheral edema from the calcium channel blocker. I've asked him to stop taking amlodipine and we'll start an ARB.  Hyperparathyroidism ([place]- he has a very high PTH level in addition to mild hypercalcemia and low phosphorus level. He also has a low vitamin D levels so this may be secondary hyperparathyroidism due to vitamin D deficiency.  I will start repleting his vitamin D level and of asked him to follow-up with endocrinology to see if any additional diagnostic or treatment options need to be offered. -     VITAMIN D 25 Hydroxy (Vit-D Deficiency, Fractures); Future -     Ambulatory referral to Endocrinology -     EKG 12-Lead  Routine health maintenance  Hyperlipidemia with target LDL less than 100- I've asked him to start a statin and aspirin for cardiovascular risk reduction. -     telmisartan (MICARDIS) 40 MG tablet; Take 1 tablet (40 mg total) by mouth daily. -     rosuvastatin (CRESTOR) 10 MG tablet; Take 1 tablet (10 mg total) by mouth daily. -     aspirin EC 81 MG tablet; Take 1 tablet (81 mg total) by mouth daily.  Hypercalcemia -     VITAMIN D 25 Hydroxy (Vit-D Deficiency, Fractures); Future -     Ambulatory referral to Endocrinology  Alkaline phosphatase elevation- I will check the isoenzymes of this to see if there is concern for Paget's disease -     Alkaline phosphatase, isoenzymes; Future  Benign prostatic hyperplasia without lower urinary  tract symptoms- his PSA is low so I'm not concerned about prostate cancer, in addition he has no symptoms that need to be treated. -     PSA; Future  Lipoma of skin and subcutaneous tissue of neck -     Ambulatory referral to General Surgery  Nonspecific abnormal electrocardiogram (ECG) (EKG)- he has multiple risk factors, symptoms, and an abnormal EKG. I've asked him to undergo cardiac imaging to screen for ischemic cardiovascular disease. -     Myocardial Perfusion Imaging; Future  DOE (dyspnea on exertion)- order a Lexiscan to screen for ischemic disease and to get a gauge of his ejection fraction. -     EKG 12-Lead -     Myocardial Perfusion Imaging; Future  Need for prophylactic vaccination against Streptococcus pneumoniae (pneumococcus) -     Pneumococcal conjugate vaccine 13-valent  Need for prophylactic vaccination with combined diphtheria-tetanus-pertussis (DTP) vaccine -     Tdap vaccine greater than or equal to 7yo IM  Vitamin D deficiency -     Cholecalciferol 50000 units capsule; Take 1 capsule (50,000 Units total) by mouth once a week.   I have discontinued Mr. Mendibles diphenhydrAMINE and amLODipine. I am also having him start on telmisartan, rosuvastatin, aspirin EC, and Cholecalciferol. Additionally, I am having him maintain his mesalamine and metoprolol succinate. We will stop administering sodium chloride.  Meds ordered this encounter  Medications  . telmisartan (MICARDIS) 40 MG tablet    Sig: Take 1 tablet (40 mg total) by mouth daily.    Dispense:  90 tablet    Refill:  3  . rosuvastatin (CRESTOR) 10 MG tablet    Sig: Take 1 tablet (10 mg total) by mouth daily.    Dispense:  90 tablet    Refill:  3  . aspirin EC 81 MG tablet    Sig: Take 1 tablet (81 mg total) by mouth daily.    Dispense:  90 tablet    Refill:  3  . Cholecalciferol 50000 units capsule    Sig: Take 1 capsule (50,000 Units total) by mouth once a week.    Dispense:  12 capsule    Refill:   3   See AVS for instructions about healthy living and anticipatory guidance.  Follow-up: Return in about 3 months (around 05/17/2016).  Scarlette Calico, MD

## 2016-02-18 ENCOUNTER — Encounter: Payer: Self-pay | Admitting: Internal Medicine

## 2016-02-18 ENCOUNTER — Encounter: Payer: Self-pay | Admitting: Gastroenterology

## 2016-02-18 DIAGNOSIS — H6123 Impacted cerumen, bilateral: Secondary | ICD-10-CM | POA: Insufficient documentation

## 2016-02-18 DIAGNOSIS — E559 Vitamin D deficiency, unspecified: Secondary | ICD-10-CM | POA: Insufficient documentation

## 2016-02-18 MED ORDER — CHOLECALCIFEROL 1.25 MG (50000 UT) PO CAPS: 50000.0000 [IU] | ORAL_CAPSULE | ORAL | 3 refills | Status: DC

## 2016-02-21 ENCOUNTER — Other Ambulatory Visit: Payer: Self-pay

## 2016-02-21 MED ORDER — BALSALAZIDE DISODIUM 750 MG PO CAPS: 1500.0000 mg | ORAL_CAPSULE | Freq: Three times a day (TID) | ORAL | 11 refills | Status: DC

## 2016-02-26 ENCOUNTER — Encounter: Payer: Self-pay | Admitting: Internal Medicine

## 2016-02-26 LAB — ALKALINE PHOSPHATASE, ISOENZYMES
Alkaline Phosphatase: 179 IU/L — ABNORMAL HIGH (ref 39–117)
BONE FRACTION: 74 % — ABNORMAL HIGH (ref 12–68)
INTESTINAL FRAC.: 0 % (ref 0–18)
LIVER FRACTION: 26 % (ref 13–88)

## 2016-02-27 ENCOUNTER — Telehealth: Payer: Self-pay | Admitting: Internal Medicine

## 2016-02-27 NOTE — Telephone Encounter (Signed)
Pt states he would like to see a cardiologist due to the abnormal testing and is requesting a referral to Dr. Marlou Porch.  Please advise

## 2016-03-02 ENCOUNTER — Other Ambulatory Visit: Payer: Self-pay | Admitting: Internal Medicine

## 2016-03-02 DIAGNOSIS — R0609 Other forms of dyspnea: Secondary | ICD-10-CM

## 2016-03-02 DIAGNOSIS — R0600 Dyspnea, unspecified: Secondary | ICD-10-CM

## 2016-03-02 DIAGNOSIS — R9431 Abnormal electrocardiogram [ECG] [EKG]: Secondary | ICD-10-CM

## 2016-03-03 ENCOUNTER — Inpatient Hospital Stay (HOSPITAL_COMMUNITY): Admission: RE | Admit: 2016-03-03 | Payer: BLUE CROSS/BLUE SHIELD | Source: Ambulatory Visit

## 2016-03-09 DIAGNOSIS — D170 Benign lipomatous neoplasm of skin and subcutaneous tissue of head, face and neck: Secondary | ICD-10-CM | POA: Diagnosis not present

## 2016-03-12 ENCOUNTER — Encounter: Payer: Self-pay | Admitting: Cardiology

## 2016-03-12 ENCOUNTER — Ambulatory Visit (INDEPENDENT_AMBULATORY_CARE_PROVIDER_SITE_OTHER): Payer: BLUE CROSS/BLUE SHIELD | Admitting: Cardiology

## 2016-03-12 VITALS — BP 154/96 | HR 82 | Ht 73.0 in | Wt 237.0 lb

## 2016-03-12 DIAGNOSIS — E785 Hyperlipidemia, unspecified: Secondary | ICD-10-CM

## 2016-03-12 DIAGNOSIS — I4510 Unspecified right bundle-branch block: Secondary | ICD-10-CM

## 2016-03-12 DIAGNOSIS — R0609 Other forms of dyspnea: Secondary | ICD-10-CM | POA: Diagnosis not present

## 2016-03-12 DIAGNOSIS — I10 Essential (primary) hypertension: Secondary | ICD-10-CM | POA: Diagnosis not present

## 2016-03-12 DIAGNOSIS — R0600 Dyspnea, unspecified: Secondary | ICD-10-CM

## 2016-03-12 MED ORDER — NEBIVOLOL HCL 10 MG PO TABS: 10.0000 mg | ORAL_TABLET | Freq: Every day | ORAL | 3 refills | Status: DC

## 2016-03-12 NOTE — Progress Notes (Signed)
Cardiology Office Note    Date:  03/12/2016   ID:  Patrick Floyd, DOB 1945/12/01, MRN [id number]  PCP:  Scarlette Calico, MD  Cardiologist:   Candee Furbish, MD     History of Present Illness:  Patrick Floyd is a 71 y.o. male here for evaluation of fatigue, dyspnea on exertion at the request of Dr. Scarlette Calico.  Rare palpitations, occasional. Stress at work.   Has a history of diabetes with hemoglobin A1c 6.8, essential hypertension with prior peripheral edema from amlodipine, hyperparathyroidism and hyperlipidemia.  He was wanting to discuss ordered myocardial perfusion study prior to having test. He's not having any significant chest pain, no syncope, no bleeding, no orthopnea.  His blood pressure has been elevated recently. Perhaps hyperparathyroidism is playing a role in this. He is going to see endocrinology soon.  He was offered Crestor but he has not started this yet. Wondered if maybe he is having side effects from the different blood pressure medications. He has stopped the new blood pressure medications and went back to his amlodipine and Toprol that he had been on for years.  He does have peripheral edema likely associated with amlodipine, agreed.     Past Medical History:  Diagnosis Date  . Anal fissure   . Basal cell carcinoma   . Diverticulosis of colon   . GERD (gastroesophageal reflux disease)   . Headache(784.0)   . Hemorrhoids, internal   . Hyperlipidemia   . Hyperplastic colonic polyp   . Hypertension   . IBS (irritable bowel syndrome)   . Seasonal allergies   . Ulcerative colitis     Past Surgical History:  Procedure Laterality Date  . COLONOSCOPY    . none      Current Medications: Outpatient Medications Prior to Visit  Medication Sig Dispense Refill  . aspirin EC 81 MG tablet Take 1 tablet (81 mg total) by mouth daily. 90 tablet 3  . balsalazide (COLAZAL) 750 MG capsule Take 2 capsules (1,500 mg total) by mouth 3 (three) times daily. 180  capsule 11  . metoprolol succinate (TOPROL-XL) 50 MG 24 hr tablet Take 1 tablet (50 mg total) by mouth daily. Take with or immediately following a meal. 90 tablet 0  . Cholecalciferol 50000 units capsule Take 1 capsule (50,000 Units total) by mouth once a week. 12 capsule 3  . mesalamine (LIALDA) 1.2 g EC tablet Take 2 tablets (2.4 g total) by mouth daily with breakfast. 180 tablet 3  . rosuvastatin (CRESTOR) 10 MG tablet Take 1 tablet (10 mg total) by mouth daily. 90 tablet 3  . telmisartan (MICARDIS) 40 MG tablet Take 1 tablet (40 mg total) by mouth daily. 90 tablet 3   No facility-administered medications prior to visit.      Allergies:   Amlodipine   Social History   Social History  . Marital status: Married    Spouse name: N/A  . Number of children: N/A  . Years of education: N/A   Social History Main Topics  . Smoking status: Former Smoker    Quit date: 12/05/1979  . Smokeless tobacco: Never Used  . Alcohol use No     Comment: rare  . Drug use: No  . Sexual activity: Not Asked   Other Topics Concern  . 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 Marriage is in good health. Work is usual amount of stress.      Family History:  The patient's family history includes Breast cancer in his mother; Heart disease in his father; Hypertension in his other.   ROS:   Please see the history of present illness.    ROS All other systems reviewed and are negative.   PHYSICAL EXAM:   VS:  BP (!) 154/96   Pulse 82   Ht 6' 1"  (1.854 m)   Wt 237 lb (107.5 kg)   BMI 31.27 kg/m    GEN: Well nourished, well developed, in no acute distress  HEENT: normal  Neck: no JVD, carotid bruits, or masses Cardiac: RRR; no murmurs, rubs, or gallops,Mild peripheral edema  Respiratory:  clear to auscultation  bilaterally, normal work of breathing GI: soft, nontender, nondistended, + BS MS: no deformity or atrophy  Skin: warm and dry, no rash, Mild peripheral edema Neuro:  Alert and Oriented x 3, Strength and sensation are intact Psych: euthymic mood, full affect  Wt Readings from Last 3 Encounters:  03/12/16 237 lb (107.5 kg)  02/17/16 238 lb 7 oz (108.2 kg)  12/24/15 237 lb (107.5 kg)      Studies/Labs Reviewed:   EKG:  EKG is ordered today.  The ekg ordered today demonstrates Sinus rhythm, right bundle branch block, left anterior fascicular block, bifascicular blockPersonally viewed  Recent Labs: 02/13/2016: ALT 25; BUN 21; Creatinine, Ser 0.87; Hemoglobin 14.3; Magnesium 2.2; Platelets 237.0; Potassium 3.6; Sodium 139; TSH 2.21   Lipid Panel    Component Value Date/Time   CHOL 184 02/13/2016 0734   TRIG 107.0 02/13/2016 0734   HDL 40.40 02/13/2016 0734   CHOLHDL 5 02/13/2016 0734   VLDL 21.4 02/13/2016 0734   LDLCALC 123 (H) 02/13/2016 0734   LDLDIRECT 166.7 08/24/2012 1218    Additional studies/ records that were reviewed today include:  EKG, lab work, office notes reviewed    ASSESSMENT:    1. DOE (dyspnea on exertion)   2. Hyperlipidemia with target LDL less than 100   3. Essential hypertension   4. RBBB      PLAN:  In order of problems listed above:  Dyspnea on exertion  - Could be anginal equivalent with his risk factors of diabetes, diet controlled hyperlipidemia essential hypertension  - It would not be unreasonable to proceed with pharmacologic stress testing given his diabetes, coronary artery disease equivalent.  Right bundle-branch block/left anterior fascicular block  - EKG abnormality. This was not noted on prior EKGs. Thankfully, right bundle branch block is not been associated with increased cardiovascular mortality. Nonetheless, it would be nice to get an echocardiogram to ensure proper structure and function of his heart.  - I recommend repeating  EKG in one year.  Diabetes mellitus  - Dr. Scarlette Calico has been watching closely.  - Dietary management  Hyperlipidemia  - Agree with statin therapy. He has not started his Crestor as of yet. He was worried about potential side effects with other medications. It does make sense to start these medicines stepwise. If he is tolerating the Bystolic well, he may then initiate Crestor.  Hyperparathyroidism  - Course, this can be exacerbating hypertension. He will be seeing endocrinology soon.  We will see what the results are of his stress test.  Medication Adjustments/Labs and Tests Ordered: Current medicines are reviewed at length with the patient today.  Concerns regarding medicines are outlined above.  Medication changes, Labs  and Tests ordered today are listed in the Patient Instructions below. Patient Instructions  Medication Instructions:  Please stop your Metoprolol. Start Bystolic 10 mg a day. Continue all other medications as listed.  Your physician has requested that you have an echocardiogram. Echocardiography is a painless test that uses sound waves to create images of your heart. It provides your doctor with information about the size and shape of your heart and how well your heart's chambers and valves are working. This procedure takes approximately one hour. There are no restrictions for this procedure.  Follow-Up: Follow up as needed with Dr Marlou Porch.  Thank you for choosing [facility]!!        Signed, Candee Furbish, MD  03/12/2016 4:09 PM    [facility] [place] [place], [place], [place]  [address] Phone: [phone]; Fax: [phone]

## 2016-03-12 NOTE — Patient Instructions (Addendum)
Medication Instructions:  Please stop your Metoprolol. Start Bystolic 10 mg a day. Continue all other medications as listed.  Your physician has requested that you have an echocardiogram. Echocardiography is a painless test that uses sound waves to create images of your heart. It provides your doctor with information about the size and shape of your heart and how well your heart's chambers and valves are working. This procedure takes approximately one hour. There are no restrictions for this procedure.  Follow-Up: Follow up as needed with Dr Marlou Porch.  Thank you for choosing [facility]!!

## 2016-03-17 ENCOUNTER — Telehealth (HOSPITAL_COMMUNITY): Payer: Self-pay | Admitting: *Deleted

## 2016-03-17 NOTE — Telephone Encounter (Signed)
Left message on voicemail per DPR in reference to upcoming appointment scheduled on 03/20/16 at [facility] with detailed instructions given per Myocardial Perfusion Study Information Sheet for the test. LM to arrive 15 minutes early, and that it is imperative to arrive on time for appointment to keep from having the test rescheduled. If you need to cancel or reschedule your appointment, please call the office within 24 hours of your appointment. Failure to do so may result in a cancellation of your appointment, and a $50 no show fee. Phone number given for call back for any questions.

## 2016-03-20 ENCOUNTER — Ambulatory Visit (HOSPITAL_COMMUNITY): Payer: BLUE CROSS/BLUE SHIELD | Attending: Cardiovascular Disease

## 2016-03-20 DIAGNOSIS — R0609 Other forms of dyspnea: Secondary | ICD-10-CM

## 2016-03-20 DIAGNOSIS — R9431 Abnormal electrocardiogram [ECG] [EKG]: Secondary | ICD-10-CM | POA: Diagnosis not present

## 2016-03-20 DIAGNOSIS — R9439 Abnormal result of other cardiovascular function study: Secondary | ICD-10-CM | POA: Diagnosis not present

## 2016-03-20 DIAGNOSIS — I510 Cardiac septal defect, acquired: Secondary | ICD-10-CM | POA: Insufficient documentation

## 2016-03-20 DIAGNOSIS — R0600 Dyspnea, unspecified: Secondary | ICD-10-CM

## 2016-03-20 LAB — MYOCARDIAL PERFUSION IMAGING
LV dias vol: 127 mL (ref 62–150)
LV sys vol: 52 mL
Peak HR: 81 {beats}/min
RATE: 0.28
Rest HR: 58 {beats}/min
SDS: 0
SRS: 3
SSS: 3
TID: 0.91

## 2016-03-20 MED ORDER — REGADENOSON 0.4 MG/5ML IV SOLN
0.4000 mg | Freq: Once | INTRAVENOUS | Status: AC
  Administered 2016-03-20: 0.4 mg via INTRAVENOUS

## 2016-03-20 MED ORDER — TECHNETIUM TC 99M TETROFOSMIN IV KIT
9.2000 | PACK | Freq: Once | INTRAVENOUS | Status: AC | PRN
  Administered 2016-03-20: 9.2 via INTRAVENOUS
  Filled 2016-03-20: qty 10

## 2016-03-20 MED ORDER — TECHNETIUM TC 99M TETROFOSMIN IV KIT
28.1000 | PACK | Freq: Once | INTRAVENOUS | Status: AC | PRN
  Administered 2016-03-20: 28.1 via INTRAVENOUS
  Filled 2016-03-20: qty 29

## 2016-03-21 NOTE — Progress Notes (Signed)
Subjective:    Patient ID: Patrick Floyd, male    DOB: January 10, 1946, 71 y.o.   MRN: [id number]  HPI Pt is referred by Dr Ronnald Ramp, for hyperparathyroidism.  Pt was noted to have moderate hypercalcemia in 2010 (it was normal in 2008). He has never had osteoporosis, urolithiasis, thyroid probs, sarcoidosis, cancer, PUD, pancreatitis, depression, or bony fracture.  He does not take vitamin-D or A supplements.  Pt has no h/o prolonged immobilization.  Pt denies taking antacids, Li++, or HCTZ.  He has minimal weakness of the legs,and assoc fatigue.   Past Medical History:  Diagnosis Date  . Anal fissure   . Basal cell carcinoma   . Diverticulosis of colon   . GERD (gastroesophageal reflux disease)   . Headache(784.0)   . Hemorrhoids, internal   . Hyperlipidemia   . Hyperplastic colonic polyp   . Hypertension   . IBS (irritable bowel syndrome)   . Seasonal allergies   . Ulcerative colitis     Past Surgical History:  Procedure Laterality Date  . COLONOSCOPY    . none      Social History   Social History  . Marital status: Married    Spouse name: N/A  . Number of children: N/A  . Years of education: N/A   Occupational History  . Not on file.   Social History Main Topics  . Smoking status: Former Smoker    Quit date: 12/05/1979  . Smokeless tobacco: Never Used  . Alcohol use No     Comment: rare  . Drug use: No  . Sexual activity: Not on file   Other Topics Concern  . Not on file   Social History Narrative   Forensic psychologist, 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 Marriage is in good health. Work is usual amount of stress.     Current Outpatient Prescriptions on File Prior to Visit  Medication Sig Dispense Refill  . aspirin EC 81 MG tablet Take 1 tablet (81 mg total) by mouth daily. 90 tablet 3  . balsalazide (COLAZAL) 750 MG  capsule Take 2 capsules (1,500 mg total) by mouth 3 (three) times daily. 180 capsule 11  . nebivolol (BYSTOLIC) 10 MG tablet Take 1 tablet (10 mg total) by mouth daily. 90 tablet 3  . cholecalciferol (VITAMIN D) 1000 units tablet Take 1,000 Units by mouth daily.     No current facility-administered medications on file prior to visit.     Allergies  Allergen Reactions  . Amlodipine Swelling    Ankle edema    Family History  Problem Relation Age of Onset  . Hypertension Other   . Breast cancer Mother   . Heart disease Father   . Colon cancer Neg Hx   . Stomach cancer Neg Hx   . Cancer Neg Hx     Colon and Prostate  . Hyperparathyroidism Neg Hx     BP 130/78   Pulse 70   Ht 6' 1"  (1.854 m)   Wt 241 lb (109.3 kg)   SpO2 94%   BMI 31.80 kg/m    Review of Systems denies weight loss, gynecomastia, hematuria, memory loss, erectile dysfunction, numbness, arthralgias, abdominal pain, urinary frequency, hypoglycemia, skin rash, visual loss, sob, diarrhea, rhinorrhea, easy bruising, and depression.      Objective:   Physical Exam VS: see vs page GEN: no distress  HEAD: head: no deformity eyes: no periorbital swelling, no proptosis external nose and ears are normal mouth: no lesion seen NECK: supple, thyroid is not enlarged CHEST WALL: no deformity.  No kyphosis. LUNGS: clear to auscultation CV: reg rate and rhythm, no murmur ABD: abdomen is soft, nontender.  no hepatosplenomegaly.  not distended.  no hernia MUSCULOSKELETAL: muscle bulk and strength are grossly normal.  no obvious joint swelling.  gait is normal and steady EXTEMITIES: 1+ bilat edema.  PULSES: dorsalis pedis intact bilat.  no carotid bruit NEURO:  cn 2-12 grossly intact.   readily moves all 4's.  sensation is intact to touch on the feet SKIN:  Normal texture and temperature.  No rash or suspicious lesion is visible.   NODES:  None palpable at the neck PSYCH: alert, well-oriented.  Does not appear anxious nor  depressed.  Lab Results  Component Value Date   PTH 231 (H) 02/13/2016   CALCIUM 11.3 (H) 02/13/2016   CALCIUM 10.9 (H) 02/13/2016   PHOS 2.2 (L) 02/13/2016   Lab Results  Component Value Date   ALT 25 02/13/2016   AST 15 02/13/2016   ALKPHOS 179 (H) 02/17/2016   BILITOT 1.3 (H) 02/13/2016   Lab Results  Component Value Date   CREATININE 0.87 02/13/2016   BUN 21 02/13/2016   NA 139 02/13/2016   K 3.6 02/13/2016   CL 103 02/13/2016   CO2 31 02/13/2016   25-OH VIT-D=18.      Assessment & Plan:  Hyperparathyroidism, new to me: he prob has a combination of primary and secondary.  the degree of hypercalcemia, especially with vit-D supplementation she needs, will be an indication for surgery. Vit-D deficiency, persistent. Alk phos elevation, prob due to hyperparathyroidism  Patient is advised the following: Patient Instructions  I have sent a prescription to your pharmacy, for the vitamin-D.  Please recheck the blood test in 2-3 weeks.    You will probably need surgery for this, so I'll cc this note to Dr Brantley Stage.

## 2016-03-23 ENCOUNTER — Encounter: Payer: Self-pay | Admitting: Endocrinology

## 2016-03-23 ENCOUNTER — Telehealth: Payer: Self-pay

## 2016-03-23 ENCOUNTER — Ambulatory Visit (INDEPENDENT_AMBULATORY_CARE_PROVIDER_SITE_OTHER): Payer: BLUE CROSS/BLUE SHIELD | Admitting: Endocrinology

## 2016-03-23 ENCOUNTER — Encounter: Payer: Self-pay | Admitting: Internal Medicine

## 2016-03-23 ENCOUNTER — Encounter: Payer: Self-pay | Admitting: Cardiology

## 2016-03-23 VITALS — BP 130/78 | HR 70 | Ht 73.0 in | Wt 241.0 lb

## 2016-03-23 DIAGNOSIS — E213 Hyperparathyroidism, unspecified: Secondary | ICD-10-CM

## 2016-03-23 DIAGNOSIS — E210 Primary hyperparathyroidism: Secondary | ICD-10-CM

## 2016-03-23 MED ORDER — VITAMIN D (ERGOCALCIFEROL) 1.25 MG (50000 UNIT) PO CAPS: 50000.0000 [IU] | ORAL_CAPSULE | ORAL | 0 refills | Status: DC

## 2016-03-23 NOTE — Patient Instructions (Addendum)
I have sent a prescription to your pharmacy, for the vitamin-D.  Please recheck the blood test in 2-3 weeks.    You will probably need surgery for this, so I'll cc this note to Dr Brantley Stage.

## 2016-03-23 NOTE — Telephone Encounter (Signed)
_Patient stated he need a paper with dx code of his visit for insurance purposes. Mail to him

## 2016-03-24 ENCOUNTER — Encounter: Payer: Self-pay | Admitting: Endocrinology

## 2016-03-24 DIAGNOSIS — E210 Primary hyperparathyroidism: Secondary | ICD-10-CM

## 2016-03-24 NOTE — Telephone Encounter (Signed)
See message, Is this something we could put on our letter head?

## 2016-03-24 NOTE — Telephone Encounter (Signed)
e21.0

## 2016-03-25 NOTE — Telephone Encounter (Signed)
Letter printed and mailed to the patient per his request.

## 2016-03-27 ENCOUNTER — Other Ambulatory Visit: Payer: Self-pay

## 2016-03-27 ENCOUNTER — Ambulatory Visit (HOSPITAL_COMMUNITY): Payer: BLUE CROSS/BLUE SHIELD | Attending: Cardiovascular Disease

## 2016-03-27 DIAGNOSIS — I5030 Unspecified diastolic (congestive) heart failure: Secondary | ICD-10-CM | POA: Insufficient documentation

## 2016-03-27 DIAGNOSIS — I351 Nonrheumatic aortic (valve) insufficiency: Secondary | ICD-10-CM | POA: Diagnosis not present

## 2016-03-27 DIAGNOSIS — R0609 Other forms of dyspnea: Secondary | ICD-10-CM

## 2016-03-27 DIAGNOSIS — I517 Cardiomegaly: Secondary | ICD-10-CM | POA: Diagnosis not present

## 2016-03-27 DIAGNOSIS — R9439 Abnormal result of other cardiovascular function study: Secondary | ICD-10-CM | POA: Insufficient documentation

## 2016-03-27 DIAGNOSIS — R0600 Dyspnea, unspecified: Secondary | ICD-10-CM

## 2016-03-31 ENCOUNTER — Encounter: Payer: Self-pay | Admitting: Cardiology

## 2016-04-06 ENCOUNTER — Other Ambulatory Visit (INDEPENDENT_AMBULATORY_CARE_PROVIDER_SITE_OTHER): Payer: BLUE CROSS/BLUE SHIELD

## 2016-04-06 ENCOUNTER — Ambulatory Visit (INDEPENDENT_AMBULATORY_CARE_PROVIDER_SITE_OTHER)
Admission: RE | Admit: 2016-04-06 | Discharge: 2016-04-06 | Disposition: A | Discharge status: Home / Self Care | Payer: BLUE CROSS/BLUE SHIELD | Source: Ambulatory Visit | Attending: Endocrinology | Admitting: Endocrinology

## 2016-04-06 DIAGNOSIS — E213 Hyperparathyroidism, unspecified: Secondary | ICD-10-CM

## 2016-04-06 LAB — HEPATIC FUNCTION PANEL
ALT: 13 U/L (ref 0–53)
AST: 15 U/L (ref 0–37)
Albumin: 4.2 g/dL (ref 3.5–5.2)
Alkaline Phosphatase: 126 U/L — ABNORMAL HIGH (ref 39–117)
Bilirubin, Direct: 0.2 mg/dL (ref 0.0–0.3)
Total Bilirubin: 0.9 mg/dL (ref 0.2–1.2)
Total Protein: 6.8 g/dL (ref 6.0–8.3)

## 2016-04-06 LAB — VITAMIN D 25 HYDROXY (VIT D DEFICIENCY, FRACTURES): VITD: 30.7 ng/mL (ref 30.00–100.00)

## 2016-04-07 LAB — PTH, INTACT AND CALCIUM
Calcium: 11.5 mg/dL — ABNORMAL HIGH (ref 8.6–10.3)
PTH: 198 pg/mL — ABNORMAL HIGH (ref 14–64)

## 2016-04-10 ENCOUNTER — Encounter: Payer: Self-pay | Admitting: Endocrinology

## 2016-04-10 ENCOUNTER — Encounter: Payer: Self-pay | Admitting: Cardiology

## 2016-04-12 DIAGNOSIS — E213 Hyperparathyroidism, unspecified: Secondary | ICD-10-CM | POA: Diagnosis not present

## 2016-05-06 DIAGNOSIS — E119 Type 2 diabetes mellitus without complications: Secondary | ICD-10-CM | POA: Diagnosis not present

## 2016-05-06 LAB — HM DIABETES EYE EXAM

## 2016-05-28 ENCOUNTER — Encounter: Payer: Self-pay | Admitting: Internal Medicine

## 2016-05-28 NOTE — Progress Notes (Signed)
Result Abstracted

## 2016-06-04 ENCOUNTER — Telehealth: Payer: Self-pay | Admitting: Gastroenterology

## 2016-06-05 ENCOUNTER — Telehealth: Payer: Self-pay | Admitting: Cardiology

## 2016-06-05 NOTE — Telephone Encounter (Signed)
Left message for patient to call back  

## 2016-06-05 NOTE — Telephone Encounter (Signed)
Agree with going back to Asacol 1.6 g tid would be dose  If worsens we could treat with prednisone or budesonide and he can call back if that happens before 4/11.

## 2016-06-05 NOTE — Telephone Encounter (Signed)
New Message    Please call pt would like to speak to you about his bp running high on the medication [phone]

## 2016-06-05 NOTE — Telephone Encounter (Signed)
Patient reports that he has had a 3 week history of rectal bleeding. Blood is bright red and in the stool. He is maintained on Colazal 2 TID.  No diarrhea or urgency.  He notes there has been a change in his stools "small pieces". He is having 2 formed BMs a day.  Stools are not hard. His insurance stopped paying for his Asacol and that is why he was changed to colazal.  He feels that the colazal is the problem. He has a large bottle of Asacol at home and is asking if he should switch back to the Asacol until he is out then return to Colazal?  I put him on to see Nicoletta Ba PA on 06/10/16 (next Wed).   Dr. Carlean Purl you are MD of the day please advise until office visit next week.

## 2016-06-05 NOTE — Telephone Encounter (Signed)
Patient notified of the results and recommendations

## 2016-06-05 NOTE — Telephone Encounter (Signed)
Spoke with patient who states he purchased a BP cuff recently and has been getting BP readings of 160/90 mmHg. He is checking BP in the evenings when he has had time to relax after work. He takes bystolic 10 mg daily in the mornings and states he wonders if if is working well enough for him. I asked about diet and exercise and he admits to eating out everyday for lunch and not monitoring salt intake. He does not usually eat breakfast. I discussed reducing sodium in his diet to less than 2 g daily and encouraged him to eat a nutritious breakfast daily. I encouraged him to get regular exercise and to stay well hydrated. I advised him to work on those lifestyle modifications and advised I will route message to Dr. Marlou Porch for additional advice. He thanked me for the call.

## 2016-06-06 NOTE — Telephone Encounter (Signed)
Agree with excellent recs. His first go to for BP should be his PCP. Continue to work with Dr. Ronnald Ramp on this matter. Thanks  Candee Furbish, MD

## 2016-06-09 ENCOUNTER — Telehealth: Payer: Self-pay | Admitting: Gastroenterology

## 2016-06-09 MED ORDER — MESALAMINE 400 MG PO CPDR: 1600.0000 mg | DELAYED_RELEASE_CAPSULE | Freq: Three times a day (TID) | ORAL | 1 refills | Status: DC

## 2016-06-09 NOTE — Telephone Encounter (Signed)
Patient reports that he feels better back on delzicol. He will come in and see Dr. Fuller Plan on 06/24/16 to discuss options since his insurance will not cover it. I notified that I will try and obtain some samples until he comes into the office. See phone notes from 06/05/16. I sent a rx to his local pharmacy to see if it will be covered.

## 2016-06-10 ENCOUNTER — Ambulatory Visit: Payer: BLUE CROSS/BLUE SHIELD | Admitting: Physician Assistant

## 2016-06-11 NOTE — Telephone Encounter (Signed)
Patient notified to come pick up the samples of delzicol

## 2016-06-12 ENCOUNTER — Ambulatory Visit (INDEPENDENT_AMBULATORY_CARE_PROVIDER_SITE_OTHER): Payer: BLUE CROSS/BLUE SHIELD | Admitting: Cardiology

## 2016-06-12 ENCOUNTER — Encounter: Payer: Self-pay | Admitting: Cardiology

## 2016-06-12 VITALS — BP 166/100 | HR 69 | Ht 73.0 in | Wt 237.0 lb

## 2016-06-12 DIAGNOSIS — I4510 Unspecified right bundle-branch block: Secondary | ICD-10-CM | POA: Diagnosis not present

## 2016-06-12 DIAGNOSIS — I10 Essential (primary) hypertension: Secondary | ICD-10-CM

## 2016-06-12 DIAGNOSIS — E785 Hyperlipidemia, unspecified: Secondary | ICD-10-CM

## 2016-06-12 MED ORDER — AMLODIPINE BESYLATE 10 MG PO TABS: 10.0000 mg | ORAL_TABLET | Freq: Every day | ORAL | 3 refills | Status: DC

## 2016-06-12 MED ORDER — METOPROLOL SUCCINATE ER 50 MG PO TB24: 50.0000 mg | ORAL_TABLET | Freq: Every day | ORAL | 3 refills | Status: DC

## 2016-06-12 NOTE — Progress Notes (Signed)
Cardiology Office Note    Date:  06/12/2016   ID:  Patrick Floyd, DOB [birth year], MRN [id number]  PCP:  Scarlette Calico, MD  Cardiologist:   Candee Furbish, MD     History of Present Illness:  Patrick Floyd is a 71 y.o. male here for Blood pressure, fatigue, follow-up   Has a history of diabetes with hemoglobin A1c 6.8, essential hypertension with prior peripheral edema from amlodipine, hyperparathyroidism and hyperlipidemia.  We've discussed a stress test and echo. He's not having any chest pains, no fevers, no chills, no orthopnea, no syncope.  His blood pressure has been elevated recently. Perhaps hyperparathyroidism is playing a role in this. He is going to see endocrinology soon.  He once again wishes to go back to his amlodipine and Toprol. See below. We will resume. Stop Bystolic.   Past Medical History:  Diagnosis Date  . Anal fissure   . Basal cell carcinoma   . Diverticulosis of colon   . GERD (gastroesophageal reflux disease)   . Headache(784.0)   . Hemorrhoids, internal   . Hyperlipidemia   . Hyperplastic colonic polyp   . Hypertension   . IBS (irritable bowel syndrome)   . Seasonal allergies   . Ulcerative colitis     Past Surgical History:  Procedure Laterality Date  . COLONOSCOPY    . none      Current Medications: Outpatient Medications Prior to Visit  Medication Sig Dispense Refill  . aspirin EC 81 MG tablet Take 1 tablet (81 mg total) by mouth daily. 90 tablet 3  . cholecalciferol (VITAMIN D) 1000 units tablet Take 1,000 Units by mouth daily.    . Mesalamine (DELZICOL) 400 MG CPDR DR capsule Take 4 capsules (1,600 mg total) by mouth 3 (three) times daily. 360 capsule 1  . Vitamin D, Ergocalciferol, (DRISDOL) 50000 units CAPS capsule Take 1 capsule (50,000 Units total) by mouth 3 (three) times a week. 6 capsule 0  . nebivolol (BYSTOLIC) 10 MG tablet Take 1 tablet (10 mg total) by mouth daily. 90 tablet 3  . balsalazide (COLAZAL) 750 MG capsule  Take 2 capsules (1,500 mg total) by mouth 3 (three) times daily. (Patient not taking: Reported on 06/12/2016) 180 capsule 11   No facility-administered medications prior to visit.      Allergies:   Amlodipine   Social History   Social History  . Marital status: Married    Spouse name: N/A  . Number of children: N/A  . Years of education: N/A   Social History Main Topics  . Smoking status: Former Smoker    Quit date: 12/05/1979  . Smokeless tobacco: Never Used  . Alcohol use No     Comment: rare  . Drug use: No  . Sexual activity: Not Asked   Other Topics Concern  . 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 Marriage is in good health. Work is usual amount of stress.      Family History:  The patient's family history includes Breast cancer in his mother; Heart disease in his father; Hypertension in his other.   ROS:   Please see the history of present illness.    ROS unless specified above all other review of systems negative.  PHYSICAL EXAM:   VS:  BP (!) 166/100   Pulse  69   Ht 6' 1"  (1.854 m)   Wt 237 lb (107.5 kg)   SpO2 94%   BMI 31.27 kg/m    GEN: Well nourished, well developed, in no acute distress  HEENT: normal  Neck: no JVD, carotid bruits, or masses Cardiac: RRR; no murmurs, rubs, or gallops,Mild peripheral edema  Respiratory:  clear to auscultation bilaterally, normal work of breathing GI: soft, nontender, nondistended, + BS MS: no deformity or atrophy  Skin: warm and dry, no rash, Mild peripheral edema Neuro:  Alert and Oriented x 3, Strength and sensation are intact Psych: euthymic mood, full affect  Wt Readings from Last 3 Encounters:  06/12/16 237 lb (107.5 kg)  03/23/16 241 lb (109.3 kg)  03/20/16 238 lb (108 kg)      Studies/Labs Reviewed:   EKG:   EKG is ordered today.  The ekg ordered today demonstrates Sinus rhythm, right bundle branch block, left anterior fascicular block, bifascicular blockPersonally viewed  Recent Labs: 02/13/2016: BUN 21; Creatinine, Ser 0.87; Hemoglobin 14.3; Magnesium 2.2; Platelets 237.0; Potassium 3.6; Sodium 139; TSH 2.21 04/06/2016: ALT 13   Lipid Panel    Component Value Date/Time   CHOL 184 02/13/2016 0734   TRIG 107.0 02/13/2016 0734   HDL 40.40 02/13/2016 0734   CHOLHDL 5 02/13/2016 0734   VLDL 21.4 02/13/2016 0734   LDLCALC 123 (H) 02/13/2016 0734   LDLDIRECT 166.7 08/24/2012 1218    Additional studies/ records that were reviewed today include:  EKG, lab work, office notes reviewed    ASSESSMENT:    1. Essential hypertension   2. Hyperlipidemia with target LDL less than 100   3. RBBB      PLAN:  In order of problems listed above:  Essential hypertension  - His blood pressures have been elevated. He states that he never had headaches and felt better with better blood pressure on his old blood pressure regimen when Dr. Purcell Nails and Dr. Ron Parker where his physicians. We will switch him back to amlodipine 10 mg and Toprol-XL 50 mg as he was on previously. We will stop his Bystolic.  Right bundle-branch block/left anterior fascicular block  - Echocardiogram with normal ejection fraction. I personally viewed echo and I do not think that he has severe LVH.  - Stress test was also reassuring with no evidence of ischemia.  Diabetes mellitus  - Dr. Scarlette Calico has been watching closely.  - Dietary management  - Stable  Hyperlipidemia   - Agree with utilization of statin especially in the setting of diabetes mellitus but he does not wish to take.  Hyperparathyroidism  - This can be exacerbating hypertension. Dr. Loanne Drilling has recommended surgery.    Medication Adjustments/Labs and Tests Ordered: Current medicines are reviewed at length with the patient today.  Concerns regarding medicines  are outlined above.  Medication changes, Labs and Tests ordered today are listed in the Patient Instructions below. Patient Instructions  Medication Instructions:  Please stop your Bystolic and start Amlodipine 10 mg a day and Metoprolol XL 50 mg a day. Continue all other medications as listed.  Follow-Up: Follow up in 6 months with Dr. Marlou Porch.  You will receive a letter in the mail 2 months before you are due.  Please call us when you receive this letter to schedule your follow up appointment.  If you need a refill on your cardiac medications before your next appointment, please call your pharmacy.  Thank you for choosing [facility]!!  Signed, Candee Furbish, MD  06/12/2016 5:27 PM    [facility] [place], [place], [place]  [address] Phone: [phone]; Fax: [phone]

## 2016-06-12 NOTE — Patient Instructions (Signed)
Medication Instructions:  Please stop your Bystolic and start Amlodipine 10 mg a day and Metoprolol XL 50 mg a day. Continue all other medications as listed.  Follow-Up: Follow up in 6 months with Dr. Marlou Porch.  You will receive a letter in the mail 2 months before you are due.  Please call us when you receive this letter to schedule your follow up appointment.  If you need a refill on your cardiac medications before your next appointment, please call your pharmacy.  Thank you for choosing [facility]!!

## 2016-06-15 NOTE — Telephone Encounter (Signed)
Per cover my meds, Approved today: CaseId:[id number];Status:Approved;Review Type:Prior Auth;Coverage Start Date:05/16/2016;Coverage End Date:06/15/2017

## 2016-06-24 ENCOUNTER — Ambulatory Visit (INDEPENDENT_AMBULATORY_CARE_PROVIDER_SITE_OTHER): Payer: BLUE CROSS/BLUE SHIELD | Admitting: Gastroenterology

## 2016-06-24 ENCOUNTER — Encounter: Payer: Self-pay | Admitting: Gastroenterology

## 2016-06-24 VITALS — BP 150/86 | HR 80 | Ht 73.0 in | Wt 233.4 lb

## 2016-06-24 DIAGNOSIS — Z86010 Personal history of colonic polyps: Secondary | ICD-10-CM | POA: Diagnosis not present

## 2016-06-24 DIAGNOSIS — R194 Change in bowel habit: Secondary | ICD-10-CM

## 2016-06-24 DIAGNOSIS — K5130 Ulcerative (chronic) rectosigmoiditis without complications: Secondary | ICD-10-CM | POA: Diagnosis not present

## 2016-06-24 MED ORDER — MESALAMINE 400 MG PO CPDR: 1600.0000 mg | DELAYED_RELEASE_CAPSULE | Freq: Three times a day (TID) | ORAL | 11 refills | Status: DC

## 2016-06-24 MED ORDER — MESALAMINE 400 MG PO CPDR: 1600.0000 mg | DELAYED_RELEASE_CAPSULE | Freq: Three times a day (TID) | ORAL | 3 refills | Status: DC

## 2016-06-24 NOTE — Progress Notes (Signed)
    History of Present Illness: This is a 71 year old male with left sided ulcerative. Relates symptoms are under very good control on this regimen. He does note his bowels changed from a bowel movement every day to every other day to 2-3 bowel movements per day. Bowel movements generally occur after meals without urgency. There is no bleeding, abdominal pain or mucus. Weight is stable. Colonoscopy 12/2015: 2 adenomatous polyps, random biopsies for UC surveillance were negative. Blood work from December 2017 and February 2018 reviewed. He has mild hypercalcemia and was diagnosed with hyperparathyroidism. Surgical consultation is planned.  Current Medications, Allergies, Past Medical History, Past Surgical History, Family History and Social History were reviewed in [employer] record.  Physical Exam: General: Well developed, well nourished, no acute distress Head: Normocephalic and atraumatic Eyes:  sclerae anicteric, EOMI Ears: Normal auditory acuity Mouth: No deformity or lesions Lungs: Clear throughout to auscultation Heart: Regular rate and rhythm; no murmurs, rubs or bruits Abdomen: Soft, non tender and non distended. No masses, hepatosplenomegaly or hernias noted. Normal Bowel sounds Musculoskeletal: Symmetrical with no gross deformities  Pulses:  Normal pulses noted Extremities: No clubbing, cyanosis, edema or deformities noted Neurological: Alert oriented x 4, grossly nonfocal Psychological:  Alert and cooperative. Normal mood and affect  Assessment and Recommendations:  1. Left-sided ulcerative colitis. Slight change in bowel habits. His last colonoscopy did not show any colitis activity. If he has any increase in diarrhea, any rectal bleeding or mucus per rectum he is advised to return for follow-up otherwise return visit in one year. Continue Delzicol 1.6 g 3 tid. Surveillance colonoscopy October 2020.  2. Personal history of adenomatous colon polyps.  Surveillance colonoscopy October 2020.  3. Hyperparathyroidism and hypercalcemia.   I spent 15 minutes of face-to-face time with the patient. Greater than 50% of the time was spent counseling and coordinating care.

## 2016-06-24 NOTE — Patient Instructions (Signed)
We have sent the following medications to your pharmacy for you to pick up at your convenience: Delzicol.   Thank you for choosing me and [facility].  Pricilla Riffle. Dagoberto Ligas., MD., Marval Regal

## 2016-06-26 DIAGNOSIS — Z87891 Personal history of nicotine dependence: Secondary | ICD-10-CM | POA: Diagnosis not present

## 2016-06-26 DIAGNOSIS — E210 Primary hyperparathyroidism: Secondary | ICD-10-CM | POA: Diagnosis not present

## 2016-08-20 DIAGNOSIS — D351 Benign neoplasm of parathyroid gland: Secondary | ICD-10-CM | POA: Diagnosis not present

## 2016-08-20 DIAGNOSIS — K5190 Ulcerative colitis, unspecified, without complications: Secondary | ICD-10-CM | POA: Diagnosis not present

## 2016-08-20 DIAGNOSIS — Z85828 Personal history of other malignant neoplasm of skin: Secondary | ICD-10-CM | POA: Diagnosis not present

## 2016-08-20 DIAGNOSIS — E210 Primary hyperparathyroidism: Secondary | ICD-10-CM | POA: Diagnosis not present

## 2016-08-20 DIAGNOSIS — I10 Essential (primary) hypertension: Secondary | ICD-10-CM | POA: Diagnosis not present

## 2016-08-20 DIAGNOSIS — Z79899 Other long term (current) drug therapy: Secondary | ICD-10-CM | POA: Diagnosis not present

## 2016-08-20 DIAGNOSIS — E785 Hyperlipidemia, unspecified: Secondary | ICD-10-CM | POA: Diagnosis not present

## 2016-08-20 DIAGNOSIS — E119 Type 2 diabetes mellitus without complications: Secondary | ICD-10-CM | POA: Diagnosis not present

## 2016-08-20 DIAGNOSIS — Z87891 Personal history of nicotine dependence: Secondary | ICD-10-CM | POA: Diagnosis not present

## 2016-08-20 HISTORY — PX: PARATHYROIDECTOMY: SHX19

## 2016-08-28 DIAGNOSIS — E210 Primary hyperparathyroidism: Secondary | ICD-10-CM | POA: Diagnosis not present

## 2016-08-28 DIAGNOSIS — E892 Postprocedural hypoparathyroidism: Secondary | ICD-10-CM | POA: Diagnosis not present

## 2016-08-28 DIAGNOSIS — Z4889 Encounter for other specified surgical aftercare: Secondary | ICD-10-CM | POA: Diagnosis not present

## 2017-02-05 DIAGNOSIS — D170 Benign lipomatous neoplasm of skin and subcutaneous tissue of head, face and neck: Secondary | ICD-10-CM | POA: Diagnosis not present

## 2017-02-10 ENCOUNTER — Other Ambulatory Visit: Payer: Self-pay

## 2017-02-10 ENCOUNTER — Encounter (HOSPITAL_COMMUNITY): Payer: Self-pay | Admitting: *Deleted

## 2017-02-10 NOTE — Progress Notes (Signed)
Need orders in epic.  Surgery on 02/12/17.

## 2017-02-11 NOTE — Progress Notes (Signed)
Called Triage desk at [facility] to request orders for 02/12/17 Surgery.

## 2017-02-11 NOTE — Anesthesia Preprocedure Evaluation (Signed)
Anesthesia Evaluation  Patient identified by MRN, date of birth, ID band Patient awake    Reviewed: Allergy & Precautions, NPO status , Patient's Chart, lab work & pertinent test results  History of Anesthesia Complications (+) PONV and history of anesthetic complications  Airway Mallampati: II  TM Distance: >3 FB Neck ROM: Full    Dental no notable dental hx.    Pulmonary neg pulmonary ROS, former smoker,    Pulmonary exam normal breath sounds clear to auscultation       Cardiovascular hypertension, Pt. on medications and Pt. on home beta blockers negative cardio ROS Normal cardiovascular exam Rhythm:Regular Rate:Normal     Neuro/Psych  Headaches, negative neurological ROS  negative psych ROS   GI/Hepatic negative GI ROS, Neg liver ROS, GERD  ,  Endo/Other  negative endocrine ROSdiabetes  Renal/GU negative Renal ROS  negative genitourinary   Musculoskeletal negative musculoskeletal ROS (+) Arthritis , Osteoarthritis,    Abdominal   Peds negative pediatric ROS (+)  Hematology negative hematology ROS (+)   Anesthesia Other Findings Echo 1/18 Left ventricle:  The cavity size was normal. Wall thickness was increased in a pattern of severe LVH. There was focal basal hypertrophy. Systolic function was normal. The estimated ejection fraction was in the range of 60% to 65%. Wall motion was normal; there were no regional wall motion abnormalities. Features are consistent with a pseudonormal left ventricular filling pattern, with concomitant abnormal relaxation and increased filling pressure (grade 2 diastolic dysfunction).  Reproductive/Obstetrics negative OB ROS                             Anesthesia Physical Anesthesia Plan  ASA: III  Anesthesia Plan: General   Post-op Pain Management:    Induction: Intravenous  PONV Risk Score and Plan: 3 and Ondansetron and Treatment may vary  due to age or medical condition  Airway Management Planned: Oral ETT and LMA  Additional Equipment:   Intra-op Plan:   Post-operative Plan: Extubation in OR  Informed Consent: I have reviewed the patients History and Physical, chart, labs and discussed the procedure including the risks, benefits and alternatives for the proposed anesthesia with the patient or authorized representative who has indicated his/her understanding and acceptance.     Plan Discussed with:   Anesthesia Plan Comments: (  )        Anesthesia Quick Evaluation

## 2017-02-12 ENCOUNTER — Ambulatory Visit: Payer: Self-pay | Admitting: Surgery

## 2017-02-12 ENCOUNTER — Ambulatory Visit (HOSPITAL_COMMUNITY)
Admission: RE | Admit: 2017-02-12 | Discharge: 2017-02-12 | Disposition: A | Discharge status: Home / Self Care | Payer: BLUE CROSS/BLUE SHIELD | Source: Ambulatory Visit | Attending: Surgery | Admitting: Surgery

## 2017-02-12 ENCOUNTER — Ambulatory Visit (HOSPITAL_COMMUNITY): Payer: BLUE CROSS/BLUE SHIELD | Admitting: Anesthesiology

## 2017-02-12 ENCOUNTER — Encounter (HOSPITAL_COMMUNITY): Payer: Self-pay | Admitting: *Deleted

## 2017-02-12 ENCOUNTER — Other Ambulatory Visit: Payer: Self-pay

## 2017-02-12 ENCOUNTER — Encounter (HOSPITAL_COMMUNITY)
Admission: RE | Disposition: A | Discharge status: Home / Self Care | Payer: Self-pay | Source: Ambulatory Visit | Attending: Surgery

## 2017-02-12 DIAGNOSIS — E119 Type 2 diabetes mellitus without complications: Secondary | ICD-10-CM | POA: Insufficient documentation

## 2017-02-12 DIAGNOSIS — E785 Hyperlipidemia, unspecified: Secondary | ICD-10-CM | POA: Diagnosis not present

## 2017-02-12 DIAGNOSIS — D170 Benign lipomatous neoplasm of skin and subcutaneous tissue of head, face and neck: Secondary | ICD-10-CM | POA: Diagnosis not present

## 2017-02-12 DIAGNOSIS — I4510 Unspecified right bundle-branch block: Secondary | ICD-10-CM | POA: Insufficient documentation

## 2017-02-12 DIAGNOSIS — E559 Vitamin D deficiency, unspecified: Secondary | ICD-10-CM | POA: Insufficient documentation

## 2017-02-12 DIAGNOSIS — E210 Primary hyperparathyroidism: Secondary | ICD-10-CM | POA: Insufficient documentation

## 2017-02-12 DIAGNOSIS — I10 Essential (primary) hypertension: Secondary | ICD-10-CM | POA: Insufficient documentation

## 2017-02-12 DIAGNOSIS — E781 Pure hyperglyceridemia: Secondary | ICD-10-CM | POA: Diagnosis not present

## 2017-02-12 DIAGNOSIS — D171 Benign lipomatous neoplasm of skin and subcutaneous tissue of trunk: Secondary | ICD-10-CM | POA: Diagnosis not present

## 2017-02-12 DIAGNOSIS — D1779 Benign lipomatous neoplasm of other sites: Secondary | ICD-10-CM | POA: Insufficient documentation

## 2017-02-12 DIAGNOSIS — Z85828 Personal history of other malignant neoplasm of skin: Secondary | ICD-10-CM | POA: Diagnosis not present

## 2017-02-12 DIAGNOSIS — Z6830 Body mass index (BMI) 30.0-30.9, adult: Secondary | ICD-10-CM | POA: Insufficient documentation

## 2017-02-12 DIAGNOSIS — Z87891 Personal history of nicotine dependence: Secondary | ICD-10-CM | POA: Insufficient documentation

## 2017-02-12 DIAGNOSIS — E669 Obesity, unspecified: Secondary | ICD-10-CM | POA: Diagnosis not present

## 2017-02-12 DIAGNOSIS — K589 Irritable bowel syndrome without diarrhea: Secondary | ICD-10-CM | POA: Diagnosis not present

## 2017-02-12 DIAGNOSIS — M1990 Unspecified osteoarthritis, unspecified site: Secondary | ICD-10-CM | POA: Insufficient documentation

## 2017-02-12 DIAGNOSIS — K219 Gastro-esophageal reflux disease without esophagitis: Secondary | ICD-10-CM | POA: Insufficient documentation

## 2017-02-12 DIAGNOSIS — Z8719 Personal history of other diseases of the digestive system: Secondary | ICD-10-CM | POA: Diagnosis not present

## 2017-02-12 DIAGNOSIS — D1722 Benign lipomatous neoplasm of skin and subcutaneous tissue of left arm: Secondary | ICD-10-CM | POA: Diagnosis not present

## 2017-02-12 DIAGNOSIS — R222 Localized swelling, mass and lump, trunk: Secondary | ICD-10-CM | POA: Diagnosis not present

## 2017-02-12 DIAGNOSIS — R221 Localized swelling, mass and lump, neck: Secondary | ICD-10-CM | POA: Diagnosis present

## 2017-02-12 HISTORY — PX: MASS EXCISION: SHX2000

## 2017-02-12 HISTORY — DX: Adverse effect of unspecified anesthetic, initial encounter: T41.45XA

## 2017-02-12 HISTORY — DX: Other specified postprocedural states: Z98.890

## 2017-02-12 HISTORY — DX: Other complications of anesthesia, initial encounter: T88.59XA

## 2017-02-12 HISTORY — DX: Nausea with vomiting, unspecified: R11.2

## 2017-02-12 LAB — BASIC METABOLIC PANEL
Anion gap: 9 (ref 5–15)
BUN: 22 mg/dL — ABNORMAL HIGH (ref 6–20)
CO2: 27 mmol/L (ref 22–32)
Calcium: 9 mg/dL (ref 8.9–10.3)
Chloride: 102 mmol/L (ref 101–111)
Creatinine, Ser: 0.78 mg/dL (ref 0.61–1.24)
GFR calc Af Amer: >60 mL/min (ref >60)
GFR calc non Af Amer: >60 mL/min (ref >60)
Glucose, Bld: 193 mg/dL — ABNORMAL HIGH (ref 65–99)
Potassium: 3.7 mmol/L (ref 3.5–5.1)
Sodium: 138 mmol/L (ref 135–145)

## 2017-02-12 LAB — CBC
HCT: 40.8 % (ref 39.0–52.0)
Hemoglobin: 13.9 g/dL (ref 13.0–17.0)
MCH: 29.2 pg (ref 26.0–34.0)
MCHC: 34.1 g/dL (ref 30.0–36.0)
MCV: 85.7 fL (ref 78.0–100.0)
Platelets: 239 10*3/uL (ref 150–400)
RBC: 4.76 MIL/uL (ref 4.22–5.81)
RDW: 13.6 % (ref 11.5–15.5)
WBC: 9.8 10*3/uL (ref 4.0–10.5)

## 2017-02-12 SURGERY — EXCISION MASS
Anesthesia: General | Site: Back | Laterality: Left

## 2017-02-12 MED ORDER — 0.9 % SODIUM CHLORIDE (POUR BTL) OPTIME
TOPICAL | Status: DC | PRN
  Administered 2017-02-12: 1000 mL

## 2017-02-12 MED ORDER — MEPERIDINE HCL 50 MG/ML IJ SOLN: 6.2500 mg | INTRAMUSCULAR | Status: DC | PRN

## 2017-02-12 MED ORDER — DEXAMETHASONE SODIUM PHOSPHATE 10 MG/ML IJ SOLN
INTRAMUSCULAR | Status: AC
  Filled 2017-02-12: qty 1

## 2017-02-12 MED ORDER — EPHEDRINE SULFATE-NACL 50-0.9 MG/10ML-% IV SOSY
PREFILLED_SYRINGE | INTRAVENOUS | Status: DC | PRN
  Administered 2017-02-12 (×2): 10 mg via INTRAVENOUS

## 2017-02-12 MED ORDER — LIDOCAINE HCL (PF) 1 % IJ SOLN
INTRAMUSCULAR | Status: AC
  Filled 2017-02-12: qty 30

## 2017-02-12 MED ORDER — FENTANYL CITRATE (PF) 100 MCG/2ML IJ SOLN
INTRAMUSCULAR | Status: AC
  Filled 2017-02-12: qty 2

## 2017-02-12 MED ORDER — ACETAMINOPHEN 500 MG PO TABS
1000.0000 mg | ORAL_TABLET | ORAL | Status: AC
  Administered 2017-02-12: 1000 mg via ORAL
  Filled 2017-02-12: qty 2

## 2017-02-12 MED ORDER — SODIUM CHLORIDE 0.9% FLUSH: 3.0000 mL | INTRAVENOUS | Status: DC | PRN

## 2017-02-12 MED ORDER — CHLORHEXIDINE GLUCONATE CLOTH 2 % EX PADS: 6.0000 | MEDICATED_PAD | Freq: Once | CUTANEOUS | Status: DC

## 2017-02-12 MED ORDER — SODIUM CHLORIDE 0.9% FLUSH: 3.0000 mL | Freq: Two times a day (BID) | INTRAVENOUS | Status: DC

## 2017-02-12 MED ORDER — MIDAZOLAM HCL 2 MG/2ML IJ SOLN
INTRAMUSCULAR | Status: AC
  Filled 2017-02-12: qty 2

## 2017-02-12 MED ORDER — DEXAMETHASONE SODIUM PHOSPHATE 10 MG/ML IJ SOLN
INTRAMUSCULAR | Status: DC | PRN
  Administered 2017-02-12: 10 mg via INTRAVENOUS

## 2017-02-12 MED ORDER — BUPIVACAINE-EPINEPHRINE 0.25% -1:200000 IJ SOLN
INTRAMUSCULAR | Status: AC
  Filled 2017-02-12: qty 1

## 2017-02-12 MED ORDER — HEPARIN SODIUM (PORCINE) 5000 UNIT/ML IJ SOLN
5000.0000 [IU] | Freq: Once | INTRAMUSCULAR | Status: AC
  Administered 2017-02-12: 5000 [IU] via SUBCUTANEOUS
  Filled 2017-02-12: qty 1

## 2017-02-12 MED ORDER — ROCURONIUM BROMIDE 10 MG/ML (PF) SYRINGE
PREFILLED_SYRINGE | INTRAVENOUS | Status: DC | PRN
  Administered 2017-02-12: 30 mg via INTRAVENOUS

## 2017-02-12 MED ORDER — MIDAZOLAM HCL 5 MG/5ML IJ SOLN
INTRAMUSCULAR | Status: DC | PRN
  Administered 2017-02-12: 2 mg via INTRAVENOUS

## 2017-02-12 MED ORDER — ROCURONIUM BROMIDE 50 MG/5ML IV SOSY
PREFILLED_SYRINGE | INTRAVENOUS | Status: AC
  Filled 2017-02-12: qty 5

## 2017-02-12 MED ORDER — ONDANSETRON HCL 4 MG/2ML IJ SOLN
INTRAMUSCULAR | Status: DC | PRN
Start: 2017-02-12 — End: 2017-02-12
  Administered 2017-02-12: 4 mg via INTRAVENOUS

## 2017-02-12 MED ORDER — EPHEDRINE 5 MG/ML INJ
INTRAVENOUS | Status: AC
  Filled 2017-02-12: qty 10

## 2017-02-12 MED ORDER — GABAPENTIN 300 MG PO CAPS
300.0000 mg | ORAL_CAPSULE | ORAL | Status: AC
  Administered 2017-02-12: 300 mg via ORAL
  Filled 2017-02-12: qty 1

## 2017-02-12 MED ORDER — ONDANSETRON HCL 4 MG/2ML IJ SOLN
INTRAMUSCULAR | Status: AC
  Filled 2017-02-12: qty 2

## 2017-02-12 MED ORDER — FENTANYL CITRATE (PF) 100 MCG/2ML IJ SOLN: 25.0000 ug | INTRAMUSCULAR | Status: DC | PRN

## 2017-02-12 MED ORDER — LIDOCAINE 2% (20 MG/ML) 5 ML SYRINGE
INTRAMUSCULAR | Status: AC
  Filled 2017-02-12: qty 5

## 2017-02-12 MED ORDER — OXYCODONE HCL 5 MG PO TABS: 5.0000 mg | ORAL_TABLET | ORAL | Status: DC | PRN

## 2017-02-12 MED ORDER — ACETAMINOPHEN 650 MG RE SUPP
650.0000 mg | RECTAL | Status: DC | PRN
  Filled 2017-02-12: qty 1

## 2017-02-12 MED ORDER — SODIUM CHLORIDE 0.9 % IV SOLN: 250.0000 mL | INTRAVENOUS | Status: DC | PRN

## 2017-02-12 MED ORDER — HYDROCODONE-ACETAMINOPHEN 5-325 MG PO TABS: 1.0000 | ORAL_TABLET | ORAL | 0 refills | Status: DC | PRN

## 2017-02-12 MED ORDER — BUPIVACAINE LIPOSOME 1.3 % IJ SUSP
20.0000 mL | Freq: Once | INTRAMUSCULAR | Status: AC
  Administered 2017-02-12: 20 mL
  Filled 2017-02-12: qty 20

## 2017-02-12 MED ORDER — PROPOFOL 10 MG/ML IV BOLUS
INTRAVENOUS | Status: DC | PRN
  Administered 2017-02-12: 20 mg via INTRAVENOUS
  Administered 2017-02-12: 180 mg via INTRAVENOUS

## 2017-02-12 MED ORDER — LACTATED RINGERS IV SOLN
INTRAVENOUS | Status: DC
  Administered 2017-02-12: 07:00:00 via INTRAVENOUS

## 2017-02-12 MED ORDER — CEFOTETAN DISODIUM-DEXTROSE 2-2.08 GM-%(50ML) IV SOLR
2.0000 g | INTRAVENOUS | Status: AC
  Administered 2017-02-12: 2 g via INTRAVENOUS
  Filled 2017-02-12: qty 50

## 2017-02-12 MED ORDER — ACETAMINOPHEN 500 MG PO TABS: 1000.0000 mg | ORAL_TABLET | Freq: Four times a day (QID) | ORAL | Status: DC

## 2017-02-12 MED ORDER — FENTANYL CITRATE (PF) 100 MCG/2ML IJ SOLN
INTRAMUSCULAR | Status: DC | PRN
  Administered 2017-02-12: 50 ug via INTRAVENOUS

## 2017-02-12 MED ORDER — CELECOXIB 200 MG PO CAPS
200.0000 mg | ORAL_CAPSULE | ORAL | Status: AC
  Administered 2017-02-12: 200 mg via ORAL
  Filled 2017-02-12: qty 1

## 2017-02-12 MED ORDER — SUGAMMADEX SODIUM 200 MG/2ML IV SOLN
INTRAVENOUS | Status: AC
  Filled 2017-02-12: qty 2

## 2017-02-12 MED ORDER — PROPOFOL 10 MG/ML IV BOLUS
INTRAVENOUS | Status: AC
  Filled 2017-02-12: qty 20

## 2017-02-12 MED ORDER — LIDOCAINE 2% (20 MG/ML) 5 ML SYRINGE
INTRAMUSCULAR | Status: DC | PRN
Start: 2017-02-12 — End: 2017-02-12
  Administered 2017-02-12: 100 mg via INTRAVENOUS

## 2017-02-12 MED ORDER — ACETAMINOPHEN 325 MG PO TABS: 650.0000 mg | ORAL_TABLET | ORAL | Status: DC | PRN

## 2017-02-12 MED ORDER — SUCCINYLCHOLINE CHLORIDE 200 MG/10ML IV SOSY
PREFILLED_SYRINGE | INTRAVENOUS | Status: DC | PRN
  Administered 2017-02-12: 100 mg via INTRAVENOUS

## 2017-02-12 MED ORDER — SUGAMMADEX SODIUM 200 MG/2ML IV SOLN
INTRAVENOUS | Status: DC | PRN
  Administered 2017-02-12: 200 mg via INTRAVENOUS

## 2017-02-12 SURGICAL SUPPLY — 30 items
BENZOIN TINCTURE PRP APPL 2/3 (GAUZE/BANDAGES/DRESSINGS) IMPLANT
BINDER BREAST LRG (GAUZE/BANDAGES/DRESSINGS) IMPLANT
BINDER BREAST XLRG (GAUZE/BANDAGES/DRESSINGS) IMPLANT
BLADE HEX COATED 2.75 (ELECTRODE) ×2 IMPLANT
BLADE SURG 15 STRL LF DISP TIS (BLADE) ×1 IMPLANT
BLADE SURG 15 STRL SS (BLADE) ×1
DECANTER SPIKE VIAL GLASS SM (MISCELLANEOUS) IMPLANT
DERMABOND ADVANCED (GAUZE/BANDAGES/DRESSINGS) ×1
DERMABOND ADVANCED .7 DNX12 (GAUZE/BANDAGES/DRESSINGS) ×1 IMPLANT
DRAPE LAPAROTOMY T 102X78X121 (DRAPES) IMPLANT
ELECT PENCIL ROCKER SW 15FT (MISCELLANEOUS) ×2 IMPLANT
ELECT REM PT RETURN 15FT ADLT (MISCELLANEOUS) ×2 IMPLANT
GAUZE PACKING IODOFORM 1/4X15 (GAUZE/BANDAGES/DRESSINGS) IMPLANT
GAUZE SPONGE 4X4 12PLY STRL (GAUZE/BANDAGES/DRESSINGS) ×2 IMPLANT
GLOVE BIOGEL M 8.0 STRL (GLOVE) ×2 IMPLANT
GOWN STRL REUS W/TWL XL LVL3 (GOWN DISPOSABLE) ×4 IMPLANT
KIT BASIN OR (CUSTOM PROCEDURE TRAY) ×2 IMPLANT
MARKER SKIN DUAL TIP RULER LAB (MISCELLANEOUS) ×2 IMPLANT
NEEDLE HYPO 22GX1.5 SAFETY (NEEDLE) ×2 IMPLANT
PACK BASIC VI WITH GOWN DISP (CUSTOM PROCEDURE TRAY) ×2 IMPLANT
PAD TELFA 2X3 NADH STRL (GAUZE/BANDAGES/DRESSINGS) IMPLANT
SPONGE LAP 18X18 X RAY DECT (DISPOSABLE) ×2 IMPLANT
SPONGE LAP 4X18 X RAY DECT (DISPOSABLE) ×2 IMPLANT
STAPLER VISISTAT 35W (STAPLE) IMPLANT
SUT ETHILON 3 0 PS 1 (SUTURE) ×2 IMPLANT
SUT MNCRL AB 4-0 PS2 18 (SUTURE) ×2 IMPLANT
SUT VIC AB 2-0 SH 18 (SUTURE) ×2 IMPLANT
SUT VIC AB 4-0 SH 18 (SUTURE) IMPLANT
SYR 20CC LL (SYRINGE) ×2 IMPLANT
YANKAUER SUCT BULB TIP 10FT TU (MISCELLANEOUS) ×2 IMPLANT

## 2017-02-12 NOTE — Op Note (Addendum)
Surgeon: Kaylyn Lim, MD, FACS  Asst:  none  Anes:  general  Preop Dx: 8 cm lipomatous mass of the neck/shoulder left Postop Dx: 6 x 6 cm mass with ellipse of skin (suture on superior margin)  Procedure: Excision of mass of the neck shoulder junction on the left Location Surgery: [facility] 1 Complications: None noted  EBL:   8 cc  Drains: none  Description of Procedure:  The patient was taken to OR 1 .  After anesthesia was administered and the patient was prepped a timeout was performed.  The patient had been previously marked and an ellipse was described along that line transversely.  The mass had a gross measurement of 8 cm in both planes prior to excision.  After it was removed it was measured and felt to be at 6 cm and was largely circular and approximately 3-4 cm thick.  I created superior and inferior flaps using the Bovie and used the Bovie to do most all this dissection because I did not know whether this was a liposarcoma or a lipoma.  I created the flexion fairly inferiorly and then I went down to the muscle and then began to dissected with along the plane of the muscle with the Bovie.  It was somewhat tedious but was accomplished and the mass was removed in toto.  Hemostasis was present.  Exparel 20 cc was injected.    I closed the flaps with inverted 2-0 Vicryl and then the skin was closed with a running subcuticular 4-0 Monocryl and Dermabond.  The patient tolerated the procedure well and was taken to the PACU in stable condition.     Matt B. Hassell Done, [place], [facility], [place]

## 2017-02-12 NOTE — H&P (Deleted)
  The note originally documented on this encounter has been moved the the encounter in which it belongs.  

## 2017-02-12 NOTE — H&P (Signed)
Chief Complaint:  Large lipomatous mass of the left shoulder  History of Present Illness:  Patrick Floyd is an 71 y.o. male who is followed by Dr. Lavonna Monarch with an increasing mass on his left shoulder.  He was referred by Dr. Denna Haggard for excision.    Past Medical History:  Diagnosis Date  . Anal fissure   . Basal cell carcinoma   . Complication of anesthesia   . Diverticulosis of colon   . GERD (gastroesophageal reflux disease)   . Headache(784.0)   . Hemorrhoids, internal   . Hyperlipidemia   . Hyperparathyroidism ([place]) 01/2016  . Hyperplastic colonic polyp   . Hypertension   . IBS (irritable bowel syndrome)   . PONV (postoperative nausea and vomiting)   . Seasonal allergies   . Ulcerative colitis     Past Surgical History:  Procedure Laterality Date  . COLONOSCOPY    . none    . PARATHYROIDECTOMY      No current facility-administered medications for this visit.    No current outpatient medications on file.   Facility-Administered Medications Ordered in Other Visits  Medication Dose Route Frequency Provider Last Rate Last Dose  . lactated ringers infusion   Intravenous Continuous Janeece Riggers, MD 20 mL/hr at 02/12/17 0700     Patient has no known allergies. Family History  Problem Relation Age of Onset  . Hypertension Other   . Breast cancer Mother   . Heart disease Father   . Colon cancer Neg Hx   . Stomach cancer Neg Hx   . Cancer Neg Hx        Colon and Prostate  . Hyperparathyroidism Neg Hx    Social History:   reports that he quit smoking about 37 years ago. he has never used smokeless tobacco. He reports that he does not drink alcohol or use drugs.   REVIEW OF SYSTEMS : Negative except for see problem list  Physical Exam:   There were no vitals taken for this visit. There is no height or weight on file to calculate BMI.  Gen:  WDWN WM NAD  Neurological: Alert and oriented to person, place, and time. Motor and sensory function is grossly  intact  Head: Normocephalic and atraumatic.  Eyes: Conjunctivae are normal. Pupils are equal, round, and reactive to light. No scleral icterus.  Neck: Normal range of motion. Neck supple. No tracheal deviation or thyromegaly present.  On the left shoulder there is a 5 cm mass of the neck shoulder junction Cardiovascular:  SR without murmurs or gallops.  No carotid bruits Breast:  Not examined Respiratory: Effort normal.  No respiratory distress. No chest wall tenderness. Breath sounds normal.  No wheezes, rales or rhonchi.  Abdomen:  nontender GU:  Not examined Musculoskeletal: Normal range of motion. Extremities are nontender. No cyanosis, edema or clubbing noted Lymphadenopathy: No cervical, preauricular, postauricular or axillary adenopathy is present Skin: Skin is warm and dry. No rash noted. No diaphoresis. No erythema. No pallor. Pscyh: Normal mood and affect. Behavior is normal. Judgment and thought content normal.   LABORATORY RESULTS: Results for orders placed or performed during the hospital encounter of 02/12/17 (from the past 48 hour(s))  CBC     Status: None   Collection Time: 02/12/17  6:30 AM  Result Value Ref Range   WBC 9.8 4.0 - 10.5 K/uL   RBC 4.76 4.22 - 5.81 MIL/uL   Hemoglobin 13.9 13.0 - 17.0 g/dL   HCT 40.8 39.0 - 52.0 %  MCV 85.7 78.0 - 100.0 fL   MCH 29.2 26.0 - 34.0 pg   MCHC 34.1 30.0 - 36.0 g/dL   RDW 13.6 11.5 - 15.5 %   Platelets 239 150 - 400 K/uL     RADIOLOGY RESULTS: No results found.  Problem List: Patient Active Problem List   Diagnosis Date Noted  . Primary hyperparathyroidism ([place]) 03/24/2016  . RBBB 03/12/2016  . Vitamin D deficiency 02/18/2016  . Hearing loss of both ears due to cerumen impaction 02/18/2016  . Alkaline phosphatase elevation 02/17/2016  . Benign prostatic hyperplasia without lower urinary tract symptoms 02/17/2016  . Nonspecific abnormal electrocardiogram (ECG) (EKG) 02/17/2016  . DOE (dyspnea on exertion) 02/17/2016   . Pure hyperglyceridemia 02/12/2016  . Hypercalcemia 02/12/2016  . Hip arthritis 03/08/2014  . Routine health maintenance 12/15/2010  . Lipoma of skin and subcutaneous tissue of neck 11/13/2009  . OBESITY, CLASS I 11/13/2009  . Type II diabetes mellitus with manifestations ([place]) 09/14/2008  . Hyperlipidemia with target LDL less than 100 06/20/2007  . Essential hypertension 06/20/2007  . Ulcerative colitis ([place]) 06/20/2007    Assessment & Plan: Mass of the left neck/shoulder for excision    Matt B. Hassell Done, MD, [facility] [phone] beeper [phone]  02/12/2017 7:20 AM

## 2017-02-12 NOTE — Anesthesia Postprocedure Evaluation (Signed)
Anesthesia Post Note  Patient: Patrick Floyd  Procedure(s) Performed: EXCISION 6x6 CM BACK MASS (Left Back)     Patient location during evaluation: PACU Anesthesia Type: General Level of consciousness: awake and alert Pain management: pain level controlled Vital Signs Assessment: post-procedure vital signs reviewed and stable Respiratory status: spontaneous breathing, nonlabored ventilation, respiratory function stable and patient connected to nasal cannula oxygen Cardiovascular status: blood pressure returned to baseline and stable Postop Assessment: no apparent nausea or vomiting Anesthetic complications: no    Last Vitals:  Vitals:   02/12/17 1030 02/12/17 1045  BP: (!) 155/85 (!) 157/93  Pulse: 79 75  Resp: 15 14  Temp:    SpO2: 99% 92%    Last Pain:  Vitals:   02/12/17 1023  TempSrc:   PainSc: 0-No pain                 Nashali Ditmer

## 2017-02-12 NOTE — Anesthesia Procedure Notes (Signed)
Procedure Name: Intubation Date/Time: 02/12/2017 8:38 AM Performed by: Victoriano Lain, CRNA Pre-anesthesia Checklist: Patient identified, Emergency Drugs available, Suction available, Patient being monitored and Timeout performed Patient Re-evaluated:Patient Re-evaluated prior to induction Oxygen Delivery Method: Circle system utilized Preoxygenation: Pre-oxygenation with 100% oxygen Induction Type: IV induction Ventilation: Mask ventilation without difficulty Laryngoscope Size: Mac and 4 Grade View: Grade II Tube type: Oral Tube size: 7.5 mm Number of attempts: 3 Airway Equipment and Method: Stylet Placement Confirmation: ETT inserted through vocal cords under direct vision,  positive ETCO2 and breath sounds checked- equal and bilateral Secured at: 23 cm Tube secured with: Tape Dental Injury: Teeth and Oropharynx as per pre-operative assessment  Difficulty Due To: Difficulty was unanticipated and Difficult Airway- due to anterior larynx Future Recommendations: Recommend- induction with short-acting agent, and alternative techniques readily available Comments: PreO2 with 100%. Easy mask. DL X 1 with MAC 4 by CRNA. Grade 2-3 view. - ETCO2. Masked. Head repositioned. DL by CRNA . Grade 2-3 view. DL X Dr Ambrose Pancoast. Grade 2 view. ATOI. + ETCO2. BBS=. ETT secured at 23 cm at the lip.

## 2017-02-12 NOTE — Transfer of Care (Signed)
Immediate Anesthesia Transfer of Care Note  Patient: Patrick Floyd  Procedure(s) Performed: EXCISION 6x6 CM BACK MASS (Left Back)  Patient Location: PACU  Anesthesia Type:General  Level of Consciousness: awake, alert , oriented and patient cooperative  Airway & Oxygen Therapy: Patient Spontanous Breathing and Patient connected to face mask oxygen  Post-op Assessment: Report given to RN, Post -op Vital signs reviewed and stable and Patient moving all extremities  Post vital signs: Reviewed and stable  Last Vitals:  Vitals:   02/12/17 0640  BP: (!) 169/97  Pulse: 70  Resp: 16  Temp: 37 C  SpO2: 100%    Last Pain:  Vitals:   02/12/17 0640  TempSrc: Oral      Patients Stated Pain Goal: 4 (17/91/50 5697)  Complications: No apparent anesthesia complications

## 2017-02-12 NOTE — Discharge Instructions (Signed)
May shower tomorrow.  Leave bulky dressing on until then.  You have dermabond over wound. No dressing is required after bulky dressing comes off. Check with [facility] office ~ Tuesday to find out if pathology is ready.     General Anesthesia, Adult, Care After These instructions provide you with information about caring for yourself after your procedure. Your health care provider may also give you more specific instructions. Your treatment has been planned according to current medical practices, but problems sometimes occur. Call your health care provider if you have any problems or questions after your procedure. What can I expect after the procedure? After the procedure, it is common to have:  Vomiting.  A sore throat.  Mental slowness.  It is common to feel:  Nauseous.  Cold or shivery.  Sleepy.  Tired.  Sore or achy, even in parts of your body where you did not have surgery.  Follow these instructions at home: For at least 24 hours after the procedure:  Do not: ? Participate in activities where you could fall or become injured. ? Drive. ? Use heavy machinery. ? Drink alcohol. ? Take sleeping pills or medicines that cause drowsiness. ? Make important decisions or sign legal documents. ? Take care of children on your own.  Rest. Eating and drinking  If you vomit, drink water, juice, or soup when you can drink without vomiting.  Drink enough fluid to keep your urine clear or pale yellow.  Make sure you have little or no nausea before eating solid foods.  Follow the diet recommended by your health care provider. General instructions  Have a responsible adult stay with you until you are awake and alert.  Return to your normal activities as told by your health care provider. Ask your health care provider what activities are safe for you.  Take over-the-counter and prescription medicines only as told by your health care provider.  If you smoke, do not smoke without  supervision.  Keep all follow-up visits as told by your health care provider. This is important. Contact a health care provider if:  You continue to have nausea or vomiting at home, and medicines are not helpful.  You cannot drink fluids or start eating again.  You cannot urinate after 8-12 hours.  You develop a skin rash.  You have fever.  You have increasing redness at the site of your procedure. Get help right away if:  You have difficulty breathing.  You have chest pain.  You have unexpected bleeding.  You feel that you are having a life-threatening or urgent problem. This information is not intended to replace advice given to you by your health care provider. Make sure you discuss any questions you have with your health care provider. Document Released: 05/25/2000 Document Revised: 07/22/2015 Document Reviewed: 01/31/2015 Elsevier Interactive Patient Education  [employer].

## 2017-02-13 ENCOUNTER — Encounter (HOSPITAL_COMMUNITY): Payer: Self-pay | Admitting: Surgery

## 2017-02-20 ENCOUNTER — Encounter: Payer: Self-pay | Admitting: Surgery

## 2017-02-23 ENCOUNTER — Telehealth: Payer: Self-pay | Admitting: Internal Medicine

## 2017-02-23 NOTE — Telephone Encounter (Signed)
Copied from [place]. Topic: General - Other >> Feb 22, 2017 12:23 PM Hewitt Shorts wrote: Reason for CRM: pt is requesting that order for the current labs in as an order Would like to get bloodwork for Hemoglobin A1C as suggested as well as PTH, Calcium and Vitamin D after parathyroidectomemy done 08/20/16 at [facility].and pleae call once order is in so that he can come and have labs done

## 2017-02-24 NOTE — Telephone Encounter (Signed)
Pt contacted and was informed that we are not able to order any blood work until he has an appt. LOV with PCP was 01/2016. Offered to schedule but patient refused.

## 2017-03-11 ENCOUNTER — Encounter: Payer: Self-pay | Admitting: Endocrinology

## 2017-03-15 ENCOUNTER — Ambulatory Visit: Payer: BLUE CROSS/BLUE SHIELD | Admitting: Internal Medicine

## 2017-03-15 ENCOUNTER — Ambulatory Visit (INDEPENDENT_AMBULATORY_CARE_PROVIDER_SITE_OTHER)
Admission: RE | Admit: 2017-03-15 | Discharge: 2017-03-15 | Disposition: A | Discharge status: Home / Self Care | Payer: BLUE CROSS/BLUE SHIELD | Source: Ambulatory Visit | Attending: Internal Medicine | Admitting: Internal Medicine

## 2017-03-15 ENCOUNTER — Encounter: Payer: Self-pay | Admitting: Internal Medicine

## 2017-03-15 VITALS — BP 150/90 | HR 88 | Temp 99.0°F | Resp 16 | Ht 73.0 in | Wt 228.0 lb

## 2017-03-15 DIAGNOSIS — R05 Cough: Secondary | ICD-10-CM

## 2017-03-15 DIAGNOSIS — J069 Acute upper respiratory infection, unspecified: Secondary | ICD-10-CM

## 2017-03-15 DIAGNOSIS — R6889 Other general symptoms and signs: Secondary | ICD-10-CM | POA: Diagnosis not present

## 2017-03-15 DIAGNOSIS — B9789 Other viral agents as the cause of diseases classified elsewhere: Secondary | ICD-10-CM

## 2017-03-15 DIAGNOSIS — R0602 Shortness of breath: Secondary | ICD-10-CM | POA: Diagnosis not present

## 2017-03-15 DIAGNOSIS — R059 Cough, unspecified: Secondary | ICD-10-CM

## 2017-03-15 LAB — POC INFLUENZA A&B (BINAX/QUICKVUE)
Influenza A, POC: NEGATIVE
Influenza B, POC: NEGATIVE

## 2017-03-15 MED ORDER — HYDROCODONE-HOMATROPINE 5-1.5 MG/5ML PO SYRP: 5.0000 mL | ORAL_SOLUTION | Freq: Three times a day (TID) | ORAL | 0 refills | Status: DC | PRN

## 2017-03-15 NOTE — Patient Instructions (Signed)
Cough, Adult  Coughing is a reflex that clears your throat and your airways. Coughing helps to heal and protect your lungs. It is normal to cough occasionally, but a cough that happens with other symptoms or lasts a long time may be a sign of a condition that needs treatment. A cough may last only 2-3 weeks (acute), or it may last longer than 8 weeks (chronic).  What are the causes?  Coughing is commonly caused by:   Breathing in substances that irritate your lungs.   A viral or bacterial respiratory infection.   Allergies.   Asthma.   Postnasal drip.   Smoking.   Acid backing up from the stomach into the esophagus (gastroesophageal reflux).   Certain medicines.   Chronic lung problems, including COPD (or rarely, lung cancer).   Other medical conditions such as heart failure.    Follow these instructions at home:  Pay attention to any changes in your symptoms. Take these actions to help with your discomfort:   Take medicines only as told by your health care provider.  ? If you were prescribed an antibiotic medicine, take it as told by your health care provider. Do not stop taking the antibiotic even if you start to feel better.  ? Talk with your health care provider before you take a cough suppressant medicine.   Drink enough fluid to keep your urine clear or pale yellow.   If the air is dry, use a cold steam vaporizer or humidifier in your bedroom or your home to help loosen secretions.   Avoid anything that causes you to cough at work or at home.   If your cough is worse at night, try sleeping in a semi-upright position.   Avoid cigarette smoke. If you smoke, quit smoking. If you need help quitting, ask your health care provider.   Avoid caffeine.   Avoid alcohol.   Rest as needed.    Contact a health care provider if:   You have new symptoms.   You cough up pus.   Your cough does not get better after 2-3 weeks, or your cough gets worse.   You cannot control your cough with suppressant  medicines and you are losing sleep.   You develop pain that is getting worse or pain that is not controlled with pain medicines.   You have a fever.   You have unexplained weight loss.   You have night sweats.  Get help right away if:   You cough up blood.   You have difficulty breathing.   Your heartbeat is very fast.  This information is not intended to replace advice given to you by your health care provider. Make sure you discuss any questions you have with your health care provider.  Document Released: 08/15/2010 Document Revised: 07/25/2015 Document Reviewed: 04/25/2014  Elsevier Interactive Patient Education  2018 Elsevier Inc[employer]

## 2017-03-15 NOTE — Progress Notes (Signed)
Subjective:  Patient ID: Patrick Floyd, male    DOB: 07/01/1945  Age: 72 y.o. MRN: [id number]  CC: URI   HPI Patrick Floyd presents for a 2-day history of sore throat, headache, muscle aches, chills, sneezing, nonproductive cough, low-grade fever, and nausea.  He has gotten some symptom relief with Tylenol.  Outpatient Medications Prior to Visit  Medication Sig Dispense Refill  . Mesalamine (DELZICOL) 400 MG CPDR DR capsule Take 4 capsules (1,600 mg total) by mouth 3 (three) times daily. 1080 capsule 3  . HYDROcodone-acetaminophen (NORCO) 5-325 MG tablet Take 1 tablet by mouth every 4 (four) hours as needed for moderate pain. 20 tablet 0  . amLODipine (NORVASC) 10 MG tablet Take 1 tablet (10 mg total) by mouth daily. 180 tablet 3  . metoprolol succinate (TOPROL-XL) 50 MG 24 hr tablet Take 1 tablet (50 mg total) by mouth daily. Take with or immediately following a meal. 90 tablet 3  . cholecalciferol (VITAMIN D) 1000 units tablet Take 1,000 Units by mouth daily.     No facility-administered medications prior to visit.     ROS Review of Systems  Constitutional: Positive for chills and fever. Negative for diaphoresis and fatigue.  HENT: Positive for sore throat. Negative for trouble swallowing and voice change.   Eyes: Negative.   Respiratory: Positive for cough. Negative for chest tightness, shortness of breath and wheezing.   Cardiovascular: Negative for chest pain, palpitations and leg swelling.  Gastrointestinal: Negative for abdominal pain, diarrhea, nausea and vomiting.  Endocrine: Negative.   Genitourinary: Negative.  Negative for difficulty urinating.  Musculoskeletal: Positive for myalgias. Negative for back pain.  Skin: Negative for color change and rash.  Allergic/Immunologic: Negative.   Neurological: Positive for headaches. Negative for dizziness and weakness.  Hematological: Negative for adenopathy. Does not bruise/bleed easily.  Psychiatric/Behavioral: Negative.      Objective:  BP (!) 150/90 (BP Location: Left Arm, Patient Position: Sitting, Cuff Size: Large)   Pulse 88   Temp 99 F (37.2 C) (Oral)   Resp 16   Ht 6' 1"  (1.854 m)   Wt 228 lb (103.4 kg)   SpO2 96%   BMI 30.08 kg/m   BP Readings from Last 3 Encounters:  03/15/17 (!) 150/90  02/12/17 (!) 141/79  06/24/16 (!) 150/86    Wt Readings from Last 3 Encounters:  03/15/17 228 lb (103.4 kg)  02/12/17 229 lb (103.9 kg)  06/24/16 233 lb 6.4 oz (105.9 kg)    Physical Exam  Constitutional: He is oriented to person, place, and time.  Non-toxic appearance. He does not have a sickly appearance. He does not appear ill. No distress.  HENT:  Mouth/Throat: Mucous membranes are normal. Mucous membranes are not pale. No oral lesions. Posterior oropharyngeal erythema present. No oropharyngeal exudate or tonsillar abscesses.  Eyes: Left eye exhibits no discharge. No scleral icterus.  Neck: Normal range of motion. Neck supple. No JVD present. No thyromegaly present.  Cardiovascular: Normal rate, regular rhythm and normal heart sounds.  No murmur heard. Pulmonary/Chest: Effort normal and breath sounds normal. No respiratory distress. He has no wheezes. He has no rales.  Abdominal: Soft. Bowel sounds are normal. He exhibits no mass. There is no tenderness. There is no guarding.  Musculoskeletal: Normal range of motion. He exhibits no edema or tenderness.  Lymphadenopathy:    He has no cervical adenopathy.  Neurological: He is alert and oriented to person, place, and time.  Skin: Skin is warm and dry. No rash  noted. He is not diaphoretic. No erythema. No pallor.  Vitals reviewed.   Lab Results  Component Value Date   WBC 9.8 02/12/2017   HGB 13.9 02/12/2017   HCT 40.8 02/12/2017   PLT 239 02/12/2017   GLUCOSE 193 (H) 02/12/2017   CHOL 184 02/13/2016   TRIG 107.0 02/13/2016   HDL 40.40 02/13/2016   LDLDIRECT 166.7 08/24/2012   LDLCALC 123 (H) 02/13/2016   ALT 13 04/06/2016   AST 15  04/06/2016   NA 138 02/12/2017   K 3.7 02/12/2017   CL 102 02/12/2017   CREATININE 0.78 02/12/2017   BUN 22 (H) 02/12/2017   CO2 27 02/12/2017   TSH 2.21 02/13/2016   PSA 1.02 02/17/2016   HGBA1C 6.8 (H) 02/13/2016   MICROALBUR 2.5 (H) 02/13/2016   Dg Chest 2 View  Result Date: 03/15/2017 CLINICAL DATA:  Cough, shortness of breath, and headache since yesterday. History of hypertension, ulcerative colitis, diabetes, former smoker. EXAM: CHEST  2 VIEW COMPARISON:  None in PACs FINDINGS: The lungs are adequately inflated. There is no focal infiltrate. The interstitial markings are coarse bilaterally. The heart and pulmonary vascularity are normal. The mediastinum is normal in width. There is faint calcification in the wall of the aortic arch. The bony thorax is unremarkable. IMPRESSION: Mild chronic bronchitic changes.  No alveolar pneumonia nor CHF. Thoracic aortic atherosclerosis. Electronically Signed   By: David  Martinique M.D.   On: 03/15/2017 11:26    No results found.  Assessment & Plan:   Patrick Floyd was seen today for uri.  Diagnoses and all orders for this visit:  Flu-like symptoms- Testing for influenza A and B is negative. -     POC Influenza A&B (Binax test)  Cough - His chest x-ray is negative for mass or infiltrate. -     DG Chest 2 View; Future  Viral URI with cough - Antibiotic therapy is not indicated.  Will control his symptoms with Hycodan as needed. -     HYDROcodone-homatropine (HYCODAN) 5-1.5 MG/5ML syrup; Take 5 mLs by mouth every 8 (eight) hours as needed for cough.   I have discontinued Patrick Floyd. Patrick Floyd's cholecalciferol and HYDROcodone-acetaminophen. I am also having him start on HYDROcodone-homatropine. Additionally, I am having him maintain his amLODipine, metoprolol succinate, and Mesalamine.  Meds ordered this encounter  Medications  . HYDROcodone-homatropine (HYCODAN) 5-1.5 MG/5ML syrup    Sig: Take 5 mLs by mouth every 8 (eight) hours as needed for  cough.    Dispense:  120 mL    Refill:  0     Follow-up: Return in about 3 weeks (around 04/05/2017).  Scarlette Calico, MD

## 2017-05-14 ENCOUNTER — Telehealth: Payer: Self-pay | Admitting: Endocrinology

## 2017-05-18 NOTE — Telephone Encounter (Signed)
error 

## 2017-05-21 ENCOUNTER — Telehealth: Payer: Self-pay | Admitting: Endocrinology

## 2017-05-21 NOTE — Telephone Encounter (Signed)
Ov is due.  Let's address then which labs you need.

## 2017-05-21 NOTE — Telephone Encounter (Signed)
Patient is wanting to know if the doctor can put in orders for him to come in and get labs done to check his levels. Then come in and review them with the doctor.    Please advise

## 2017-05-24 NOTE — Telephone Encounter (Signed)
I called and LVM for patient to call back to make OV appt.

## 2017-05-26 ENCOUNTER — Ambulatory Visit: Payer: BLUE CROSS/BLUE SHIELD | Admitting: Endocrinology

## 2017-06-09 DIAGNOSIS — M13841 Other specified arthritis, right hand: Secondary | ICD-10-CM | POA: Diagnosis not present

## 2017-06-09 DIAGNOSIS — M79641 Pain in right hand: Secondary | ICD-10-CM | POA: Diagnosis not present

## 2017-06-10 DIAGNOSIS — M25572 Pain in left ankle and joints of left foot: Secondary | ICD-10-CM | POA: Diagnosis not present

## 2017-06-16 ENCOUNTER — Telehealth: Payer: Self-pay | Admitting: Internal Medicine

## 2017-06-16 NOTE — Telephone Encounter (Signed)
Rec'd from [facility] forwarded 3 pages to Dr. Scarlette Calico

## 2017-07-22 ENCOUNTER — Other Ambulatory Visit: Payer: Self-pay | Admitting: Cardiology

## 2017-08-06 ENCOUNTER — Other Ambulatory Visit: Payer: Self-pay | Admitting: Gastroenterology

## 2017-08-20 ENCOUNTER — Ambulatory Visit: Payer: BLUE CROSS/BLUE SHIELD | Admitting: Cardiology

## 2017-08-20 ENCOUNTER — Encounter: Payer: Self-pay | Admitting: Cardiology

## 2017-08-20 VITALS — BP 157/96 | HR 62 | Ht 73.0 in | Wt 230.1 lb

## 2017-08-20 DIAGNOSIS — E119 Type 2 diabetes mellitus without complications: Secondary | ICD-10-CM

## 2017-08-20 DIAGNOSIS — E785 Hyperlipidemia, unspecified: Secondary | ICD-10-CM

## 2017-08-20 DIAGNOSIS — I10 Essential (primary) hypertension: Secondary | ICD-10-CM

## 2017-08-20 MED ORDER — AMLODIPINE BESYLATE 10 MG PO TABS: 10.0000 mg | ORAL_TABLET | Freq: Every day | ORAL | 3 refills | Status: DC

## 2017-08-20 MED ORDER — METOPROLOL SUCCINATE ER 50 MG PO TB24: 50.0000 mg | ORAL_TABLET | Freq: Every day | ORAL | 3 refills | Status: DC

## 2017-08-20 NOTE — Progress Notes (Signed)
Cardiology Office Note    Date:  08/20/2017   ID:  Patrick Floyd, DOB 11-14-1945, MRN [id number]  PCP:  Patrick Lima, MD  Cardiologist:   Patrick Furbish, MD     History of Present Illness:  Patrick Floyd is a 72 y.o. male here for Blood pressure, fatigue, follow-up   Has a history of diabetes with hemoglobin A1c 6.8, essential hypertension with prior peripheral edema from amlodipine, hyperparathyroidism and hyperlipidemia.  We've discussed stress test and echo.   08/20/2017-has elevated blood pressure during this clinic encounter.  His home blood pressure cuff does not function well he states.  Recommended Omron.  No evidence of right bundle branch block on today's ECG.  Intermittent.  No syncope, bleeding, orthopnea, PND, chest pain, shortness of breath.  Still taking both amlodipine and metoprolol.   Past Medical History:  Diagnosis Date  . Anal fissure   . Basal cell carcinoma   . Complication of anesthesia   . Diverticulosis of colon   . GERD (gastroesophageal reflux disease)   . Headache(784.0)   . Hemorrhoids, internal   . Hyperlipidemia   . Hyperparathyroidism ([place]) 01/2016  . Hyperplastic colonic polyp   . Hypertension   . IBS (irritable bowel syndrome)   . PONV (postoperative nausea and vomiting)   . Seasonal allergies   . Ulcerative colitis     Past Surgical History:  Procedure Laterality Date  . COLONOSCOPY    . MASS EXCISION Left 02/12/2017   Procedure: EXCISION 6x6 CM BACK MASS;  Surgeon: Johnathan Hausen, MD;  Location: [facility];  Service: General;  Laterality: Left;  . none    . PARATHYROIDECTOMY      Current Medications: Outpatient Medications Prior to Visit  Medication Sig Dispense Refill  . HYDROcodone-homatropine (HYCODAN) 5-1.5 MG/5ML syrup Take 5 mLs by mouth every 8 (eight) hours as needed for cough. 120 mL 0  . Mesalamine (ASACOL) 400 MG CPDR DR capsule TAKE 4 CAPSULES (1600 MG) THREE TIMES A DAY 1080 capsule 0  . amLODipine (NORVASC)  10 MG tablet Take 1 tablet (10 mg total) by mouth daily. Please make overdue yearly appt with Dr. Marlou Porch before anymore refills. 2nd attempt 30 tablet 0  . metoprolol succinate (TOPROL-XL) 50 MG 24 hr tablet Take 1 tablet (50 mg total) by mouth daily. Take with or immediately following a meal. Please make yearly appt with Dr. Marlou Porch. 2nd attempt 15 tablet 0   No facility-administered medications prior to visit.      Allergies:   Patient has no known allergies.   Social History   Socioeconomic History  . Marital status: Married    Spouse name: Not on file  . Number of children: Not on file  . Years of education: Not on file  . Highest education level: Not on file  Occupational History  . Not on file  Social Needs  . Financial resource strain: Not on file  . Food insecurity:    Worry: Not on file    Inability: Not on file  . Transportation needs:    Medical: Not on file    Non-medical: Not on file  Tobacco Use  . Smoking status: Former Smoker    Last attempt to quit: 12/05/1979    Years since quitting: 37.7  . Smokeless tobacco: Never Used  Substance and Sexual Activity  . Alcohol use: No    Comment: rare  . Drug use: No  . Sexual activity: Not on file  Lifestyle  .  Physical activity:    Days per week: Not on file    Minutes per session: Not on file  . Stress: Not on file  Relationships  . Social connections:    Talks on phone: Not on file    Gets together: Not on file    Attends religious service: Not on file    Active member of club or organization: Not on file    Attends meetings of clubs or organizations: Not on file    Relationship status: Not on file  Other Topics Concern  . 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 Marriage is in good health. Work is usual amount of  stress.      Family History:  The patient's family history includes Breast cancer in his mother; Heart disease in his father; Hypertension in his other.   ROS:   Please see the history of present illness.    Review of Systems  All other systems reviewed and are negative.    PHYSICAL EXAM:   VS:  BP (!) 157/96   Pulse 62   Ht 6' 1"  (1.854 m)   Wt 230 lb 1.9 oz (104.4 kg)   SpO2 99%   BMI 30.36 kg/m    GEN: Well nourished, well developed, in no acute distress  HEENT: normal  Neck: no JVD, carotid bruits, or masses Cardiac: RRR; no murmurs, rubs, or gallops, mild ankle edema (amlodipine)  Respiratory:  clear to auscultation bilaterally, normal work of breathing GI: soft, nontender, nondistended, + BS MS: no deformity or atrophy  Skin: warm and dry, no rash Neuro:  Alert and Oriented x 3, Strength and sensation are intact Psych: euthymic mood, full affect   Wt Readings from Last 3 Encounters:  08/20/17 230 lb 1.9 oz (104.4 kg)  03/15/17 228 lb (103.4 kg)  02/12/17 229 lb (103.9 kg)      Studies/Labs Reviewed:   EKG:  EKG is ordered today.  08/20/2017-sinus rhythm 62 borderline LVH, nonspecific ST-T wave changes.  Sinus rhythm, right bundle branch block, left anterior fascicular block, bifascicular blockPersonally viewed  Recent Labs: 02/12/2017: BUN 22; Creatinine, Ser 0.78; Hemoglobin 13.9; Platelets 239; Potassium 3.7; Sodium 138   Lipid Panel    Component Value Date/Time   CHOL 184 02/13/2016 0734   TRIG 107.0 02/13/2016 0734   HDL 40.40 02/13/2016 0734   CHOLHDL 5 02/13/2016 0734   VLDL 21.4 02/13/2016 0734   LDLCALC 123 (H) 02/13/2016 0734   LDLDIRECT 166.7 08/24/2012 1218    Additional studies/ records that were reviewed today include:  EKG, lab work, office notes reviewed    ASSESSMENT:    1. Essential hypertension   2. Hyperlipidemia with target LDL less than 100   3. Diabetes mellitus with coincident hypertension (HCC)      PLAN:  In order of  problems listed above:  Essential hypertension  -Did not like the way he felt on by systolic.  We switched  him back previously to his old regimen of amlodipine and Toprol.Still has elevated blood pressure at this office visit.  Recommended that he get an Omron blood pressure cuff.  Next step may be to add a diuretic such as chlorthalidone.  I will defer to his primary physician Dr. Ronnald Ramp.   Right bundle-branch block/left anterior fascicular block  - Echocardiogram  with normal ejection fraction. I personally viewed echo and I do not think that he has severe LVH.  12.9 mm  - Stress test was also reassuring with no evidence of ischemia.  Good job.  -He does not have a right bundle on his ECG, intermittent.  Could be rate related.  Should be of no clinical consequence.  No symptoms of syncope.  Diabetes mellitus  - Dr. Scarlette Calico has been watching closely.  - Dietary management  - Stable, hemoglobin A1c 6.8  Hyperlipidemia   - Agree with utilization of statin especially in the setting of diabetes mellitus but he does not wish to take.  Continue to encourage statin use, LDL 123, anyone with diabetes should be on this medication for prevention.  Hyperparathyroidism status post parathyroidectomy  -Doing well post surgery.  We will see him back on as-needed basis.  Please let us know if we can be of further assistance.  Medication Adjustments/Labs and Tests Ordered: Current medicines are reviewed at length with the patient today.  Concerns regarding medicines are outlined above.  Medication changes, Labs and Tests ordered today are listed in the Patient Instructions below. Patient Instructions  Medication Instructions:  The current medical regimen is effective;  continue present plan and medications.  Follow-Up: Follow up as needed with Dr Marlou Porch.    Thank you for choosing [facility]!!     Omron blood pressure cuff    Signed, Patrick Furbish, MD  08/20/2017 8:28 AM      [facility] [place], [place], [place]  [address] Phone: [phone]; Fax: [phone]

## 2017-08-20 NOTE — Patient Instructions (Addendum)
Medication Instructions:  The current medical regimen is effective;  continue present plan and medications.  Follow-Up: Follow up as needed with Dr Marlou Porch.    Thank you for choosing [facility]!!     Omron blood pressure cuff

## 2017-12-15 NOTE — Telephone Encounter (Signed)
Given her sister's recent diagnosis, but to go ahead and repeat echocardiogram.  Diagnosis-abnormal EKG. Candee Furbish, MD

## 2017-12-16 ENCOUNTER — Telehealth: Payer: Self-pay | Admitting: *Deleted

## 2017-12-16 DIAGNOSIS — I10 Essential (primary) hypertension: Secondary | ICD-10-CM

## 2017-12-16 DIAGNOSIS — R9431 Abnormal electrocardiogram [ECG] [EKG]: Secondary | ICD-10-CM

## 2017-12-16 NOTE — Telephone Encounter (Signed)
Per Dr Marlou Porch - order 2 D Echo d/t abnormal EKG.  Replied to pt's MyChart message requeting he c/b to schedule the echo.

## 2017-12-20 NOTE — Telephone Encounter (Signed)
I personally reviewed prior study, ECHO. The Septal wall was 12.61m on my measurinments, mildly thickened but not severe . Certainly, there is no signs of obstruction.   If you would like another ECHO, I would be happy to provide you with one. Definity contrast is likely what your sister had during her study. This is needed only if images are not adequate.   MCandee Furbish MD

## 2017-12-20 NOTE — Telephone Encounter (Signed)
Order was placed for echo.  Pt was called to schedule the echo but did not answer.  A message use left for pt to c/b to schedule.  Also a message was sent via MyChart as well.

## 2017-12-22 ENCOUNTER — Encounter: Payer: Self-pay | Admitting: Cardiology

## 2017-12-29 ENCOUNTER — Ambulatory Visit: Payer: BLUE CROSS/BLUE SHIELD | Admitting: Cardiology

## 2017-12-29 ENCOUNTER — Encounter: Payer: Self-pay | Admitting: Cardiology

## 2017-12-29 ENCOUNTER — Encounter: Payer: Self-pay | Admitting: *Deleted

## 2017-12-29 VITALS — BP 170/92 | HR 69 | Ht 73.0 in | Wt 234.4 lb

## 2017-12-29 DIAGNOSIS — R9431 Abnormal electrocardiogram [ECG] [EKG]: Secondary | ICD-10-CM | POA: Diagnosis not present

## 2017-12-29 DIAGNOSIS — I10 Essential (primary) hypertension: Secondary | ICD-10-CM

## 2017-12-29 DIAGNOSIS — I4510 Unspecified right bundle-branch block: Secondary | ICD-10-CM

## 2017-12-29 MED ORDER — HYDROCHLOROTHIAZIDE 25 MG PO TABS: 25.0000 mg | ORAL_TABLET | Freq: Every day | ORAL | 3 refills | Status: DC

## 2017-12-29 NOTE — Patient Instructions (Signed)
Your physician has recommended you make the following change in your medication:  START HYDROCHLOROTHIAZIDE 25 MG [place]   Your physician has requested that you have an exercise tolerance test. For further information please visit [email or web address]. Please also follow instruction sheet, as given.    Your physician wants you to follow-up in: [place] will receive a reminder letter in the mail two months in advance. If you don't receive a letter, please call our office to schedule the follow-up appointment.

## 2017-12-29 NOTE — Progress Notes (Signed)
Cardiology Office Note:    Date:  12/29/2017   ID:  Patrick Floyd, DOB 11/19/1945, MRN [id number]  PCP:  Janith Lima, MD  Cardiologist:  No primary care provider on file.  Electrophysiologist:  None   Referring MD: Janith Lima, MD     History of Present Illness:    Patrick Floyd is a 72 y.o. male here with questions about hypertrophic cardiomyopathy given his sister's recent diagnosis here for follow-up.  We discussed this at length.  See below for details.  Overall he is not having any chest pain fevers chills nausea vomiting syncope bleeding.  Still seems to be having some trouble controlling his blood pressure.  At home sometimes he is in the 135 range however consistently seems to be quite elevated here.  This is of utmost importance to control.   Past Medical History:  Diagnosis Date  . Anal fissure   . Basal cell carcinoma   . Complication of anesthesia   . Diverticulosis of colon   . GERD (gastroesophageal reflux disease)   . Headache(784.0)   . Hemorrhoids, internal   . Hyperlipidemia   . Hyperparathyroidism ([place]) 01/2016  . Hyperplastic colonic polyp   . Hypertension   . IBS (irritable bowel syndrome)   . PONV (postoperative nausea and vomiting)   . Seasonal allergies   . Ulcerative colitis     Past Surgical History:  Procedure Laterality Date  . COLONOSCOPY    . MASS EXCISION Left 02/12/2017   Procedure: EXCISION 6x6 CM BACK MASS;  Surgeon: Johnathan Hausen, MD;  Location: [facility];  Service: General;  Laterality: Left;  . none    . PARATHYROIDECTOMY      Current Medications: Current Meds  Medication Sig  . amLODipine (NORVASC) 10 MG tablet Take 1 tablet (10 mg total) by mouth daily.  . Mesalamine (ASACOL) 400 MG CPDR DR capsule TAKE 4 CAPSULES (1600 MG) THREE TIMES A DAY  . metoprolol succinate (TOPROL-XL) 50 MG 24 hr tablet Take 1 tablet (50 mg total) by mouth daily. Take with or immediately following a meal     Allergies:   Patient has  no known allergies.   Social History   Socioeconomic History  . Marital status: Married    Spouse name: Not on file  . Number of children: Not on file  . Years of education: Not on file  . Highest education level: Not on file  Occupational History  . Not on file  Social Needs  . Financial resource strain: Not on file  . Food insecurity:    Worry: Not on file    Inability: Not on file  . Transportation needs:    Medical: Not on file    Non-medical: Not on file  Tobacco Use  . Smoking status: Former Smoker    Last attempt to quit: 12/05/1979    Years since quitting: 38.0  . Smokeless tobacco: Never Used  Substance and Sexual Activity  . Alcohol use: No    Comment: rare  . Drug use: No  . Sexual activity: Not on file  Lifestyle  . Physical activity:    Days per week: Not on file    Minutes per session: Not on file  . Stress: Not on file  Relationships  . Social connections:    Talks on phone: Not on file    Gets together: Not on file    Attends religious service: Not on file    Active member of club or  organization: Not on file    Attends meetings of clubs or organizations: Not on file    Relationship status: Not on file  Other Topics Concern  . 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 Marriage is in good health. Work is usual amount of stress.      Family History: The patient's family history includes Breast cancer in his mother; Heart disease in his father; Hypertension in his other; Hypertrophic cardiomyopathy in his sister. There is no history of Colon cancer, Stomach cancer, Cancer, or Hyperparathyroidism.  ROS:   Please see the history of present illness.     All other systems reviewed and are negative.  EKGs/Labs/Other Studies Reviewed:    The following  studies were reviewed today: Prior echocardiogram personally reviewed, lab work, EKG  EKG:  EKG is not ordered today.  Previous sinus rhythm  Recent Labs: 02/12/2017: BUN 22; Creatinine, Ser 0.78; Hemoglobin 13.9; Platelets 239; Potassium 3.7; Sodium 138  Recent Lipid Panel    Component Value Date/Time   CHOL 184 02/13/2016 0734   TRIG 107.0 02/13/2016 0734   HDL 40.40 02/13/2016 0734   CHOLHDL 5 02/13/2016 0734   VLDL 21.4 02/13/2016 0734   LDLCALC 123 (H) 02/13/2016 0734   LDLDIRECT 166.7 08/24/2012 1218    Physical Exam:    VS:  BP (!) 170/92   Pulse 69   Ht 6' 1"  (1.854 m)   Wt 234 lb 6.4 oz (106.3 kg)   SpO2 97%   BMI 30.93 kg/m     Wt Readings from Last 3 Encounters:  12/29/17 234 lb 6.4 oz (106.3 kg)  08/20/17 230 lb 1.9 oz (104.4 kg)  03/15/17 228 lb (103.4 kg)     GEN:  Well nourished, well developed in no acute distress HEENT: Normal NECK: No JVD; No carotid bruits LYMPHATICS: No lymphadenopathy CARDIAC: RRR, no murmurs, rubs, gallops RESPIRATORY:  Clear to auscultation without rales, wheezing or rhonchi  ABDOMEN: Soft, non-tender, non-distended MUSCULOSKELETAL:  No edema; No deformity  SKIN: Warm and dry NEUROLOGIC:  Alert and oriented x 3 PSYCHIATRIC:  Normal affect   ASSESSMENT:    1. Essential hypertension   2. RBBB   3. Nonspecific abnormal electrocardiogram (ECG) (EKG)    PLAN:    In order of problems listed above:  Abnormal EKG -Check exercise treadmill test.  Right bundle branch block/left anterior fascicular block - Previously normal EF.  Personally reviewed his echocardiogram once again and his wall thickness was approximately 12.9 mm.  I do not think that he has evidence of hypertrophic obstructive cardiomyopathy.  His sister was recently diagnosed with this.  The way he tells the story, she had an echocardiogram with dye.  The cardiologist interpreting this said that this was 1 of the most severe cases of hypertrophic cardiomyopathy  he had seen and sent the case to [facility] in [place] for further evaluation.  In a few days, she was set up for a heart catheterization.  After that heart catheterization she was reassured that it was not as severe.  Many of the family members relatives are being checked however.  We had lengthy discussion about this.  Risk factors such as wall thickness greater than 30, unexplained syncope, first-degree relatives with sudden death, obstructive evidence on echocardiogram, sustained arrhythmias, none of these have  been discovered in him.  I do think that checking an exercise treadmill test would be helpful for him to exclude any possible arrhythmias.  Prior nuclear stress test was Lexiscan only.  Essential hypertension - This is likely the biggest concern from a cardiovascular standpoint.  Blood pressure still remains quite elevated.  I will add HCTZ 25 mg to his regimen.  Continue to monitor this closely.  Diabetes with hypertension - He is going to see a endocrinologist.  Prior hemoglobin A1c was 6.8 but his blood sugar recently has been elevated.  He needs to exercise, lose weight.  I also agree with use of statin in the setting but he does not wish to take this.  Ultimately, I offered him a repeat echocardiogram to make sure that the wall thickness is not progressed, desires to defer this at this time.  Continue to treat hypertension.  We will see him back in 1 year.  Medication Adjustments/Labs and Tests Ordered: Current medicines are reviewed at length with the patient today.  Concerns regarding medicines are outlined above.  Orders Placed This Encounter  Procedures  . Exercise Tolerance Test   Meds ordered this encounter  Medications  . hydrochlorothiazide (HYDRODIURIL) 25 MG tablet    Sig: Take 1 tablet (25 mg total) by mouth daily.    Dispense:  90 tablet    Refill:  3    Patient Instructions  Your physician has recommended you make the following change in your medication:    START HYDROCHLOROTHIAZIDE 25 MG EVERY DAY   Your physician has requested that you have an exercise tolerance test. For further information please visit [email or web address]. Please also follow instruction sheet, as given.    Your physician wants you to follow-up in: [place] will receive a reminder letter in the mail two months in advance. If you don't receive a letter, please call our office to schedule the follow-up appointment.     Signed, Candee Furbish, MD  12/29/2017 12:49 PM    [place]

## 2018-01-07 ENCOUNTER — Ambulatory Visit: Payer: BLUE CROSS/BLUE SHIELD | Admitting: Endocrinology

## 2018-01-07 ENCOUNTER — Encounter: Payer: Self-pay | Admitting: Endocrinology

## 2018-01-07 ENCOUNTER — Ambulatory Visit (INDEPENDENT_AMBULATORY_CARE_PROVIDER_SITE_OTHER): Payer: BLUE CROSS/BLUE SHIELD

## 2018-01-07 VITALS — BP 160/88 | HR 64 | Ht 73.0 in | Wt 230.2 lb

## 2018-01-07 DIAGNOSIS — R9431 Abnormal electrocardiogram [ECG] [EKG]: Secondary | ICD-10-CM | POA: Diagnosis not present

## 2018-01-07 DIAGNOSIS — I10 Essential (primary) hypertension: Secondary | ICD-10-CM | POA: Diagnosis not present

## 2018-01-07 DIAGNOSIS — E210 Primary hyperparathyroidism: Secondary | ICD-10-CM

## 2018-01-07 DIAGNOSIS — E118 Type 2 diabetes mellitus with unspecified complications: Secondary | ICD-10-CM | POA: Diagnosis not present

## 2018-01-07 LAB — HEPATIC FUNCTION PANEL
ALT: 25 U/L (ref 0–53)
AST: 19 U/L (ref 0–37)
Albumin: 4.4 g/dL (ref 3.5–5.2)
Alkaline Phosphatase: 58 U/L (ref 39–117)
Bilirubin, Direct: 0.2 mg/dL (ref 0.0–0.3)
Total Bilirubin: 1.6 mg/dL — ABNORMAL HIGH (ref 0.2–1.2)
Total Protein: 7.4 g/dL (ref 6.0–8.3)

## 2018-01-07 LAB — POCT GLYCOSYLATED HEMOGLOBIN (HGB A1C): Hemoglobin A1C: 8.6 % — AB (ref 4.0–5.6)

## 2018-01-07 LAB — VITAMIN D 25 HYDROXY (VIT D DEFICIENCY, FRACTURES): VITD: 17.25 ng/mL — ABNORMAL LOW (ref 30.00–100.00)

## 2018-01-07 MED ORDER — GLUCOSE BLOOD VI STRP: 1.0000 | ORAL_STRIP | Freq: Every day | 12 refills | Status: DC

## 2018-01-07 MED ORDER — METFORMIN HCL ER 500 MG PO TB24: 1000.0000 mg | ORAL_TABLET | Freq: Every day | ORAL | 11 refills | Status: DC

## 2018-01-07 NOTE — Patient Instructions (Addendum)
blood tests are requested for you today.  We'll let you know about the results. I have sent a prescription to your pharmacy, for the diabetes. check your blood sugar once a day.  vary the time of day when you check, between before the 3 meals, and at bedtime.  also check if you have symptoms of your blood sugar being too high or too low.  please keep a record of the readings and bring it to your next appointment here (or you can bring the meter itself).  You can write it on any piece of paper.  please call us sooner if your blood sugar goes below 70, or if you have a lot of readings over 200.  Here is a new meter.  I have sent a prescription to your pharmacy, for strips.   Please come back for a follow-up appointment in 2 months.

## 2018-01-07 NOTE — Progress Notes (Signed)
Subjective:    Patient ID: Patrick Floyd, male    DOB: 09-23-1945, 72 y.o.   MRN: [id number]  HPI  Pt returns for f/u of hyperparathyroidism (dx'ed 2010 (it was normal in 2008); he has never had osteoporosis, urolithiasis, or bony fracture; he had resection of RUP parathyroid in 2018).  He does not take a vit-D pill. Denies cramps.  Pt asks that I address DM today. Past Medical History:  Diagnosis Date  . Anal fissure   . Basal cell carcinoma   . Complication of anesthesia   . Diverticulosis of colon   . GERD (gastroesophageal reflux disease)   . Headache(784.0)   . Hemorrhoids, internal   . Hyperlipidemia   . Hyperparathyroidism ([place]) 01/2016  . Hyperplastic colonic polyp   . Hypertension   . IBS (irritable bowel syndrome)   . PONV (postoperative nausea and vomiting)   . Seasonal allergies   . Ulcerative colitis     Past Surgical History:  Procedure Laterality Date  . COLONOSCOPY    . MASS EXCISION Left 02/12/2017   Procedure: EXCISION 6x6 CM BACK MASS;  Surgeon: Johnathan Hausen, MD;  Location: [facility];  Service: General;  Laterality: Left;  . none    . PARATHYROIDECTOMY      Social History   Socioeconomic History  . Marital status: Married    Spouse name: Not on file  . Number of children: Not on file  . Years of education: Not on file  . Highest education level: Not on file  Occupational History  . Not on file  Social Needs  . Financial resource strain: Not on file  . Food insecurity:    Worry: Not on file    Inability: Not on file  . Transportation needs:    Medical: Not on file    Non-medical: Not on file  Tobacco Use  . Smoking status: Former Smoker    Last attempt to quit: 12/05/1979    Years since quitting: 38.1  . Smokeless tobacco: Never Used  Substance and Sexual Activity  . Alcohol use: No    Comment: rare  . Drug use: No  . Sexual activity: Not on file  Lifestyle  . Physical activity:    Days per week: Not on file    Minutes per  session: Not on file  . Stress: Not on file  Relationships  . Social connections:    Talks on phone: Not on file    Gets together: Not on file    Attends religious service: Not on file    Active member of club or organization: Not on file    Attends meetings of clubs or organizations: Not on file    Relationship status: Not on file  . Intimate partner violence:    Fear of current or ex partner: Not on file    Emotionally abused: Not on file    Physically abused: Not on file    Forced sexual activity: Not on file  Other Topics Concern  . 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 Marriage is in good health. Work is usual amount of stress.     Current Outpatient Medications on File Prior to Visit  Medication Sig Dispense Refill  . amLODipine (NORVASC) 10 MG tablet Take 1 tablet (10 mg total) by mouth  daily. 90 tablet 3  . hydrochlorothiazide (HYDRODIURIL) 25 MG tablet Take 1 tablet (25 mg total) by mouth daily. 90 tablet 3  . Mesalamine (ASACOL) 400 MG CPDR DR capsule TAKE 4 CAPSULES (1600 MG) THREE TIMES A DAY 1080 capsule 0  . metoprolol succinate (TOPROL-XL) 50 MG 24 hr tablet Take 1 tablet (50 mg total) by mouth daily. Take with or immediately following a meal 90 tablet 3   No current facility-administered medications on file prior to visit.     No Known Allergies  Family History  Problem Relation Age of Onset  . Hypertension Other   . Breast cancer Mother   . Heart disease Father   . Hypertrophic cardiomyopathy Sister   . Colon cancer Neg Hx   . Stomach cancer Neg Hx   . Cancer Neg Hx        Colon and Prostate  . Hyperparathyroidism Neg Hx     BP (!) 160/88 (BP Location: Right Arm, Patient Position: Sitting, Cuff Size: Normal) Comment: Did not take metoprolol  Pulse 64    Ht 6' 1"  (1.854 m)   Wt 230 lb 3.2 oz (104.4 kg)   SpO2 95%   BMI 30.37 kg/m     Review of Systems Denies numbness    Objective:   Physical Exam VITAL SIGNS:  See vs page GENERAL: no distress Neck: a healed scar is present.  I do not appreciate a nodule in the thyroid or elsewhere in the neck. Pulses: dorsalis pedis intact bilat.   MSK: no deformity of the feet CV: 1+ bilat leg edema Skin:  no ulcer on the feet.  normal color and temp on the feet. Neuro: sensation is intact to touch on the feet.   A1c=8.6%.       Assessment & Plan:  Hyperparathyroidism: recheck today Type 2 DM: worse.  Patient Instructions  blood tests are requested for you today.  We'll let you know about the results. I have sent a prescription to your pharmacy, for the diabetes. check your blood sugar once a day.  vary the time of day when you check, between before the 3 meals, and at bedtime.  also check if you have symptoms of your blood sugar being too high or too low.  please keep a record of the readings and bring it to your next appointment here (or you can bring the meter itself).  You can write it on any piece of paper.  please call us sooner if your blood sugar goes below 70, or if you have a lot of readings over 200.  Here is a new meter.  I have sent a prescription to your pharmacy, for strips.   Please come back for a follow-up appointment in 2 months.

## 2018-01-09 LAB — PTH, INTACT AND CALCIUM
Calcium: 9.7 mg/dL (ref 8.6–10.3)
PTH: 36 pg/mL (ref 14–64)

## 2018-01-10 LAB — EXERCISE TOLERANCE TEST
Estimated workload: 7 METS
Exercise duration (min): 6 min
Exercise duration (sec): 3 s
MPHR: 151 {beats}/min
Peak HR: 134 {beats}/min
Percent HR: 88 %
RPE: 17
Rest HR: 74 {beats}/min

## 2018-01-11 ENCOUNTER — Telehealth: Payer: Self-pay

## 2018-01-11 NOTE — Telephone Encounter (Signed)
The patient has been notified of the result and verbalized understanding.  All questions (if any) were answered. Pt would like Dr. Marlou Porch to know that Dr. Loanne Drilling has started him on Glucophage due to another elevated HGBA1C.

## 2018-01-11 NOTE — Telephone Encounter (Signed)
Thank you for the update!

## 2018-01-11 NOTE — Telephone Encounter (Signed)
-----   Message from Jerline Pain, MD sent at 01/11/2018  4:21 PM EST ----- Overall reassuring ETT.  Continue to work on BP.  No adverse rhythms.  Candee Furbish, MD

## 2018-01-12 ENCOUNTER — Telehealth: Payer: Self-pay

## 2018-01-12 NOTE — Telephone Encounter (Signed)
LMTCB

## 2018-01-12 NOTE — Telephone Encounter (Signed)
New message    Just an FYI. We have made several attempts to contact this patient including sending a letter to schedule or reschedule their echocardiogram. We will be removing the patient from the echo WQ.   Thank you 

## 2018-01-13 NOTE — Telephone Encounter (Signed)
Pt called to report that he spoke with Dr. Marlou Porch at his last visit and they decided to hold off on his ECHO... Pt to call if he develops any problems or questions.

## 2018-02-09 ENCOUNTER — Other Ambulatory Visit: Payer: Self-pay | Admitting: Gastroenterology

## 2018-02-11 ENCOUNTER — Telehealth: Payer: Self-pay | Admitting: Gastroenterology

## 2018-02-11 MED ORDER — MESALAMINE 400 MG PO CPDR: DELAYED_RELEASE_CAPSULE | ORAL | 0 refills | Status: DC

## 2018-02-11 NOTE — Telephone Encounter (Signed)
Prescription sent to patient's pharmacy until scheduled appt in January.

## 2018-02-15 ENCOUNTER — Other Ambulatory Visit: Payer: Self-pay

## 2018-02-15 MED ORDER — ONETOUCH ULTRASOFT LANCETS MISC: 12 refills | Status: DC

## 2018-02-16 ENCOUNTER — Other Ambulatory Visit: Payer: Self-pay

## 2018-02-16 MED ORDER — METFORMIN HCL ER 500 MG PO TB24: 1000.0000 mg | ORAL_TABLET | Freq: Every day | ORAL | 11 refills | Status: DC

## 2018-02-18 DIAGNOSIS — E119 Type 2 diabetes mellitus without complications: Secondary | ICD-10-CM | POA: Diagnosis not present

## 2018-02-18 LAB — HM DIABETES EYE EXAM

## 2018-03-10 ENCOUNTER — Encounter: Payer: Self-pay | Admitting: Endocrinology

## 2018-03-10 ENCOUNTER — Ambulatory Visit: Payer: BLUE CROSS/BLUE SHIELD | Admitting: Endocrinology

## 2018-03-10 VITALS — BP 132/86 | HR 72 | Ht 73.0 in | Wt 228.4 lb

## 2018-03-10 DIAGNOSIS — R609 Edema, unspecified: Secondary | ICD-10-CM | POA: Diagnosis not present

## 2018-03-10 DIAGNOSIS — E118 Type 2 diabetes mellitus with unspecified complications: Secondary | ICD-10-CM | POA: Diagnosis not present

## 2018-03-10 LAB — POCT GLYCOSYLATED HEMOGLOBIN (HGB A1C): Hemoglobin A1C: 7 % — AB (ref 4.0–5.6)

## 2018-03-10 MED ORDER — GLUCOSE BLOOD VI STRP: 1.0000 | ORAL_STRIP | Freq: Every day | 12 refills | Status: DC

## 2018-03-10 NOTE — Progress Notes (Signed)
Subjective:    Patient ID: Patrick Floyd, male    DOB: Aug 15, 1945, 73 y.o.   MRN: [id number]  HPI Pt has secondary hyperparathyroidism (dx'ed 2010 (it was normal in 2008); he has never had osteoporosis, urolithiasis, or bony fracture; he had resection of RUP parathyroid in 2018).   Pt also returns for f/u of diabetes mellitus:  DM type: 2 Dx'ed: 0355 Complications: none Therapy: metformin DKA: never Severe hypoglycemia: never Pancreatitis: never Pancreatic imaging: normal in 2014 CT Other: edema limits rx options; he has never been on insulin Interval history: Meter is downloaded today, and the printout is scanned into the record.  cbg varies from 107-208.  There is no trend throughout the day.  Past Medical History:  Diagnosis Date  . Anal fissure   . Basal cell carcinoma   . Complication of anesthesia   . Diverticulosis of colon   . GERD (gastroesophageal reflux disease)   . Headache(784.0)   . Hemorrhoids, internal   . Hyperlipidemia   . Hyperparathyroidism ([place]) 01/2016  . Hyperplastic colonic polyp   . Hypertension   . IBS (irritable bowel syndrome)   . PONV (postoperative nausea and vomiting)   . Seasonal allergies   . Ulcerative colitis     Past Surgical History:  Procedure Laterality Date  . COLONOSCOPY    . MASS EXCISION Left 02/12/2017   Procedure: EXCISION 6x6 CM BACK MASS;  Surgeon: Johnathan Hausen, MD;  Location: [facility];  Service: General;  Laterality: Left;  . none    . PARATHYROIDECTOMY      Social History   Socioeconomic History  . Marital status: Married    Spouse name: Not on file  . Number of children: Not on file  . Years of education: Not on file  . Highest education level: Not on file  Occupational History  . Not on file  Social Needs  . Financial resource strain: Not on file  . Food insecurity:    Worry: Not on file    Inability: Not on file  . Transportation needs:    Medical: Not on file    Non-medical: Not on file  Tobacco  Use  . Smoking status: Former Smoker    Last attempt to quit: 12/05/1979    Years since quitting: 38.2  . Smokeless tobacco: Never Used  Substance and Sexual Activity  . Alcohol use: No    Comment: rare  . Drug use: No  . Sexual activity: Not on file  Lifestyle  . Physical activity:    Days per week: Not on file    Minutes per session: Not on file  . Stress: Not on file  Relationships  . Social connections:    Talks on phone: Not on file    Gets together: Not on file    Attends religious service: Not on file    Active member of club or organization: Not on file    Attends meetings of clubs or organizations: Not on file    Relationship status: Not on file  . Intimate partner violence:    Fear of current or ex partner: Not on file    Emotionally abused: Not on file    Physically abused: Not on file    Forced sexual activity: Not on file  Other Topics Concern  . 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 Marriage is in good health. Work is usual amount of stress.     Current Outpatient Medications on File Prior to Visit  Medication Sig Dispense Refill  . amLODipine (NORVASC) 10 MG tablet Take 1 tablet (10 mg total) by mouth daily. 90 tablet 3  . Lancets (ONETOUCH ULTRASOFT) lancets Use as instructed 100 each 12  . Mesalamine (ASACOL) 400 MG CPDR DR capsule TAKE 4 CAPSULES (1600 MG) THREE TIMES A DAY 1080 capsule 0  . metFORMIN (GLUCOPHAGE-XR) 500 MG 24 hr tablet Take 2 tablets (1,000 mg total) by mouth daily with breakfast. Take 2 tablets (1,000 mg total) by mouth with breakfast; E11.8 60 tablet 11  . metoprolol succinate (TOPROL-XL) 50 MG 24 hr tablet Take 1 tablet (50 mg total) by mouth daily. Take with or immediately following a meal 90 tablet 3   No current facility-administered  medications on file prior to visit.     No Known Allergies  Family History  Problem Relation Age of Onset  . Hypertension Other   . Breast cancer Mother   . Heart disease Father   . Hypertrophic cardiomyopathy Sister   . Colon cancer Neg Hx   . Stomach cancer Neg Hx   . Cancer Neg Hx        Colon and Prostate  . Hyperparathyroidism Neg Hx     BP 132/86 (BP Location: Right Arm, Patient Position: Sitting, Cuff Size: Normal)   Pulse 72   Ht 6' 1"  (1.854 m)   Wt 228 lb 6.4 oz (103.6 kg)   SpO2 95%   BMI 30.13 kg/m    Review of Systems Denies cramps    Objective:   Physical Exam VITAL SIGNS:  See vs page GENERAL: no distress Pulses: dorsalis pedis intact bilat.   MSK: no deformity of the feet CV: 1+ bilat leg edema Skin:  no ulcer on the feet.  normal color and temp on the feet. Neuro: sensation is intact to touch on the feet  Lab Results  Component Value Date   HGBA1C 7.0 (A) 03/10/2018    Lab Results  Component Value Date   CREATININE 0.78 02/12/2017   BUN 22 (H) 02/12/2017   NA 138 02/12/2017   K 3.7 02/12/2017   CL 102 02/12/2017   CO2 27 02/12/2017       Assessment & Plan:  Type 2 DM: he needs increased rx, if it can be done with a regimen that avoids or minimizes hypoglycemia.  We discussed.  He declines to add another med.   Edema: this limits rx options.  Patient Instructions  Please continue the same medications.    check your blood sugar once a day.  vary the time of day when you check, between before the 3 meals, and at bedtime.  also check if you have symptoms of your blood sugar being too high or too low.  please keep a record of the readings and bring it to your next appointment here (or you can bring the meter itself).  You can write it on any piece of paper.  please call us sooner if your blood sugar goes below 70, or if you have a lot of readings over 200.  Please come back for a follow-up appointment in 3 months.

## 2018-03-10 NOTE — Patient Instructions (Addendum)
Please continue the same medications.    check your blood sugar once a day.  vary the time of day when you check, between before the 3 meals, and at bedtime.  also check if you have symptoms of your blood sugar being too high or too low.  please keep a record of the readings and bring it to your next appointment here (or you can bring the meter itself).  You can write it on any piece of paper.  please call us sooner if your blood sugar goes below 70, or if you have a lot of readings over 200.  Please come back for a follow-up appointment in 3 months.

## 2018-03-18 ENCOUNTER — Encounter: Payer: Self-pay | Admitting: Gastroenterology

## 2018-03-18 ENCOUNTER — Ambulatory Visit: Payer: BLUE CROSS/BLUE SHIELD | Admitting: Gastroenterology

## 2018-03-18 VITALS — BP 138/80 | HR 65 | Ht 73.0 in | Wt 229.2 lb

## 2018-03-18 DIAGNOSIS — K5150 Left sided colitis without complications: Secondary | ICD-10-CM | POA: Diagnosis not present

## 2018-03-18 DIAGNOSIS — Z86010 Personal history of colonic polyps: Secondary | ICD-10-CM

## 2018-03-18 MED ORDER — MESALAMINE 400 MG PO CPDR: DELAYED_RELEASE_CAPSULE | ORAL | 3 refills | Status: DC

## 2018-03-18 NOTE — Progress Notes (Signed)
    History of Present Illness: This is a 73 year old male with left-sided ulcerative colitis.  He has no gastrointestinal complaints.  He states he was diagnosed with diabetes 2 months ago and started on metformin.  His hemoglobin A1c has improved.  He underwent parathyroidectomy in 2018. Denies weight loss, abdominal pain, constipation, diarrhea, change in stool caliber, melena, hematochezia, nausea, vomiting, dysphagia, reflux symptoms, chest pain.  Current Medications, Allergies, Past Medical History, Past Surgical History, Family History and Social History were reviewed in [employer] record.  Physical Exam: General: Well developed, well nourished, no acute distress Head: Normocephalic and atraumatic Eyes:  sclerae anicteric, EOMI Ears: Normal auditory acuity Mouth: No deformity or lesions Lungs: Clear throughout to auscultation Heart: Regular rate and rhythm; no murmurs, rubs or bruits Abdomen: Soft, non tender and non distended. No masses, hepatosplenomegaly or hernias noted. Normal Bowel sounds Rectal: Not done Musculoskeletal: Symmetrical with no gross deformities  Pulses:  Normal pulses noted Extremities: No clubbing, cyanosis, edema or deformities noted Neurological: Alert oriented x 4, grossly nonfocal Psychological:  Alert and cooperative. Normal mood and affect   Assessment and Recommendations:  1.  Left-sided ulcerative colitis.  Currently asymptomatic.  Continue mesalamine 1.6 g 3 times daily.  Surveillance colonoscopy recommended in October 2020.  2.  Personal history of adenomatous colon polyps.  Surveillance colonoscopy recommended in October 2020.  3.  Recently diagnosed diabetes mellitus.

## 2018-03-18 NOTE — Patient Instructions (Signed)
We have sent the following prescriptions to your mail in pharmacy: [facility]   If you have not heard from your mail in pharmacy within 1 week or if you have not received your medication in the mail, please contact us at [phone] so we may find out why.  You will be due for a recall colonoscopy in 12/2018. We will send you a reminder in the mail when it gets closer to that time.  Normal BMI (Body Mass Index- based on height and weight) is between 23 and 30. Your BMI today is Body mass index is 30.24 kg/m. Patrick Floyd Please consider follow up  regarding your BMI with your Primary Care Provider.  Thank you for choosing me and [facility].  Pricilla Riffle. Dagoberto Ligas., MD., [facility] Regal

## 2018-03-24 ENCOUNTER — Encounter: Payer: Self-pay | Admitting: Internal Medicine

## 2018-03-24 NOTE — Progress Notes (Signed)
Abstracted and sent to scan  

## 2018-04-01 ENCOUNTER — Telehealth: Payer: Self-pay | Admitting: Endocrinology

## 2018-04-01 MED ORDER — METFORMIN HCL ER 500 MG PO TB24: 1000.0000 mg | ORAL_TABLET | Freq: Every day | ORAL | 11 refills | Status: DC

## 2018-04-01 MED ORDER — GLUCOSE BLOOD VI STRP: 1.0000 | ORAL_STRIP | Freq: Every day | 12 refills | Status: DC

## 2018-04-01 MED ORDER — ONETOUCH ULTRASOFT LANCETS MISC: 12 refills | Status: DC

## 2018-04-01 NOTE — Telephone Encounter (Signed)
RXs sent.

## 2018-04-01 NOTE — Telephone Encounter (Signed)
Lancets (ONETOUCH ULTRASOFT) lancets  glucose blood (ONETOUCH VERIO) test strip   metFORMIN (GLUCOPHAGE-XR) 500 MG 24 hr tablet   Patient would like the above medications to be sent into the [facility], he would also like those prescription to be 90 days supply      [place], [place]

## 2018-04-12 ENCOUNTER — Telehealth: Payer: Self-pay | Admitting: Endocrinology

## 2018-04-12 ENCOUNTER — Other Ambulatory Visit: Payer: Self-pay

## 2018-04-12 DIAGNOSIS — E118 Type 2 diabetes mellitus with unspecified complications: Secondary | ICD-10-CM

## 2018-04-12 MED ORDER — ONETOUCH DELICA LANCETS 33G MISC: 1.0000 | Freq: Every day | 11 refills | Status: DC

## 2018-04-12 NOTE — Telephone Encounter (Signed)
ONETOUCH DELICA LANCETS 48G MISC 100 each 11 04/12/2018    Sig - Route: 1 each by Does not apply route daily. - Does not apply   Sent to pharmacy as: Jonetta Speak LANCETS 89V Misc   E-Prescribing Status: Receipt confirmed by pharmacy (04/12/2018 4:18 PM EST)

## 2018-04-12 NOTE — Telephone Encounter (Signed)
Patient stated that we are sending the refills for the Lancets but they are the incorrect ones. He stated he received the correct one at the visit with Dr Loanne Drilling which were the [place] plus 100  The ones he received at the pharmacy are the One touch ultra soft That do not fit.  He is needing the correct ones sent in or a new machine so the One touch will work.     [place], Patrick Floyd

## 2018-06-15 ENCOUNTER — Ambulatory Visit: Payer: BLUE CROSS/BLUE SHIELD | Admitting: Endocrinology

## 2018-06-16 ENCOUNTER — Ambulatory Visit: Payer: BLUE CROSS/BLUE SHIELD | Admitting: Endocrinology

## 2018-06-22 ENCOUNTER — Encounter: Payer: Self-pay | Admitting: General Practice

## 2018-06-23 ENCOUNTER — Ambulatory Visit (INDEPENDENT_AMBULATORY_CARE_PROVIDER_SITE_OTHER): Payer: BLUE CROSS/BLUE SHIELD | Admitting: Endocrinology

## 2018-06-23 ENCOUNTER — Other Ambulatory Visit: Payer: Self-pay

## 2018-06-23 DIAGNOSIS — E118 Type 2 diabetes mellitus with unspecified complications: Secondary | ICD-10-CM

## 2018-06-23 NOTE — Progress Notes (Signed)
Subjective:    Patient ID: Patrick Floyd, male    DOB: 1945-03-30, 73 y.o.   MRN: [id number]  HPI  telehealth visit today via doxy video visit.  Alternatives to telehealth are presented to this patient, and the patient agrees to the telehealth visit. Pt is advised of the cost of the visit, and agrees to this, also.   Patient is at home, and I am at the office.   Pt has secondary hyperparathyroidism (dx'ed 2010 (it was normal in 2008); he has never had osteoporosis, urolithiasis, or bony fracture; he had resection of RUP parathyroid in 2018).   Pt also returns for f/u of diabetes mellitus:  DM type: 2 Dx'ed: 6720 Complications: none.  Therapy: metformin DKA: never Severe hypoglycemia: never Pancreatitis: never Pancreatic imaging: normal in 2014 CT Other: edema limits rx options; he has never been on insulin.   Interval history: pt states cbg vary from 130-180.  There is no trend throughout the day.   Past Medical History:  Diagnosis Date  . Anal fissure   . Basal cell carcinoma   . Complication of anesthesia   . Diverticulosis of colon   . GERD (gastroesophageal reflux disease)   . Headache(784.0)   . Hemorrhoids, internal   . Hyperlipidemia   . Hyperparathyroidism ([place]) 01/2016  . Hyperplastic colonic polyp   . Hypertension   . IBS (irritable bowel syndrome)   . PONV (postoperative nausea and vomiting)   . Seasonal allergies   . Ulcerative colitis     Past Surgical History:  Procedure Laterality Date  . COLONOSCOPY    . MASS EXCISION Left 02/12/2017   Procedure: EXCISION 6x6 CM BACK MASS;  Surgeon: Johnathan Hausen, MD;  Location: [facility];  Service: General;  Laterality: Left;  . none    . PARATHYROIDECTOMY      Social History   Socioeconomic History  . Marital status: Married    Spouse name: Not on file  . Number of children: Not on file  . Years of education: Not on file  . Highest education level: Not on file  Occupational History  . Not on file   Social Needs  . Financial resource strain: Not on file  . Food insecurity:    Worry: Not on file    Inability: Not on file  . Transportation needs:    Medical: Not on file    Non-medical: Not on file  Tobacco Use  . Smoking status: Former Smoker    Last attempt to quit: 12/05/1979    Years since quitting: 38.5  . Smokeless tobacco: Never Used  Substance and Sexual Activity  . Alcohol use: No    Comment: rare  . Drug use: No  . Sexual activity: Not on file  Lifestyle  . Physical activity:    Days per week: Not on file    Minutes per session: Not on file  . Stress: Not on file  Relationships  . Social connections:    Talks on phone: Not on file    Gets together: Not on file    Attends religious service: Not on file    Active member of club or organization: Not on file    Attends meetings of clubs or organizations: Not on file    Relationship status: Not on file  . Intimate partner violence:    Fear of current or ex partner: Not on file    Emotionally abused: Not on file    Physically abused: Not on file  Forced sexual activity: Not on file  Other Topics Concern  . 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 Marriage is in good health. Work is usual amount of stress.     Current Outpatient Medications on File Prior to Visit  Medication Sig Dispense Refill  . amLODipine (NORVASC) 10 MG tablet Take 1 tablet (10 mg total) by mouth daily. 90 tablet 3  . glucose blood (ONETOUCH VERIO) test strip 1 each by Other route daily. And lancets 1/day 100 each 12  . Mesalamine (ASACOL) 400 MG CPDR DR capsule TAKE 4 CAPSULES (1600 MG) THREE TIMES A DAY 1080 capsule 3  . metFORMIN (GLUCOPHAGE-XR) 500 MG 24 hr tablet Take 2 tablets (1,000 mg total) by mouth daily with breakfast. Take 2 tablets  (1,000 mg total) by mouth with breakfast; E11.8 180 tablet 11  . metoprolol succinate (TOPROL-XL) 50 MG 24 hr tablet Take 1 tablet (50 mg total) by mouth daily. Take with or immediately following a meal 90 tablet 3  . ONETOUCH DELICA LANCETS 01B MISC 1 each by Does not apply route daily. 100 each 11   No current facility-administered medications on file prior to visit.     No Known Allergies  Family History  Problem Relation Age of Onset  . Hypertension Other   . Breast cancer Mother   . Heart disease Father   . Hypertrophic cardiomyopathy Sister   . Colon cancer Neg Hx   . Stomach cancer Neg Hx   . Cancer Neg Hx        Colon and Prostate  . Hyperparathyroidism Neg Hx      Review of Systems He has lost a few lbs, due to his efforts.      Objective:   Physical Exam      Assessment & Plan:  Type 2 DM: apparently well-controlled.  he declines a1c now Edema: This limits rx options.   Patient Instructions  Please continue the same medications.    check your blood sugar once a day.  vary the time of day when you check, between before the 3 meals, and at bedtime.  also check if you have symptoms of your blood sugar being too high or too low.  please keep a record of the readings and bring it to your next appointment here (or you can bring the meter itself).  You can write it on any piece of paper.  please call us sooner if your blood sugar goes below 70, or if you have a lot of readings over 200.  Please come back for a follow-up appointment in 4-6 weeks.

## 2018-06-23 NOTE — Patient Instructions (Addendum)
Please continue the same medications.    check your blood sugar once a day.  vary the time of day when you check, between before the 3 meals, and at bedtime.  also check if you have symptoms of your blood sugar being too high or too low.  please keep a record of the readings and bring it to your next appointment here (or you can bring the meter itself).  You can write it on any piece of paper.  please call us sooner if your blood sugar goes below 70, or if you have a lot of readings over 200.  Please come back for a follow-up appointment in 4-6 weeks.

## 2018-07-18 ENCOUNTER — Other Ambulatory Visit: Payer: Self-pay | Admitting: Cardiology

## 2018-08-03 ENCOUNTER — Encounter: Payer: Self-pay | Admitting: Endocrinology

## 2018-08-03 ENCOUNTER — Ambulatory Visit: Payer: BLUE CROSS/BLUE SHIELD | Admitting: Endocrinology

## 2018-09-14 ENCOUNTER — Other Ambulatory Visit: Payer: Self-pay

## 2018-09-16 ENCOUNTER — Ambulatory Visit: Payer: BLUE CROSS/BLUE SHIELD | Admitting: Endocrinology

## 2018-09-19 ENCOUNTER — Other Ambulatory Visit: Payer: Self-pay | Admitting: Internal Medicine

## 2018-09-19 DIAGNOSIS — Z20822 Contact with and (suspected) exposure to covid-19: Secondary | ICD-10-CM

## 2018-09-22 ENCOUNTER — Encounter: Payer: Self-pay | Admitting: Internal Medicine

## 2018-09-22 LAB — NOVEL CORONAVIRUS, NAA: SARS-CoV-2, NAA: NOT DETECTED

## 2018-09-22 LAB — SPECIMEN STATUS REPORT

## 2018-09-23 ENCOUNTER — Ambulatory Visit: Payer: BC Managed Care – PPO | Admitting: Endocrinology

## 2018-09-26 ENCOUNTER — Other Ambulatory Visit: Payer: Self-pay

## 2018-09-28 ENCOUNTER — Encounter: Payer: Self-pay | Admitting: Endocrinology

## 2018-09-28 ENCOUNTER — Ambulatory Visit: Payer: BC Managed Care – PPO | Admitting: Endocrinology

## 2018-09-28 ENCOUNTER — Other Ambulatory Visit: Payer: Self-pay

## 2018-09-28 VITALS — BP 136/70 | HR 68 | Ht 73.0 in | Wt 223.0 lb

## 2018-09-28 DIAGNOSIS — E118 Type 2 diabetes mellitus with unspecified complications: Secondary | ICD-10-CM

## 2018-09-28 DIAGNOSIS — E213 Hyperparathyroidism, unspecified: Secondary | ICD-10-CM

## 2018-09-28 LAB — LIPID PANEL
Cholesterol: 196 mg/dL (ref 0–200)
HDL: 41.6 mg/dL (ref >39.00)
LDL Cholesterol: 136 mg/dL — ABNORMAL HIGH (ref 0–99)
NonHDL: 154.19
Total CHOL/HDL Ratio: 5
Triglycerides: 89 mg/dL (ref 0.0–149.0)
VLDL: 17.8 mg/dL (ref 0.0–40.0)

## 2018-09-28 LAB — BASIC METABOLIC PANEL
BUN: 22 mg/dL (ref 6–23)
CO2: 32 mEq/L (ref 19–32)
Calcium: 9.9 mg/dL (ref 8.4–10.5)
Chloride: 101 mEq/L (ref 96–112)
Creatinine, Ser: 0.9 mg/dL (ref 0.40–1.50)
GFR: 83.27 mL/min (ref >60.00)
Glucose, Bld: 150 mg/dL — ABNORMAL HIGH (ref 70–99)
Potassium: 4 mEq/L (ref 3.5–5.1)
Sodium: 139 mEq/L (ref 135–145)

## 2018-09-28 LAB — TSH: TSH: 2.05 u[IU]/mL (ref 0.35–4.50)

## 2018-09-28 LAB — POCT GLYCOSYLATED HEMOGLOBIN (HGB A1C): Hemoglobin A1C: 6.9 % — AB (ref 4.0–5.6)

## 2018-09-28 LAB — VITAMIN D 25 HYDROXY (VIT D DEFICIENCY, FRACTURES): VITD: 18.3 ng/mL — ABNORMAL LOW (ref 30.00–100.00)

## 2018-09-28 MED ORDER — SITAGLIPTIN PHOSPHATE 50 MG PO TABS: 50.0000 mg | ORAL_TABLET | Freq: Every day | ORAL | 3 refills | Status: DC

## 2018-09-28 NOTE — Progress Notes (Signed)
Subjective:    Patient ID: Patrick Floyd, male    DOB: January 10, 1946, 73 y.o.   MRN: [id number]  HPI Pt has secondary hyperparathyroidism (dx'ed 2010 (it was normal in 2008); he has never had osteoporosis, urolithiasis, or bony fracture; he had resection of RUP parathyroid in 2018).   Pt also returns for f/u of diabetes mellitus:  DM type: 2 Dx'ed: 8119 Complications: none.  Therapy: metformin DKA: never Severe hypoglycemia: never Pancreatitis: never Pancreatic imaging: normal in 2014 CT Other: edema limits rx options; he has never been on insulin.   Interval history: pt states he feels well in general.  He takes meds as rx'ed.  He does not take vitamin-D.  Past Medical History:  Diagnosis Date  . Anal fissure   . Basal cell carcinoma   . Complication of anesthesia   . Diverticulosis of colon   . GERD (gastroesophageal reflux disease)   . Headache(784.0)   . Hemorrhoids, internal   . Hyperlipidemia   . Hyperparathyroidism ([place]) 01/2016  . Hyperplastic colonic polyp   . Hypertension   . IBS (irritable bowel syndrome)   . PONV (postoperative nausea and vomiting)   . Seasonal allergies   . Ulcerative colitis     Past Surgical History:  Procedure Laterality Date  . COLONOSCOPY    . MASS EXCISION Left 02/12/2017   Procedure: EXCISION 6x6 CM BACK MASS;  Surgeon: Johnathan Hausen, MD;  Location: [facility];  Service: General;  Laterality: Left;  . none    . PARATHYROIDECTOMY      Social History   Socioeconomic History  . Marital status: Married    Spouse name: Not on file  . Number of children: Not on file  . Years of education: Not on file  . Highest education level: Not on file  Occupational History  . Not on file  Social Needs  . Financial resource strain: Not on file  . Food insecurity    Worry: Not on file    Inability: Not on file  . Transportation needs    Medical: Not on file    Non-medical: Not on file  Tobacco Use  . Smoking status: Former Smoker   Quit date: 12/05/1979    Years since quitting: 38.8  . Smokeless tobacco: Never Used  Substance and Sexual Activity  . Alcohol use: No    Comment: rare  . Drug use: No  . Sexual activity: Not on file  Lifestyle  . Physical activity    Days per week: Not on file    Minutes per session: Not on file  . Stress: Not on file  Relationships  . Social Herbalist on phone: Not on file    Gets together: Not on file    Attends religious service: Not on file    Active member of club or organization: Not on file    Attends meetings of clubs or organizations: Not on file    Relationship status: Not on file  . Intimate partner violence    Fear of current or ex partner: Not on file    Emotionally abused: Not on file    Physically abused: Not on file    Forced sexual activity: Not on file  Other Topics Concern  . 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 Marriage is in good health. Work is usual amount of stress.     Current Outpatient Medications on File Prior to Visit  Medication Sig Dispense Refill  . amLODipine (NORVASC) 10 MG tablet TAKE 1 TABLET DAILY 90 tablet 1  . glucose blood (ONETOUCH VERIO) test strip 1 each by Other route daily. And lancets 1/day 100 each 12  . Mesalamine (ASACOL) 400 MG CPDR DR capsule TAKE 4 CAPSULES (1600 MG) THREE TIMES A DAY 1080 capsule 3  . metFORMIN (GLUCOPHAGE-XR) 500 MG 24 hr tablet Take 2 tablets (1,000 mg total) by mouth daily with breakfast. Take 2 tablets (1,000 mg total) by mouth with breakfast; E11.8 180 tablet 11  . metoprolol succinate (TOPROL-XL) 50 MG 24 hr tablet TAKE 1 TABLET DAILY WITH OR IMMEDIATELY FOLLOWING A MEAL 90 tablet 1  . ONETOUCH DELICA LANCETS 01B MISC 1 each by Does not apply route daily. 100 each 11   No current  facility-administered medications on file prior to visit.     No Known Allergies  Family History  Problem Relation Age of Onset  . Hypertension Other   . Breast cancer Mother   . Heart disease Father   . Hypertrophic cardiomyopathy Sister   . Colon cancer Neg Hx   . Stomach cancer Neg Hx   . Cancer Neg Hx        Colon and Prostate  . Hyperparathyroidism Neg Hx     BP 136/70 (BP Location: Left Arm, Patient Position: Sitting, Cuff Size: Normal)   Pulse 68   Ht 6' 1"  (1.854 m)   Wt 223 lb (101.2 kg)   SpO2 95%   BMI 29.42 kg/m   Review of Systems Denies sob    Objective:   Physical Exam VITAL SIGNS:  See vs page GENERAL: no distress Pulses: dorsalis pedis intact bilat.   MSK: no deformity of the feet CV: 1+ bilat leg edema, and hyperpigmentation of the legs. Skin:  no ulcer on the feet.  normal color and temp on the feet.   Neuro: sensation is intact to touch on the feet.     Lab Results  Component Value Date   HGBA1C 6.9 (A) 09/28/2018   Lab Results  Component Value Date   CREATININE 0.78 02/12/2017   BUN 22 (H) 02/12/2017   NA 138 02/12/2017   K 3.7 02/12/2017   CL 102 02/12/2017   CO2 27 02/12/2017       Assessment & Plan:  Type 2 DM: he needs increased rx, if it can be done with a regimen that avoids or minimizes hypoglycemia.  We discussed rx options.  Edema: SGLT might help this, but he declines Vit-D def: recheck today  Patient Instructions  I have sent a prescription to your pharmacy, to add "Januvia." Please continue the same metformin. check your blood sugar once a day.  vary the time of day when you check, between before the 3 meals, and at bedtime.  also check if you have symptoms of your blood sugar being too high or too low.  please keep a record of the readings and bring it to your next appointment here (or you can bring the meter itself).  You can write it on any piece of paper.  please call us sooner if your blood sugar goes below 70, or if  you have a lot of readings over 200.  Please come back for a follow-up appointment in 4-6 months.

## 2018-09-28 NOTE — Patient Instructions (Addendum)
I have sent a prescription to your pharmacy, to add "Januvia." Please continue the same metformin. check your blood sugar once a day.  vary the time of day when you check, between before the 3 meals, and at bedtime.  also check if you have symptoms of your blood sugar being too high or too low.  please keep a record of the readings and bring it to your next appointment here (or you can bring the meter itself).  You can write it on any piece of paper.  please call us sooner if your blood sugar goes below 70, or if you have a lot of readings over 200.  Please come back for a follow-up appointment in 4-6 months.

## 2018-09-29 LAB — PTH, INTACT AND CALCIUM
Calcium: 9.7 mg/dL (ref 8.6–10.3)
PTH: 25 pg/mL (ref 14–64)

## 2018-10-29 IMAGING — NM NM MISC PROCEDURE
3 series · 18 of 18 positions shown · non-contrast
Comparison: none

[Series 1: rest_(id)_sa · 6.4mm · 6.40mm/px · 6 of 64 frames shown]
[frame 6/64]
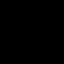
[frame 16/64]
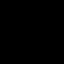
[frame 27/64]
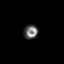
[frame 38/64]
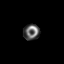
[frame 48/64]
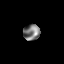
[frame 59/64]
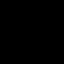

[Series 1: stress-sum-em_(id)_sa · 6.4mm · 6.40mm/px · 6 of 64 frames shown]
[frame 6/64]
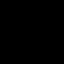
[frame 16/64]
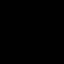
[frame 27/64]
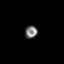
[frame 38/64]
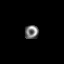
[frame 48/64]
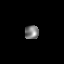
[frame 59/64]
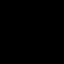

[Series 1: stress-gsp_(id)_sa · 6.4mm · 6.40mm/px · 6 of 512 frames shown]
[frame 43/512]
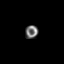
[frame 128/512]
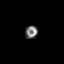
[frame 214/512]
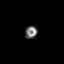
[frame 299/512]
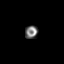
[frame 384/512]
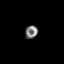
[frame 470/512]
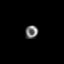

[18 of 18 positions shown; findings below may reference images not displayed]

Canned report from images found in remote index.

Refer to host system for actual result text.

## 2018-12-07 ENCOUNTER — Encounter: Payer: Self-pay | Admitting: Gastroenterology

## 2018-12-16 ENCOUNTER — Encounter: Payer: Self-pay | Admitting: Gastroenterology

## 2018-12-19 ENCOUNTER — Other Ambulatory Visit: Payer: Self-pay

## 2018-12-19 ENCOUNTER — Ambulatory Visit (INDEPENDENT_AMBULATORY_CARE_PROVIDER_SITE_OTHER): Payer: BC Managed Care – PPO

## 2018-12-19 DIAGNOSIS — Z23 Encounter for immunization: Secondary | ICD-10-CM | POA: Diagnosis not present

## 2018-12-29 ENCOUNTER — Other Ambulatory Visit: Payer: Self-pay | Admitting: Cardiology

## 2019-01-02 ENCOUNTER — Other Ambulatory Visit: Payer: Self-pay | Admitting: Cardiology

## 2019-01-02 MED ORDER — METOPROLOL SUCCINATE ER 50 MG PO TB24: ORAL_TABLET | ORAL | 0 refills | Status: DC

## 2019-01-02 MED ORDER — AMLODIPINE BESYLATE 10 MG PO TABS: 10.0000 mg | ORAL_TABLET | Freq: Every day | ORAL | 0 refills | Status: DC

## 2019-01-02 NOTE — Telephone Encounter (Signed)
Pt's medications were sent to pt's pharmacy as requested. Confirmation received.  

## 2019-01-12 ENCOUNTER — Other Ambulatory Visit: Payer: Self-pay

## 2019-01-12 ENCOUNTER — Ambulatory Visit: Payer: BC Managed Care – PPO | Admitting: Cardiology

## 2019-01-12 ENCOUNTER — Encounter: Payer: Self-pay | Admitting: Cardiology

## 2019-01-12 VITALS — BP 160/80 | HR 65 | Ht 73.0 in | Wt 222.0 lb

## 2019-01-12 DIAGNOSIS — E119 Type 2 diabetes mellitus without complications: Secondary | ICD-10-CM

## 2019-01-12 DIAGNOSIS — I4510 Unspecified right bundle-branch block: Secondary | ICD-10-CM | POA: Diagnosis not present

## 2019-01-12 DIAGNOSIS — E785 Hyperlipidemia, unspecified: Secondary | ICD-10-CM

## 2019-01-12 DIAGNOSIS — R9431 Abnormal electrocardiogram [ECG] [EKG]: Secondary | ICD-10-CM

## 2019-01-12 DIAGNOSIS — I10 Essential (primary) hypertension: Secondary | ICD-10-CM | POA: Diagnosis not present

## 2019-01-12 NOTE — Progress Notes (Signed)
Cardiology Office Note:    Date:  01/12/2019   ID:  Patrick Floyd, DOB 1945-05-22, MRN [id number]  PCP:  Janith Lima, MD  Cardiologist:  Candee Furbish, MD  Electrophysiologist:  None   Referring MD: Janith Lima, MD     History of Present Illness:    Patrick Floyd is a 73 y.o. male here with questions about hypertrophic cardiomyopathy given his sister's recent diagnosis here for follow-up.  We discussed this at length.  See below for details.  Overall he is not having any chest pain fevers chills nausea vomiting syncope bleeding.  Still seems to be having some trouble controlling his blood pressure.  At home sometimes he is in the 135 range however consistently seems to be quite elevated here.  This is of utmost importance to control.  01/12/2019-here for the follow-up of bifascicular block.  He is doing well without any syncopal symptoms no chest pain fevers chills nausea vomiting.  We discussed once again the benefits of angiotensin receptor blocker or ACE inhibitor in the setting of diabetes.  Felt poor on these medications he states.  Blood pressure today still a little bit elevated.  He has lost some weight.  Continue to monitor.  Past Medical History:  Diagnosis Date  . Anal fissure   . Basal cell carcinoma   . Complication of anesthesia   . Diverticulosis of colon   . GERD (gastroesophageal reflux disease)   . Headache(784.0)   . Hemorrhoids, internal   . Hyperlipidemia   . Hyperparathyroidism ([place]) 01/2016  . Hyperplastic colonic polyp   . Hypertension   . IBS (irritable bowel syndrome)   . PONV (postoperative nausea and vomiting)   . Seasonal allergies   . Ulcerative colitis     Past Surgical History:  Procedure Laterality Date  . COLONOSCOPY    . MASS EXCISION Left 02/12/2017   Procedure: EXCISION 6x6 CM BACK MASS;  Surgeon: Johnathan Hausen, MD;  Location: [facility];  Service: General;  Laterality: Left;  . none    . PARATHYROIDECTOMY      Current  Medications: Current Meds  Medication Sig  . amLODipine (NORVASC) 10 MG tablet Take 1 tablet (10 mg total) by mouth daily. Please keep upcoming appt in November for future refills. Thank you  . glucose blood (ONETOUCH VERIO) test strip 1 each by Other route daily. And lancets 1/day  . Mesalamine (ASACOL) 400 MG CPDR DR capsule TAKE 4 CAPSULES (1600 MG) THREE TIMES A DAY  . metFORMIN (GLUCOPHAGE-XR) 500 MG 24 hr tablet Take 2 tablets (1,000 mg total) by mouth daily with breakfast. Take 2 tablets (1,000 mg total) by mouth with breakfast; E11.8  . metoprolol succinate (TOPROL-XL) 50 MG 24 hr tablet TAKE 1 TABLET DAILY WITH OR IMMEDIATELY FOLLOWING A MEAL. Please keep upcoming appt in November for future refills. Thank you  . ONETOUCH DELICA LANCETS 63J MISC 1 each by Does not apply route daily.     Allergies:   Patient has no known allergies.   Social History   Socioeconomic History  . Marital status: Married    Spouse name: Not on file  . Number of children: Not on file  . Years of education: Not on file  . Highest education level: Not on file  Occupational History  . Not on file  Social Needs  . Financial resource strain: Not on file  . Food insecurity    Worry: Not on file    Inability: Not on file  .  Transportation needs    Medical: Not on file    Non-medical: Not on file  Tobacco Use  . Smoking status: Former Smoker    Quit date: 12/05/1979    Years since quitting: 39.1  . Smokeless tobacco: Never Used  Substance and Sexual Activity  . Alcohol use: No    Comment: rare  . Drug use: No  . Sexual activity: Not on file  Lifestyle  . Physical activity    Days per week: Not on file    Minutes per session: Not on file  . Stress: Not on file  Relationships  . Social Herbalist on phone: Not on file    Gets together: Not on file    Attends religious service: Not on file    Active member of club or organization: Not on file    Attends meetings of clubs or  organizations: Not on file    Relationship status: Not on file  Other Topics Concern  . 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 Marriage is in good health. Work is usual amount of stress.      Family History: The patient's family history includes Breast cancer in his mother; Heart disease in his father; Hypertension in an other family member; Hypertrophic cardiomyopathy in his sister. There is no history of Colon cancer, Stomach cancer, Cancer, or Hyperparathyroidism.  ROS:   Please see the history of present illness.     All other systems reviewed and are negative.  EKGs/Labs/Other Studies Reviewed:    The following studies were reviewed today: Prior echocardiogram personally reviewed, lab work, EKG  EKG:  EKG is ordered today.  Sinus rhythm 65 LVH  right bundle branch block left anterior fascicular block, previous sinus rhythm.  Recent Labs: 09/28/2018: BUN 22; Creatinine, Ser 0.90; Potassium 4.0; Sodium 139; TSH 2.05  Recent Lipid Panel    Component Value Date/Time   CHOL 196 09/28/2018 0911   TRIG 89.0 09/28/2018 0911   HDL 41.60 09/28/2018 0911   CHOLHDL 5 09/28/2018 0911   VLDL 17.8 09/28/2018 0911   LDLCALC 136 (H) 09/28/2018 0911   LDLDIRECT 166.7 08/24/2012 1218    Physical Exam:    VS:  BP (!) 160/80   Pulse 65   Ht 6' 1"  (1.854 m)   Wt 222 lb (100.7 kg)   SpO2 98%   BMI 29.29 kg/m     Wt Readings from Last 3 Encounters:  01/12/19 222 lb (100.7 kg)  09/28/18 223 lb (101.2 kg)  03/18/18 229 lb 3.2 oz (104 kg)     GEN:  Well nourished, well developed in no acute distress HEENT: Normal NECK: No JVD; No carotid bruits LYMPHATICS: No lymphadenopathy CARDIAC: RRR, no murmurs, rubs, gallops RESPIRATORY:  Clear to auscultation without rales, wheezing or rhonchi   ABDOMEN: Soft, non-tender, non-distended MUSCULOSKELETAL:  No edema; No deformity  SKIN: Warm and dry NEUROLOGIC:  Alert and oriented x 3 PSYCHIATRIC:  Normal affect   ASSESSMENT:    1. Diabetes mellitus with coincident hypertension ([place])   2. Essential hypertension   3. RBBB   4. Nonspecific abnormal electrocardiogram (ECG) (EKG)   5. Abnormal EKG   6. Hyperlipidemia with target LDL less than 100    PLAN:    In order of problems listed above:  Abnormal EKG -Check exercise treadmill test.  Right bundle branch block/left anterior fascicular block -EKG remained stable.  Asymptomatic no syncope. - Previously normal EF.  Personally reviewed his echocardiogram once again and his wall thickness was approximately 12.9 mm.  I do not think that he has evidence of hypertrophic obstructive cardiomyopathy.  His sister was previously diagnosed with this.  The way he tells the story, she had an echocardiogram with dye.  The cardiologist interpreting this said that this was 1 of the most severe cases of hypertrophic cardiomyopathy he had seen and sent the case to [facility] in [place] for further evaluation.  In a few days, she was set up for a heart catheterization.  After that heart catheterization she was reassured that it was not as severe.  Many of the family members relatives are being checked however.  We had lengthy discussion about this.  Risk factors such as wall thickness greater than 30, unexplained syncope, first-degree relatives with sudden death, obstructive evidence on echocardiogram, sustained arrhythmias, none of these have been discovered in him.  I do think that checking an exercise treadmill test would be helpful for him to exclude any possible arrhythmias.  Prior nuclear stress test was Lexiscan only.  Essential hypertension - This is likely the biggest concern from a cardiovascular standpoint.  Blood pressure still remains quite elevated.  Dr. Ronnald Ramp is watching this closely.   Continue to monitor this closely. Felt poor with losartan prior.  I spoke to the benefits with renal function and diabetes.  Diabetes with hypertension - He is going to see a endocrinologist.  Prior hemoglobin A1c was 6.1 he states recently.  He needs to exercise, lose weight, he has done a good job of losing a few pounds..  I also agree with use of statin in the setting but he does not wish to take this.  Continue to encourage.  Ultimately, I offered him a repeat echocardiogram to make sure that the wall thickness is not progressed, desires to defer this at this time.  We will likely check in the next year or 2.  continue to treat hypertension.  We will see him back in 1 year.  Medication Adjustments/Labs and Tests Ordered: Current medicines are reviewed at length with the patient today.  Concerns regarding medicines are outlined above.  Orders Placed This Encounter  Procedures  . EKG 12-Lead   No orders of the defined types were placed in this encounter.   Patient Instructions  Medication Instructions:  The current medical regimen is effective;  continue present plan and medications.  *If you need a refill on your cardiac medications before your next appointment, please call your pharmacy*  Follow-Up: At [facility], you and your health needs are our priority.  As part of our continuing mission to provide you with exceptional heart care, we have created designated Provider Care Teams.  These Care Teams include your primary Cardiologist (physician) and Advanced Practice Providers (APPs -  Physician Assistants and Nurse Practitioners) who all work together to provide you with the care you need, when you need it.  Your next appointment:   12 months  The format for your next appointment:   In Person  Provider:   You may see Candee Furbish, MD or one of the following Advanced Practice Providers on your designated Care Team:    Truitt Merle, NP  Cecilie Kicks, NP  Kathyrn Drown,  NP   Thank you for choosing [facility]!!  Signed, Candee Furbish, MD  01/12/2019 9:40 AM    [facility]

## 2019-01-12 NOTE — Patient Instructions (Signed)
Medication Instructions:  The current medical regimen is effective;  continue present plan and medications.  *If you need a refill on your cardiac medications before your next appointment, please call your pharmacy*  Follow-Up: At [facility], you and your health needs are our priority.  As part of our continuing mission to provide you with exceptional heart care, we have created designated Provider Care Teams.  These Care Teams include your primary Cardiologist (physician) and Advanced Practice Providers (APPs -  Physician Assistants and Nurse Practitioners) who all work together to provide you with the care you need, when you need it.  Your next appointment:   12 months  The format for your next appointment:   In Person  Provider:   You may see Candee Furbish, MD or one of the following Advanced Practice Providers on your designated Care Team:    Truitt Merle, NP  Cecilie Kicks, NP  Kathyrn Drown, NP   Thank you for choosing [facility]!!

## 2019-01-19 ENCOUNTER — Ambulatory Visit (AMBULATORY_SURGERY_CENTER): Payer: BC Managed Care – PPO | Admitting: *Deleted

## 2019-01-19 ENCOUNTER — Encounter: Payer: Self-pay | Admitting: Gastroenterology

## 2019-01-19 ENCOUNTER — Other Ambulatory Visit: Payer: Self-pay

## 2019-01-19 VITALS — Temp 96.6°F | Ht 73.0 in | Wt 222.0 lb

## 2019-01-19 DIAGNOSIS — Z1159 Encounter for screening for other viral diseases: Secondary | ICD-10-CM

## 2019-01-19 DIAGNOSIS — K5150 Left sided colitis without complications: Secondary | ICD-10-CM

## 2019-01-19 DIAGNOSIS — Z86010 Personal history of colonic polyps: Secondary | ICD-10-CM

## 2019-01-19 MED ORDER — NA SULFATE-K SULFATE-MG SULF 17.5-3.13-1.6 GM/177ML PO SOLN: ORAL | 0 refills | Status: DC

## 2019-01-19 NOTE — Progress Notes (Signed)
Patient is here in-person for PV. Patient denies any allergies to eggs or soy. Patient denies any problems with anesthesia/sedation. Patient denies any oxygen use at home. Patient denies taking any diet/weight loss medications or blood thinners. Patient is not being treated for MRSA or C-diff.   COVID-19 screening test is on Monday 11/30 per pt request , the pt is aware. Pt is aware that care partner will wait in the car during procedure; if they feel like they will be too hot or cold to wait in the car; they may wait in the 4 th floor lobby. Patient is aware to bring only one care partner. We want them to wear a mask (we do not have any that we can provide them), practice social distancing, and we will check their temperatures when they get here.  I did remind the patient that their care partner needs to stay in the parking lot the entire time and have a cell phone available, we will call them when the pt is ready for discharge. Patient will wear mask into building.    Suprep $15 off coupon given to the patient.

## 2019-01-30 ENCOUNTER — Ambulatory Visit (INDEPENDENT_AMBULATORY_CARE_PROVIDER_SITE_OTHER): Payer: BC Managed Care – PPO

## 2019-01-30 ENCOUNTER — Other Ambulatory Visit: Payer: Self-pay | Admitting: Gastroenterology

## 2019-01-30 DIAGNOSIS — Z1159 Encounter for screening for other viral diseases: Secondary | ICD-10-CM

## 2019-01-31 LAB — SARS CORONAVIRUS 2 (TAT 6-24 HRS): SARS Coronavirus 2: NEGATIVE

## 2019-02-02 ENCOUNTER — Encounter: Payer: BC Managed Care – PPO | Admitting: Gastroenterology

## 2019-02-16 ENCOUNTER — Ambulatory Visit: Payer: BC Managed Care – PPO | Admitting: Endocrinology

## 2019-03-13 ENCOUNTER — Other Ambulatory Visit: Payer: Self-pay | Admitting: Cardiology

## 2019-03-16 ENCOUNTER — Encounter: Payer: Self-pay | Admitting: Internal Medicine

## 2019-03-23 ENCOUNTER — Ambulatory Visit: Payer: BC Managed Care – PPO | Admitting: Endocrinology

## 2019-03-31 ENCOUNTER — Ambulatory Visit: Payer: BC Managed Care – PPO

## 2019-04-08 ENCOUNTER — Ambulatory Visit: Payer: BC Managed Care – PPO

## 2019-05-18 ENCOUNTER — Other Ambulatory Visit: Payer: Self-pay

## 2019-05-19 ENCOUNTER — Ambulatory Visit: Payer: BC Managed Care – PPO | Admitting: Endocrinology

## 2019-05-19 ENCOUNTER — Encounter: Payer: Self-pay | Admitting: Endocrinology

## 2019-05-19 VITALS — BP 164/80 | HR 84 | Ht 73.0 in | Wt 222.0 lb

## 2019-05-19 DIAGNOSIS — E118 Type 2 diabetes mellitus with unspecified complications: Secondary | ICD-10-CM

## 2019-05-19 DIAGNOSIS — Z23 Encounter for immunization: Secondary | ICD-10-CM

## 2019-05-19 LAB — POCT GLYCOSYLATED HEMOGLOBIN (HGB A1C): Hemoglobin A1C: 6.7 % — AB (ref 4.0–5.6)

## 2019-05-19 NOTE — Patient Instructions (Addendum)
Your blood pressure is high today.  Please see your primary care provider soon, to have it rechecked Please continue the same metformin.  It is best to not miss the vitamin-D.  However, if you do miss it, next best is to double up the next time.   Please come back for a follow-up appointment in 4 months.

## 2019-05-19 NOTE — Progress Notes (Signed)
Subjective:    Patient ID: Patrick Floyd, male    DOB: 1946/02/03, 74 y.o.   MRN: [id number]  HPI Pt has secondary hyperparathyroidism (dx'ed 2010 (it was normal in 2008); he has never had osteoporosis, urolithiasis, or bony fracture; he had resection of RUP parathyroid in 2018).   Pt also returns for f/u of diabetes mellitus:  DM type: 2 Dx'ed: 2947 Complications: none.  Therapy: metformin DKA: never Severe hypoglycemia: never Pancreatitis: never Pancreatic imaging: normal in 2014 CT Other: edema limits rx options; he has never been on insulin.   Interval history: pt states he feels well in general.  He takes meds as rx'ed.   He takes vitamin-D intermittently.  Denies cramps.   Past Medical History:  Diagnosis Date  . Anal fissure   . Basal cell carcinoma   . Complication of anesthesia    PONV  . Diabetes mellitus without complication ([place])   . Diverticulosis of colon   . GERD (gastroesophageal reflux disease)   . Headache(784.0)   . Hemorrhoids, internal   . Hyperlipidemia   . Hyperparathyroidism ([place]) 01/2016  . Hyperplastic colonic polyp   . Hypertension   . IBS (irritable bowel syndrome)   . PONV (postoperative nausea and vomiting)   . Seasonal allergies   . Ulcerative colitis     Past Surgical History:  Procedure Laterality Date  . COLONOSCOPY  last 12/24/2015  . MASS EXCISION Left 02/12/2017   Procedure: EXCISION 6x6 CM BACK MASS;  Surgeon: Johnathan Hausen, MD;  Location: [facility];  Service: General;  Laterality: Left;  . none    . PARATHYROIDECTOMY      Social History   Socioeconomic History  . Marital status: Married    Spouse name: Not on file  . Number of children: Not on file  . Years of education: Not on file  . Highest education level: Not on file  Occupational History  . Not on file  Tobacco Use  . Smoking status: Former Smoker    Quit date: 12/05/1979    Years since quitting: 39.4  . Smokeless tobacco: Never Used  Substance and Sexual  Activity  . Alcohol use: Not Currently    Comment: rare  . Drug use: No  . Sexual activity: Not on file  Other Topics Concern  . 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 Marriage is in good health. Work is usual amount of stress.    Social Determinants of Health   Financial Resource Strain:   . Difficulty of Paying Living Expenses:   Food Insecurity:   . Worried About Charity fundraiser in the Last Year:   . Arboriculturist in the Last Year:   Transportation Needs:   . Film/video editor (Medical):   Marland Kitchen Lack of Transportation (Non-Medical):   Physical Activity:   . Days of Exercise per Week:   . Minutes of Exercise per Session:   Stress:   . Feeling of Stress :   Social Connections:   . Frequency of Communication with Friends and Family:   . Frequency of Social Gatherings with Friends and Family:   . Attends Religious Services:   . Active Member of Clubs or Organizations:   . Attends Archivist Meetings:   Marland Kitchen Marital Status:   Intimate  Partner Violence:   . Fear of Current or Ex-Partner:   . Emotionally Abused:   Marland Kitchen Physically Abused:   . Sexually Abused:     Current Outpatient Medications on File Prior to Visit  Medication Sig Dispense Refill  . amLODipine (NORVASC) 10 MG tablet Take 1 tablet (10 mg total) by mouth daily. 90 tablet 3  . glucose blood (ONETOUCH VERIO) test strip 1 each by Other route daily. And lancets 1/day 100 each 12  . Mesalamine (ASACOL) 400 MG CPDR DR capsule TAKE 4 CAPSULES (1600 MG) THREE TIMES A DAY 1080 capsule 3  . metFORMIN (GLUCOPHAGE-XR) 500 MG 24 hr tablet Take 2 tablets (1,000 mg total) by mouth daily with breakfast. Take 2 tablets (1,000 mg total) by mouth with breakfast; E11.8 180 tablet 11  . metoprolol succinate (TOPROL-XL) 50  MG 24 hr tablet TAKE 1 TABLET DAILY WITH OR IMMEDIATELY FOLLOWING A MEAL 90 tablet 3  . Na Sulfate-K Sulfate-Mg Sulf 17.5-3.13-1.6 GM/177ML SOLN Suprep (no substitutions)-TAKE AS DIRECTED. 354 mL 0  . ONETOUCH DELICA LANCETS 26S MISC 1 each by Does not apply route daily. 100 each 11  . VITAMIN D PO Take by mouth.     No current facility-administered medications on file prior to visit.    No Known Allergies  Family History  Problem Relation Age of Onset  . Hypertension Other   . Breast cancer Mother   . Heart disease Father   . Hypertrophic cardiomyopathy Sister   . Colon cancer Neg Hx   . Stomach cancer Neg Hx   . Cancer Neg Hx        Colon and Prostate  . Hyperparathyroidism Neg Hx   . Colon polyps Neg Hx   . Esophageal cancer Neg Hx   . Rectal cancer Neg Hx     BP (!) 164/80   Pulse 84   Ht 6' 1"  (1.854 m)   Wt 222 lb (100.7 kg)   SpO2 98%   BMI 29.29 kg/m    Review of Systems Denies numbness    Objective:   Physical Exam VITAL SIGNS:  See vs page GENERAL: no distress Pulses: dorsalis pedis intact bilat.   MSK: no deformity of the feet CV: 1+ bilat leg edema Skin:  no ulcer on the feet.  normal color and temp on the feet.  Neuro: sensation is intact to touch on the feet.    Lab Results  Component Value Date   HGBA1C 6.7 (A) 05/19/2019       Assessment & Plan:  HTN: is noted today.  Type 2 DM: well-controlled.  Vit-D def: therapy limited by noncompliance.    Patient Instructions  Your blood pressure is high today.  Please see your primary care provider soon, to have it rechecked Please continue the same metformin.  It is best to not miss the vitamin-D.  However, if you do miss it, next best is to double up the next time.   Please come back for a follow-up appointment in 4 months.

## 2019-06-05 ENCOUNTER — Other Ambulatory Visit: Payer: Self-pay | Admitting: Internal Medicine

## 2019-08-25 ENCOUNTER — Encounter: Payer: Self-pay | Admitting: Gastroenterology

## 2019-09-01 ENCOUNTER — Telehealth: Payer: Self-pay | Admitting: Gastroenterology

## 2019-09-01 NOTE — Telephone Encounter (Signed)
No contraindication for any antibiotic from UC standpoint. Would follow the advice of your dentist.

## 2019-09-01 NOTE — Telephone Encounter (Signed)
Patient with a history of UC.  Okay for him to take clindamycin?  If please recommend acceptable alternatives

## 2019-09-01 NOTE — Telephone Encounter (Signed)
I left a detailed message for the patient.  He is asked to call for any additional questions or concerns.

## 2019-09-11 DIAGNOSIS — M25552 Pain in left hip: Secondary | ICD-10-CM | POA: Diagnosis not present

## 2019-09-11 DIAGNOSIS — M7062 Trochanteric bursitis, left hip: Secondary | ICD-10-CM | POA: Diagnosis not present

## 2019-09-22 ENCOUNTER — Ambulatory Visit: Payer: BC Managed Care – PPO | Admitting: Endocrinology

## 2019-09-22 DIAGNOSIS — M25552 Pain in left hip: Secondary | ICD-10-CM | POA: Diagnosis not present

## 2019-10-04 ENCOUNTER — Encounter: Payer: BC Managed Care – PPO | Admitting: Gastroenterology

## 2019-10-24 IMAGING — DX DG CHEST 2V
2 series · 2 of 2 positions shown · non-contrast
Comparison: None in PACs

CLINICAL DATA: Cough, shortness of breath, and headache since
yesterday. History of hypertension, ulcerative colitis, diabetes,
former smoker.

EXAM:
CHEST  2 VIEW

[chest pa]
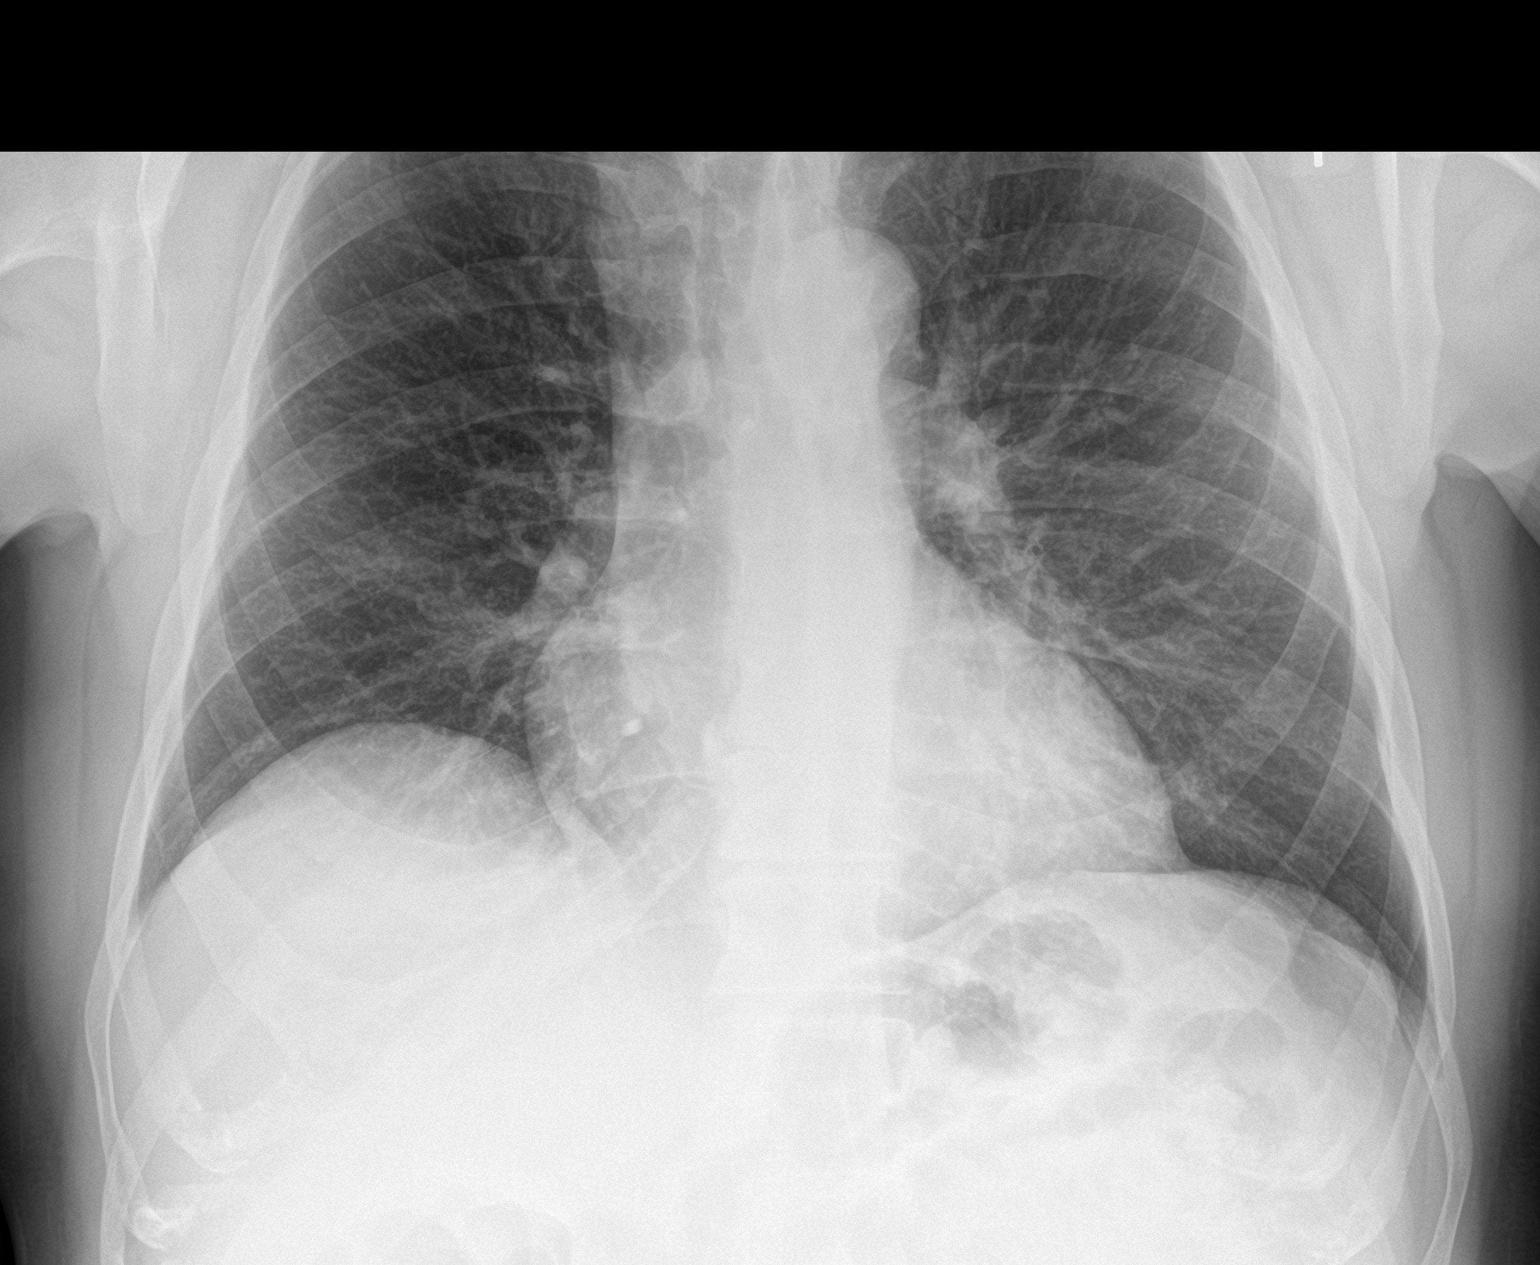

[chest lat]
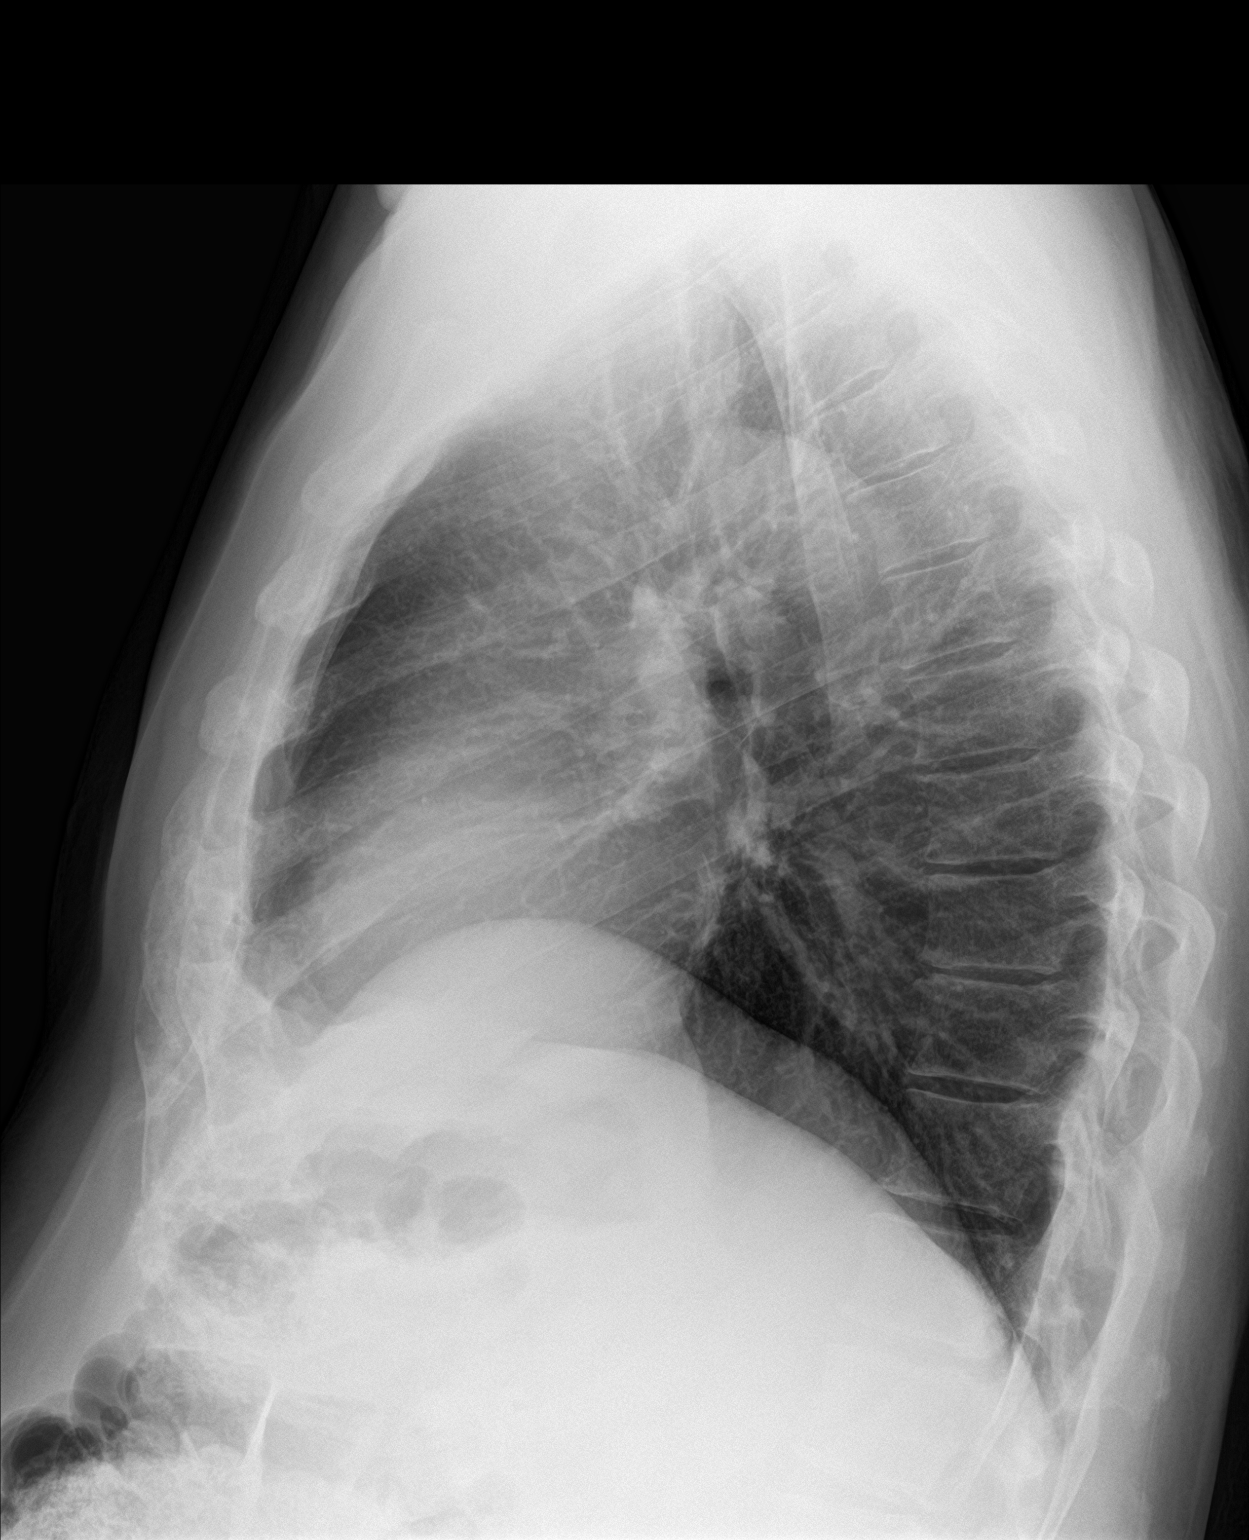

[2 of 2 positions shown; findings below may reference images not displayed]

FINDINGS: The lungs are adequately inflated. There is no focal infiltrate. The
interstitial markings are coarse bilaterally. The heart and
pulmonary vascularity are normal. The mediastinum is normal in
width. There is faint calcification in the wall of the aortic arch.
The bony thorax is unremarkable.
IMPRESSION: Mild chronic bronchitic changes.  No alveolar pneumonia nor CHF.

Thoracic aortic atherosclerosis.

## 2019-11-09 ENCOUNTER — Encounter: Payer: Self-pay | Admitting: Endocrinology

## 2019-11-09 ENCOUNTER — Ambulatory Visit: Payer: BC Managed Care – PPO | Admitting: Endocrinology

## 2019-11-09 ENCOUNTER — Other Ambulatory Visit: Payer: Self-pay

## 2019-11-09 VITALS — BP 134/80 | HR 69 | Ht 73.0 in | Wt 220.4 lb

## 2019-11-09 DIAGNOSIS — E213 Hyperparathyroidism, unspecified: Secondary | ICD-10-CM

## 2019-11-09 DIAGNOSIS — E118 Type 2 diabetes mellitus with unspecified complications: Secondary | ICD-10-CM | POA: Diagnosis not present

## 2019-11-09 LAB — POCT GLYCOSYLATED HEMOGLOBIN (HGB A1C): Hemoglobin A1C: 6.7 % — AB (ref 4.0–5.6)

## 2019-11-09 LAB — BASIC METABOLIC PANEL
BUN: 16 mg/dL (ref 6–23)
CO2: 32 mEq/L (ref 19–32)
Calcium: 8.9 mg/dL (ref 8.4–10.5)
Chloride: 101 mEq/L (ref 96–112)
Creatinine, Ser: 0.87 mg/dL (ref 0.40–1.50)
GFR: 86.32 mL/min (ref >60.00)
Glucose, Bld: 139 mg/dL — ABNORMAL HIGH (ref 70–99)
Potassium: 3.6 mEq/L (ref 3.5–5.1)
Sodium: 140 mEq/L (ref 135–145)

## 2019-11-09 LAB — TSH: TSH: 2.69 u[IU]/mL (ref 0.35–4.50)

## 2019-11-09 LAB — VITAMIN D 25 HYDROXY (VIT D DEFICIENCY, FRACTURES): VITD: 16.16 ng/mL — ABNORMAL LOW (ref 30.00–100.00)

## 2019-11-09 LAB — T4, FREE: Free T4: 0.81 ng/dL (ref 0.60–1.60)

## 2019-11-09 MED ORDER — DAPAGLIFLOZIN PROPANEDIOL 5 MG PO TABS: 5.0000 mg | ORAL_TABLET | Freq: Every day | ORAL | 3 refills | Status: DC

## 2019-11-09 NOTE — Progress Notes (Signed)
Subjective:    Patient ID: Patrick Floyd, male    DOB: 1945/07/22, 74 y.o.   MRN: [id number]  HPI Pt has secondary hyperparathyroidism (dx'ed 2010 (it was normal in 2008); he has never had osteoporosis, urolithiasis, or bony fracture; he had resection of RUP parathyroid in 2018).   Pt also returns for f/u of diabetes mellitus:  DM type: 2 Dx'ed: 8676 Complications: none.  Therapy: metformin DKA: never Severe hypoglycemia: never Pancreatitis: never Pancreatic imaging: normal in 2014 CT Other: edema limits rx options; he has never been on insulin; he does not check cbg's.   Interval history: pt states he feels well in general.  He takes meds as rx'ed.   He takes vitamin-D intermittently.  Denies cramps.   Past Medical History:  Diagnosis Date  . Anal fissure   . Basal cell carcinoma   . Complication of anesthesia    PONV  . Diabetes mellitus without complication ([place])   . Diverticulosis of colon   . GERD (gastroesophageal reflux disease)   . Headache(784.0)   . Hemorrhoids, internal   . Hyperlipidemia   . Hyperparathyroidism ([place]) 01/2016  . Hyperplastic colonic polyp   . Hypertension   . IBS (irritable bowel syndrome)   . PONV (postoperative nausea and vomiting)   . Seasonal allergies   . Ulcerative colitis     Past Surgical History:  Procedure Laterality Date  . COLONOSCOPY  last 12/24/2015  . MASS EXCISION Left 02/12/2017   Procedure: EXCISION 6x6 CM BACK MASS;  Surgeon: Johnathan Hausen, MD;  Location: [facility];  Service: General;  Laterality: Left;  . none    . PARATHYROIDECTOMY      Social History   Socioeconomic History  . Marital status: Married    Spouse name: Not on file  . Number of children: Not on file  . Years of education: Not on file  . Highest education level: Not on file  Occupational History  . Not on file  Tobacco Use  . Smoking status: Former Smoker    Quit date: 12/05/1979    Years since quitting: 39.9  . Smokeless tobacco: Never Used   Vaping Use  . Vaping Use: Never used  Substance and Sexual Activity  . Alcohol use: Not Currently    Comment: rare  . Drug use: No  . Sexual activity: Not on file  Other Topics Concern  . 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 Marriage is in good health. Work is usual amount of stress.    Social Determinants of Health   Financial Resource Strain:   . Difficulty of Paying Living Expenses: Not on file  Food Insecurity:   . Worried About Charity fundraiser in the Last Year: Not on file  . Ran Out of Food in the Last Year: Not on file  Transportation Needs:   . Lack of Transportation (Medical): Not on file  . Lack of Transportation (Non-Medical): Not on file  Physical Activity:   . Days of Exercise per Week: Not on file  . Minutes of Exercise per Session: Not on file  Stress:   . Feeling of Stress : Not on file  Social Connections:   . Frequency of Communication with Friends and Family: Not on file  . Frequency of Social Gatherings with Friends and  Family: Not on file  . Attends Religious Services: Not on file  . Active Member of Clubs or Organizations: Not on file  . Attends Archivist Meetings: Not on file  . Marital Status: Not on file  Intimate Partner Violence:   . Fear of Current or Ex-Partner: Not on file  . Emotionally Abused: Not on file  . Physically Abused: Not on file  . Sexually Abused: Not on file    Current Outpatient Medications on File Prior to Visit  Medication Sig Dispense Refill  . amLODipine (NORVASC) 10 MG tablet Take 1 tablet (10 mg total) by mouth daily. 90 tablet 3  . glucose blood (ONETOUCH VERIO) test strip 1 each by Other route daily. And lancets 1/day 100 each 12  . Mesalamine (ASACOL) 400 MG CPDR DR capsule TAKE 4 CAPSULES (1600 MG)  THREE TIMES A DAY 1080 capsule 3  . metFORMIN (GLUCOPHAGE-XR) 500 MG 24 hr tablet TAKE 2 TABLETS DAILY WITH BREAKFAST 180 tablet 3  . metoprolol succinate (TOPROL-XL) 50 MG 24 hr tablet TAKE 1 TABLET DAILY WITH OR IMMEDIATELY FOLLOWING A MEAL 90 tablet 3  . Na Sulfate-K Sulfate-Mg Sulf 17.5-3.13-1.6 GM/177ML SOLN Suprep (no substitutions)-TAKE AS DIRECTED. 354 mL 0  . ONETOUCH DELICA LANCETS 59R MISC 1 each by Does not apply route daily. 100 each 11  . VITAMIN D PO Take by mouth.     No current facility-administered medications on file prior to visit.    No Known Allergies  Family History  Problem Relation Age of Onset  . Hypertension Other   . Breast cancer Mother   . Heart disease Father   . Hypertrophic cardiomyopathy Sister   . Colon cancer Neg Hx   . Stomach cancer Neg Hx   . Cancer Neg Hx        Colon and Prostate  . Hyperparathyroidism Neg Hx   . Colon polyps Neg Hx   . Esophageal cancer Neg Hx   . Rectal cancer Neg Hx     BP 134/80   Pulse 69   Ht 6' 1"  (1.854 m)   Wt 220 lb 6.4 oz (100 kg)   SpO2 96%   BMI 29.08 kg/m    Review of Systems Denies nausea.      Objective:   Physical Exam VITAL SIGNS:  See vs page GENERAL: no distress Pulses: dorsalis pedis intact bilat.   MSK: no deformity of the feet CV: 1+ bilat leg edema Skin:  no ulcer on the feet.  normal color and temp on the feet. Neuro: sensation is intact to touch on the feet  Lab Results  Component Value Date   CREATININE 0.90 09/28/2018   BUN 22 09/28/2018   NA 139 09/28/2018   K 4.0 09/28/2018   CL 101 09/28/2018   CO2 32 09/28/2018    Lab Results  Component Value Date   HGBA1C 6.7 (A) 11/09/2019       Assessment & Plan:  Type 2 DM: he would benefit from increased rx, if it can be done with a regimen that avoids or minimizes hypoglycemia. Vit-D def: recheck today.   Patient Instructions  Please continue the same metformin.   I have sent a prescription to your pharmacy, to add  "Farxiga." Blood tests are requested for you today.  We'll let you know about the results.  Please come back for a follow-up appointment in 4-5 months.

## 2019-11-09 NOTE — Patient Instructions (Addendum)
Please continue the same metformin.   I have sent a prescription to your pharmacy, to add "Farxiga." Blood tests are requested for you today.  We'll let you know about the results.  Please come back for a follow-up appointment in 4-5 months.

## 2019-11-10 LAB — PTH, INTACT AND CALCIUM
Calcium: 8.9 mg/dL (ref 8.6–10.3)
PTH: 50 pg/mL (ref 14–64)

## 2020-01-19 ENCOUNTER — Ambulatory Visit: Payer: BC Managed Care – PPO | Admitting: Cardiology

## 2020-01-23 ENCOUNTER — Other Ambulatory Visit: Payer: Self-pay

## 2020-01-23 ENCOUNTER — Encounter: Payer: Self-pay | Admitting: Cardiology

## 2020-01-23 ENCOUNTER — Ambulatory Visit: Payer: BC Managed Care – PPO | Admitting: Cardiology

## 2020-01-23 VITALS — BP 160/80 | HR 60 | Ht 73.0 in | Wt 218.4 lb

## 2020-01-23 DIAGNOSIS — I4510 Unspecified right bundle-branch block: Secondary | ICD-10-CM | POA: Diagnosis not present

## 2020-01-23 DIAGNOSIS — I422 Other hypertrophic cardiomyopathy: Secondary | ICD-10-CM

## 2020-01-23 DIAGNOSIS — I10 Essential (primary) hypertension: Secondary | ICD-10-CM | POA: Diagnosis not present

## 2020-01-23 NOTE — Progress Notes (Signed)
Cardiology Office Note:    Date:  01/23/2020   ID:  Patrick Floyd, DOB 04-Feb-1946, MRN [id number]  PCP:  Janith Lima, MD  [facility] Cardiologist:  Candee Furbish, MD  [facility] Electrophysiologist:  None   Referring MD: Janith Lima, MD     History of Present Illness:    Patrick Floyd is a 74 y.o. male here for the follow-up of hypertrophic Floyd.  Sister  has a diagnosis of this as well, diagnosed in [place].  No chest pain no fevers no chills no syncope no bleeding.  Has bifascicular block.  No change on ECG.  In the past we discussed ACE inhibitor or angiotensin receptor blocker in the setting of diabetes.  He felt poor on these medications.  He has had no high risk symptoms such as syncope, orthopnea, dangerous arrhythmias. Overall been doing quite well. Takes Toprol.  Past Medical History:  Diagnosis Date  . Anal fissure   . Basal cell carcinoma   . Complication of anesthesia    PONV  . Diabetes mellitus without complication ([place])   . Diverticulosis of colon   . GERD (gastroesophageal reflux disease)   . Headache(784.0)   . Hemorrhoids, internal   . Hyperlipidemia   . Hyperparathyroidism ([place]) 01/2016  . Hyperplastic colonic polyp   . Hypertension   . IBS (irritable bowel syndrome)   . PONV (postoperative nausea and vomiting)   . Seasonal allergies   . Ulcerative colitis     Past Surgical History:  Procedure Laterality Date  . COLONOSCOPY  last 12/24/2015  . MASS EXCISION Left 02/12/2017   Procedure: EXCISION 6x6 CM BACK MASS;  Surgeon: Johnathan Hausen, MD;  Location: [facility];  Service: General;  Laterality: Left;  . none    . PARATHYROIDECTOMY      Current Medications: Current Meds  Medication Sig  . amLODipine (NORVASC) 10 MG tablet Take 1 tablet (10 mg total) by mouth daily.  . Mesalamine (ASACOL) 400 MG CPDR DR capsule TAKE 4 CAPSULES (1600 MG) THREE TIMES A DAY  . metFORMIN (GLUCOPHAGE-XR) 500 MG 24 hr tablet TAKE 2  TABLETS DAILY WITH BREAKFAST  . metoprolol succinate (TOPROL-XL) 50 MG 24 hr tablet TAKE 1 TABLET DAILY WITH OR IMMEDIATELY FOLLOWING A MEAL  . Na Sulfate-K Sulfate-Mg Sulf 17.5-3.13-1.6 GM/177ML SOLN Suprep (no substitutions)-TAKE AS DIRECTED.  Marland Kitchen VITAMIN D PO Take by mouth.     Allergies:   Patient has no known allergies.   Social History   Socioeconomic History  . Marital status: Married    Spouse name: Not on file  . Number of children: Not on file  . Years of education: Not on file  . Highest education level: Not on file  Occupational History  . Not on file  Tobacco Use  . Smoking status: Former Smoker    Quit date: 12/05/1979    Years since quitting: 40.1  . Smokeless tobacco: Never Used  Vaping Use  . Vaping Use: Never used  Substance and Sexual Activity  . Alcohol use: Not Currently    Comment: rare  . Drug use: No  . Sexual activity: Not on file  Other Topics Concern  . 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 Marriage is in good health. Work is usual amount of stress.  Social Determinants of Health   Financial Resource Strain:   . Difficulty of Paying Living Expenses: Not on file  Food Insecurity:   . Worried About Charity fundraiser in the Last Year: Not on file  . Ran Out of Food in the Last Year: Not on file  Transportation Needs:   . Lack of Transportation (Medical): Not on file  . Lack of Transportation (Non-Medical): Not on file  Physical Activity:   . Days of Exercise per Week: Not on file  . Minutes of Exercise per Session: Not on file  Stress:   . Feeling of Stress : Not on file  Social Connections:   . Frequency of Communication with Friends and Family: Not on file  . Frequency of Social Gatherings with Friends and Family: Not on file  . Attends  Religious Services: Not on file  . Active Member of Clubs or Organizations: Not on file  . Attends Archivist Meetings: Not on file  . Marital Status: Not on file     Family History: The patient's family history includes Breast cancer in his mother; Heart disease in his father; Hypertension in an other family member; Hypertrophic Floyd in his sister. There is no history of Colon cancer, Stomach cancer, Cancer, Hyperparathyroidism, Colon polyps, Esophageal cancer, or Rectal cancer.  ROS:   Please see the history of present illness.    No fevers chills nausea vomiting syncope bleeding all other systems reviewed and are negative.  EKGs/Labs/Other Studies Reviewed:    The following studies were reviewed today:  ECHO 2018: - Left ventricle: The cavity size was normal. Wall thickness was  increased in a pattern of severe LVH. There was focal basal  hypertrophy. Systolic function was normal. The estimated ejection  fraction was in the range of 60% to 65%. Wall motion was normal;  there were no regional wall motion abnormalities. Features are  consistent with a pseudonormal left ventricular filling pattern,  with concomitant abnormal relaxation and increased filling  pressure (grade 2 diastolic dysfunction).  - Aortic valve: There was trivial regurgitation.   ETT 2019:  There was no ST segment deviation noted during stress.   EKG:  EKG is  ordered today.  The ekg ordered today demonstrates sinus rhythm 60 right bundle branch block left interventricular block bifascicular block  Recent Labs: 11/09/2019: BUN 16; Creatinine, Ser 0.87; Potassium 3.6; Sodium 140; TSH 2.69  Recent Lipid Panel    Component Value Date/Time   CHOL 196 09/28/2018 0911   TRIG 89.0 09/28/2018 0911   HDL 41.60 09/28/2018 0911   CHOLHDL 5 09/28/2018 0911   VLDL 17.8 09/28/2018 0911   LDLCALC 136 (H) 09/28/2018 0911   LDLDIRECT 166.7 08/24/2012 1218      Physical Exam:     VS:  BP (!) 160/80   Pulse 60   Ht 6' 1"  (1.854 m)   Wt 218 lb 6.4 oz (99.1 kg)   SpO2 98%   BMI 28.81 kg/m     Wt Readings from Last 3 Encounters:  01/23/20 218 lb 6.4 oz (99.1 kg)  11/09/19 220 lb 6.4 oz (100 kg)  05/19/19 222 lb (100.7 kg)     GEN:  Well nourished, well developed in no acute distress HEENT: Normal NECK: No JVD; No carotid bruits LYMPHATICS: No lymphadenopathy CARDIAC: RRR, no murmurs, rubs, gallops RESPIRATORY:  Clear to auscultation without rales, wheezing or rhonchi  ABDOMEN: Soft, non-tender, non-distended MUSCULOSKELETAL:  No edema; No deformity  SKIN: Warm and dry  NEUROLOGIC:  Alert and oriented x 3 PSYCHIATRIC:  Normal affect   ASSESSMENT:    1. Hypertrophic Floyd ([place])   2. Essential hypertension   3. RBBB    PLAN:    In order of problems listed above:  Right bundle branch block left anterior fascicular block, bifascicular block -No syncope. Stable ECG. Discussed potential pacemaker needs in the future.  Hypertrophic Floyd -Previously normal EF wall thickness posteriorly increased to 12.9 mm, septal wall thickness was 17 mm.  Does not have any high risk factor categories, see below.  His sister was diagnosed with this.  Many family members have been checked.  Risk factors such as wall thickness greater than 30 mm portend high risk, unexplained syncope, first-degree relatives with sudden death, obstructive evidence on echocardiogram, sustained arrhythmias.  None of these were discovered in him.  Essential hypertension -Continue to monitor.  Diet exercise.  Felt poor on losartan.  Diabetes with hypertension -He is working with endocrinology-hemoglobin A1c 6.7 previously.  Shared Decision Making/Informed Consent        Medication Adjustments/Labs and Tests Ordered: Current medicines are reviewed at length with the patient today.  Concerns regarding medicines are outlined above.  Orders Placed This Encounter   Procedures  . EKG 12-Lead   No orders of the defined types were placed in this encounter.   Patient Instructions  Medication Instructions:  The current medical regimen is effective;  continue present plan and medications.  *If you need a refill on your cardiac medications before your next appointment, please call your pharmacy*  Follow-Up: At [facility], you and your health needs are our priority.  As part of our continuing mission to provide you with exceptional heart care, we have created designated Provider Care Teams.  These Care Teams include your primary Cardiologist (physician) and Advanced Practice Providers (APPs -  Physician Assistants and Nurse Practitioners) who all work together to provide you with the care you need, when you need it.  We recommend signing up for the patient portal called "MyChart".  Sign up information is provided on this After Visit Summary.  MyChart is used to connect with patients for Virtual Visits (Telemedicine).  Patients are able to view lab/test results, encounter notes, upcoming appointments, etc.  Non-urgent messages can be sent to your provider as well.   To learn more about what you can do with MyChart, go to [email or web address].    Your next appointment:   12 month(s)  The format for your next appointment:   In Person  Provider:   Candee Furbish, MD   Thank you for choosing [facility]!!        Signed, Candee Furbish, MD  01/23/2020 12:12 PM    [place]

## 2020-01-23 NOTE — Patient Instructions (Signed)
Medication Instructions:  The current medical regimen is effective;  continue present plan and medications.  *If you need a refill on your cardiac medications before your next appointment, please call your pharmacy*  Follow-Up: At [facility], you and your health needs are our priority.  As part of our continuing mission to provide you with exceptional heart care, we have created designated Provider Care Teams.  These Care Teams include your primary Cardiologist (physician) and Advanced Practice Providers (APPs -  Physician Assistants and Nurse Practitioners) who all work together to provide you with the care you need, when you need it.  We recommend signing up for the patient portal called "MyChart".  Sign up information is provided on this After Visit Summary.  MyChart is used to connect with patients for Virtual Visits (Telemedicine).  Patients are able to view lab/test results, encounter notes, upcoming appointments, etc.  Non-urgent messages can be sent to your provider as well.   To learn more about what you can do with MyChart, go to [email or web address].    Your next appointment:   12 month(s)  The format for your next appointment:   In Person  Provider:   Mark Skains, MD   Thank you for choosing [facility]!!      

## 2020-01-31 ENCOUNTER — Encounter: Payer: Self-pay | Admitting: Internal Medicine

## 2020-03-02 ENCOUNTER — Other Ambulatory Visit: Payer: Self-pay | Admitting: Cardiology

## 2020-03-11 ENCOUNTER — Encounter: Payer: Self-pay | Admitting: Internal Medicine

## 2020-03-26 ENCOUNTER — Other Ambulatory Visit: Payer: Self-pay

## 2020-03-27 ENCOUNTER — Other Ambulatory Visit: Payer: Self-pay

## 2020-03-27 ENCOUNTER — Encounter: Payer: Self-pay | Admitting: Internal Medicine

## 2020-03-27 ENCOUNTER — Ambulatory Visit (INDEPENDENT_AMBULATORY_CARE_PROVIDER_SITE_OTHER): Payer: BC Managed Care – PPO | Admitting: Internal Medicine

## 2020-03-27 ENCOUNTER — Encounter: Payer: Self-pay | Admitting: Gastroenterology

## 2020-03-27 VITALS — BP 126/82 | HR 67 | Temp 97.8°F | Ht 73.0 in | Wt 216.0 lb

## 2020-03-27 DIAGNOSIS — E118 Type 2 diabetes mellitus with unspecified complications: Secondary | ICD-10-CM

## 2020-03-27 DIAGNOSIS — E785 Hyperlipidemia, unspecified: Secondary | ICD-10-CM

## 2020-03-27 DIAGNOSIS — Z23 Encounter for immunization: Secondary | ICD-10-CM

## 2020-03-27 DIAGNOSIS — I10 Essential (primary) hypertension: Secondary | ICD-10-CM

## 2020-03-27 DIAGNOSIS — Z0000 Encounter for general adult medical examination without abnormal findings: Secondary | ICD-10-CM | POA: Diagnosis not present

## 2020-03-27 DIAGNOSIS — N400 Enlarged prostate without lower urinary tract symptoms: Secondary | ICD-10-CM | POA: Diagnosis not present

## 2020-03-27 DIAGNOSIS — K5130 Ulcerative (chronic) rectosigmoiditis without complications: Secondary | ICD-10-CM | POA: Diagnosis not present

## 2020-03-27 DIAGNOSIS — Z01810 Encounter for preprocedural cardiovascular examination: Secondary | ICD-10-CM | POA: Insufficient documentation

## 2020-03-27 DIAGNOSIS — Z0001 Encounter for general adult medical examination with abnormal findings: Secondary | ICD-10-CM | POA: Insufficient documentation

## 2020-03-27 LAB — CBC WITH DIFFERENTIAL/PLATELET
Basophils Absolute: 0.1 10*3/uL (ref 0.0–0.1)
Basophils Relative: 0.6 % (ref 0.0–3.0)
Eosinophils Absolute: 0.2 10*3/uL (ref 0.0–0.7)
Eosinophils Relative: 2.4 % (ref 0.0–5.0)
HCT: 42.7 % (ref 39.0–52.0)
Hemoglobin: 14.5 g/dL (ref 13.0–17.0)
Lymphocytes Relative: 23.4 % (ref 12.0–46.0)
Lymphs Abs: 2.1 10*3/uL (ref 0.7–4.0)
MCHC: 33.9 g/dL (ref 30.0–36.0)
MCV: 84.2 fl (ref 78.0–100.0)
Monocytes Absolute: 0.7 10*3/uL (ref 0.1–1.0)
Monocytes Relative: 8.2 % (ref 3.0–12.0)
Neutro Abs: 5.9 10*3/uL (ref 1.4–7.7)
Neutrophils Relative %: 65.4 % (ref 43.0–77.0)
Platelets: 248 10*3/uL (ref 150.0–400.0)
RBC: 5.07 Mil/uL (ref 4.22–5.81)
RDW: 13.9 % (ref 11.5–15.5)
WBC: 9.1 10*3/uL (ref 4.0–10.5)

## 2020-03-27 LAB — HEPATIC FUNCTION PANEL
ALT: 18 U/L (ref 0–53)
AST: 14 U/L (ref 0–37)
Albumin: 4.4 g/dL (ref 3.5–5.2)
Alkaline Phosphatase: 63 U/L (ref 39–117)
Bilirubin, Direct: 0.2 mg/dL (ref 0.0–0.3)
Total Bilirubin: 1.3 mg/dL — ABNORMAL HIGH (ref 0.2–1.2)
Total Protein: 7 g/dL (ref 6.0–8.3)

## 2020-03-27 LAB — BASIC METABOLIC PANEL
BUN: 21 mg/dL (ref 6–23)
CO2: 32 mEq/L (ref 19–32)
Calcium: 9.8 mg/dL (ref 8.4–10.5)
Chloride: 101 mEq/L (ref 96–112)
Creatinine, Ser: 0.88 mg/dL (ref 0.40–1.50)
GFR: 86.13 mL/min (ref >60.00)
Glucose, Bld: 143 mg/dL — ABNORMAL HIGH (ref 70–99)
Potassium: 4.7 mEq/L (ref 3.5–5.1)
Sodium: 139 mEq/L (ref 135–145)

## 2020-03-27 LAB — URINALYSIS, ROUTINE W REFLEX MICROSCOPIC
Bilirubin Urine: NEGATIVE
Hgb urine dipstick: NEGATIVE
Ketones, ur: NEGATIVE
Leukocytes,Ua: NEGATIVE
Nitrite: NEGATIVE
RBC / HPF: NONE SEEN (ref 0–?)
Specific Gravity, Urine: 1.02 (ref 1.000–1.030)
Total Protein, Urine: NEGATIVE
Urine Glucose: 250 — AB
Urobilinogen, UA: 0.2 (ref 0.0–1.0)
pH: 6.5 (ref 5.0–8.0)

## 2020-03-27 LAB — HEMOGLOBIN A1C: Hgb A1c MFr Bld: 6.8 % — ABNORMAL HIGH (ref 4.6–6.5)

## 2020-03-27 LAB — LIPID PANEL
Cholesterol: 195 mg/dL (ref 0–200)
HDL: 42.4 mg/dL (ref >39.00)
LDL Cholesterol: 134 mg/dL — ABNORMAL HIGH (ref 0–99)
NonHDL: 152.44
Total CHOL/HDL Ratio: 5
Triglycerides: 90 mg/dL (ref 0.0–149.0)
VLDL: 18 mg/dL (ref 0.0–40.0)

## 2020-03-27 LAB — TSH: TSH: 2.36 u[IU]/mL (ref 0.35–4.50)

## 2020-03-27 LAB — MICROALBUMIN / CREATININE URINE RATIO
Creatinine,U: 71.1 mg/dL
Microalb Creat Ratio: 12.6 mg/g (ref 0.0–30.0)
Microalb, Ur: 8.9 mg/dL — ABNORMAL HIGH (ref 0.0–1.9)

## 2020-03-27 LAB — PSA: PSA: 1.14 ng/mL (ref 0.10–4.00)

## 2020-03-27 MED ORDER — SHINGRIX 50 MCG/0.5ML IM SUSR: 0.5000 mL | Freq: Once | INTRAMUSCULAR | 1 refills | Status: AC

## 2020-03-27 NOTE — Progress Notes (Signed)
Subjective:  Patient ID: Patrick Floyd, male    DOB: 03/24/1945  Age: 75 y.o. MRN: [id number]  CC: Annual Exam, Hyperlipidemia, Diabetes, and Hypertension  This visit occurred during the SARS-CoV-2 public health emergency.  Safety protocols were in place, including screening questions prior to the visit, additional usage of staff PPE, and extensive cleaning of exam room while observing appropriate contact time as indicated for disinfecting solutions.    HPI SYAIRE SABER presents for a CPX.  He tells me his blood pressure and blood sugar have been well controlled.  He is active and denies any recent episodes of chest pain, shortness of breath, dizziness, lightheadedness, or diaphoresis.  Outpatient Medications Prior to Visit  Medication Sig Dispense Refill  . amLODipine (NORVASC) 10 MG tablet TAKE 1 TABLET DAILY 90 tablet 3  . metFORMIN (GLUCOPHAGE-XR) 500 MG 24 hr tablet TAKE 2 TABLETS DAILY WITH BREAKFAST 180 tablet 3  . metoprolol succinate (TOPROL-XL) 50 MG 24 hr tablet TAKE 1 TABLET DAILY WITH OR IMMEDIATELY FOLLOWING A MEAL 90 tablet 3  . Na Sulfate-K Sulfate-Mg Sulf 17.5-3.13-1.6 GM/177ML SOLN Suprep (no substitutions)-TAKE AS DIRECTED. 354 mL 0  . VITAMIN D PO Take by mouth.    . Mesalamine (ASACOL) 400 MG CPDR DR capsule TAKE 4 CAPSULES (1600 MG) THREE TIMES A DAY 1080 capsule 3   No facility-administered medications prior to visit.    ROS Review of Systems  Constitutional: Negative.  Negative for appetite change, diaphoresis, fatigue and unexpected weight change.  HENT: Negative.   Eyes: Negative.   Respiratory: Negative for cough, chest tightness, shortness of breath and wheezing.   Cardiovascular: Negative for chest pain, palpitations and leg swelling.  Gastrointestinal: Negative for abdominal pain, constipation, diarrhea, nausea and vomiting.  Endocrine: Negative.  Negative for polydipsia, polyphagia and polyuria.  Genitourinary: Negative.  Negative for  difficulty urinating, dysuria, scrotal swelling and testicular pain.  Musculoskeletal: Negative.  Negative for arthralgias and myalgias.  Skin: Negative.   Neurological: Negative.  Negative for dizziness, weakness, light-headedness and headaches.  Hematological: Negative for adenopathy. Does not bruise/bleed easily.  Psychiatric/Behavioral: Negative.     Objective:  BP 126/82   Pulse 67   Temp 97.8 F (36.6 C) (Oral)   Ht 6' 1"  (1.854 m)   Wt 216 lb (98 kg)   SpO2 96%   BMI 28.50 kg/m   BP Readings from Last 3 Encounters:  03/27/20 126/82  01/23/20 (!) 160/80  11/09/19 134/80    Wt Readings from Last 3 Encounters:  03/27/20 216 lb (98 kg)  01/23/20 218 lb 6.4 oz (99.1 kg)  11/09/19 220 lb 6.4 oz (100 kg)    Physical Exam Vitals reviewed. Exam conducted with a chaperone present.  Constitutional:      Appearance: Normal appearance.  HENT:     Nose: Nose normal.     Mouth/Throat:     Mouth: Mucous membranes are moist.  Eyes:     General: No scleral icterus.    Conjunctiva/sclera: Conjunctivae normal.  Cardiovascular:     Rate and Rhythm: Normal rate and regular rhythm.     Pulses: Normal pulses.     Heart sounds: No murmur heard.   Pulmonary:     Effort: Pulmonary effort is normal.     Breath sounds: No stridor. No wheezing, rhonchi or rales.  Abdominal:     General: Abdomen is flat.     Palpations: There is no mass.     Tenderness: There is no abdominal tenderness.  There is no guarding.     Hernia: No hernia is present. There is no hernia in the left inguinal area or right inguinal area.  Genitourinary:    Pubic Area: No rash.      Penis: Normal.      Testes: Normal.        Right: Mass or tenderness not present.        Left: Mass or tenderness not present.     Epididymis:     Right: Normal.     Left: Normal.     Prostate: Enlarged (1+ smooth symm BPH). Not tender and no nodules present.     Rectum: Normal. Guaiac result negative. No mass, tenderness,  anal fissure, external hemorrhoid or internal hemorrhoid. Normal anal tone.  Musculoskeletal:        General: Normal range of motion.     Cervical back: Neck supple.     Right lower leg: Edema (trace pitting) present.     Left lower leg: Edema (trace pitting) present.  Lymphadenopathy:     Cervical: No cervical adenopathy.     Lower Body: No right inguinal adenopathy. No left inguinal adenopathy.  Skin:    General: Skin is warm and dry.     Coloration: Skin is not pale.  Neurological:     General: No focal deficit present.     Mental Status: He is alert and oriented to person, place, and time. Mental status is at baseline.  Psychiatric:        Mood and Affect: Mood normal.        Behavior: Behavior normal.     Lab Results  Component Value Date   WBC 9.1 03/27/2020   HGB 14.5 03/27/2020   HCT 42.7 03/27/2020   PLT 248.0 03/27/2020   GLUCOSE 143 (H) 03/27/2020   CHOL 195 03/27/2020   TRIG 90.0 03/27/2020   HDL 42.40 03/27/2020   LDLDIRECT 166.7 08/24/2012   LDLCALC 134 (H) 03/27/2020   ALT 18 03/27/2020   AST 14 03/27/2020   NA 139 03/27/2020   K 4.7 03/27/2020   CL 101 03/27/2020   CREATININE 0.88 03/27/2020   BUN 21 03/27/2020   CO2 32 03/27/2020   TSH 2.36 03/27/2020   PSA 1.14 03/27/2020   HGBA1C 6.8 (H) 03/27/2020   MICROALBUR 8.9 (H) 03/27/2020    DG Chest 2 View  Result Date: 03/15/2017 CLINICAL DATA:  Cough, shortness of breath, and headache since yesterday. History of hypertension, ulcerative colitis, diabetes, former smoker. EXAM: CHEST  2 VIEW COMPARISON:  None in PACs FINDINGS: The lungs are adequately inflated. There is no focal infiltrate. The interstitial markings are coarse bilaterally. The heart and pulmonary vascularity are normal. The mediastinum is normal in width. There is faint calcification in the wall of the aortic arch. The bony thorax is unremarkable. IMPRESSION: Mild chronic bronchitic changes.  No alveolar pneumonia nor CHF. Thoracic aortic  atherosclerosis. Electronically Signed   By: David  Martinique M.D.   On: 03/15/2017 11:26    Assessment & Plan:   Karriem was seen today for annual exam, hyperlipidemia, diabetes and hypertension.  Diagnoses and all orders for this visit:  Essential hypertension- His blood pressure is well controlled but he has trace pitting lower extremity edema and mild microalbuminuria.  I recommended that he stop taking the CCB and start an ARB. -     CBC with Differential/Platelet; Future -     TSH; Future -     Urinalysis, Routine w reflex microscopic;  Future -     Basic metabolic panel; Future -     Basic metabolic panel -     Urinalysis, Routine w reflex microscopic -     TSH -     CBC with Differential/Platelet -     irbesartan (AVAPRO) 75 MG tablet; Take 1 tablet (75 mg total) by mouth daily.  Hyperlipidemia with target LDL less than 100- I recommended that he start taking a statin for CV risk reduction. -     Lipid panel; Future -     TSH; Future -     Hepatic function panel; Future -     Hepatic function panel -     TSH -     Lipid panel -     rosuvastatin (CRESTOR) 20 MG tablet; Take 1 tablet (20 mg total) by mouth daily.  Benign prostatic hyperplasia without lower urinary tract symptoms- His PSA is low which is a reassuring sign that he does not have prostate cancer.  He has no symptoms that need to be treated. 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 -     Ambulatory referral to Ophthalmology -     HM Diabetes Foot Exam -     Microalbumin / creatinine urine ratio; Future -     Hemoglobin A1c; Future -     Basic metabolic panel; Future -     Basic metabolic panel -     Hemoglobin A1c -     Microalbumin / creatinine urine ratio -     irbesartan (AVAPRO) 75 MG tablet; Take 1 tablet (75 mg  total) by mouth daily.  Encounter for general adult medical examination with abnormal findings- Exam completed, labs reviewed, vaccines reviewed and updated, cancer screenings addressed, patient education was given.  Need for shingles vaccine -     Zoster Vaccine Adjuvanted [facility]) injection; Inject 0.5 mLs into the muscle once for 1 dose.  Other orders -     Pneumococcal polysaccharide vaccine 23-valent greater than or equal to 2yo subcutaneous/IM   I have discontinued [name]. Wiland's Mesalamine. I am also having him start on Shingrix, rosuvastatin, and irbesartan. Additionally, I am having him maintain his VITAMIN D PO, Na Sulfate-K Sulfate-Mg Sulf, metFORMIN, metoprolol succinate, and amLODipine.  Meds ordered this encounter  Medications  . Zoster Vaccine Adjuvanted [facility]) injection    Sig: Inject 0.5 mLs into the muscle once for 1 dose.    Dispense:  0.5 mL    Refill:  1  . rosuvastatin (CRESTOR) 20 MG tablet    Sig: Take 1 tablet (20 mg total) by mouth daily.    Dispense:  90 tablet    Refill:  1  . irbesartan (AVAPRO) 75 MG tablet    Sig: Take 1 tablet (75 mg total) by mouth daily.    Dispense:  90 tablet    Refill:  1     Follow-up: Return in about 6 months (around 09/24/2020).  Scarlette Calico, MD

## 2020-03-27 NOTE — Patient Instructions (Signed)

## 2020-03-28 MED ORDER — IRBESARTAN 75 MG PO TABS: 75.0000 mg | ORAL_TABLET | Freq: Every day | ORAL | 1 refills | Status: DC

## 2020-03-28 MED ORDER — ROSUVASTATIN CALCIUM 20 MG PO TABS: 20.0000 mg | ORAL_TABLET | Freq: Every day | ORAL | 1 refills | Status: DC

## 2020-03-29 ENCOUNTER — Encounter: Payer: Self-pay | Admitting: Internal Medicine

## 2020-04-04 ENCOUNTER — Encounter: Payer: Self-pay | Admitting: Internal Medicine

## 2020-04-05 ENCOUNTER — Telehealth: Payer: Self-pay | Admitting: Internal Medicine

## 2020-04-05 ENCOUNTER — Other Ambulatory Visit: Payer: Self-pay | Admitting: Internal Medicine

## 2020-04-05 NOTE — Telephone Encounter (Signed)
My notes indicate that he said he was no longer taking it  TJ

## 2020-04-05 NOTE — Telephone Encounter (Signed)
° °  Patient was looking at his AVS and was wondering why Dr. Ronnald Ramp took him off the Mesalamine. States he has been taking it for years and it has helped with the ulcerative colitis so he doesn't know why it was removed. [phone]

## 2020-04-08 ENCOUNTER — Other Ambulatory Visit: Payer: Self-pay | Admitting: Internal Medicine

## 2020-04-08 DIAGNOSIS — E118 Type 2 diabetes mellitus with unspecified complications: Secondary | ICD-10-CM

## 2020-04-08 DIAGNOSIS — I10 Essential (primary) hypertension: Secondary | ICD-10-CM

## 2020-04-08 MED ORDER — IRBESARTAN 75 MG PO TABS
75.0000 mg | ORAL_TABLET | Freq: Every day | ORAL | 1 refills | Status: DC
Start: 2020-04-08 — End: 2021-02-10

## 2020-04-08 NOTE — Telephone Encounter (Signed)
Called pt, LVM.   

## 2020-04-08 NOTE — Telephone Encounter (Signed)
yes

## 2020-04-08 NOTE — Telephone Encounter (Signed)
Med list has been updated

## 2020-04-08 NOTE — Telephone Encounter (Signed)
Patient is still on this medication and would like it to be put back on his medication list.

## 2020-04-08 NOTE — Telephone Encounter (Signed)
Ok to add back?

## 2020-04-11 ENCOUNTER — Ambulatory Visit: Payer: BC Managed Care – PPO | Admitting: Endocrinology

## 2020-05-13 ENCOUNTER — Other Ambulatory Visit: Payer: Self-pay

## 2020-05-13 ENCOUNTER — Ambulatory Visit (AMBULATORY_SURGERY_CENTER): Payer: Self-pay

## 2020-05-13 VITALS — Ht 73.0 in | Wt 214.0 lb

## 2020-05-13 DIAGNOSIS — K5150 Left sided colitis without complications: Secondary | ICD-10-CM

## 2020-05-13 DIAGNOSIS — Z860100 Personal history of colon polyps, unspecified: Secondary | ICD-10-CM

## 2020-05-13 MED ORDER — PLENVU 140 G PO SOLR: 1.0000 | ORAL | 0 refills | Status: DC

## 2020-05-13 NOTE — Progress Notes (Signed)
No egg or soy allergy known to patient  No issues with past sedation with any surgeries or procedures No intubation problems in the past  No FH of Malignant Hyperthermia No diet pills per patient No home 02 use per patient  No blood thinners per patient  Pt denies issues with constipation  No A fib or A flutter  EMMI video via [place] 19 guidelines implemented in PV today with Pt and RN  Pt is fully vaccinated for Covid  Coupon given to pt in PV today, Code to Pharmacy and  NO PA's for preps discussed with pt in PV today  Discussed with pt there will be an out-of-pocket cost for prep and that varies from $0 to 70 dollars; Due to the COVID-19 pandemic we are asking patients to follow certain guidelines. Pt aware of COVID protocols and [facility] guidelines

## 2020-05-16 ENCOUNTER — Telehealth: Payer: Self-pay | Admitting: Gastroenterology

## 2020-05-16 DIAGNOSIS — Z86010 Personal history of colonic polyps: Secondary | ICD-10-CM

## 2020-05-16 MED ORDER — PLENVU 140 G PO SOLR: 1.0000 | Freq: Once | ORAL | 0 refills | Status: AC

## 2020-05-16 NOTE — Telephone Encounter (Signed)
Pt states he would like for the PLENVU to be sent to [place], and pt states he also misplaced the coupon.

## 2020-05-16 NOTE — Telephone Encounter (Signed)
plenvu RX sent to [facility] per pt's request. Coupon information attached to RX patient called and notified.

## 2020-05-27 ENCOUNTER — Ambulatory Visit (AMBULATORY_SURGERY_CENTER): Payer: BC Managed Care – PPO | Admitting: Gastroenterology

## 2020-05-27 ENCOUNTER — Encounter: Payer: Self-pay | Admitting: Gastroenterology

## 2020-05-27 ENCOUNTER — Other Ambulatory Visit: Payer: Self-pay

## 2020-05-27 VITALS — BP 134/72 | HR 64 | Temp 98.0°F | Resp 17 | Ht 73.0 in | Wt 214.0 lb

## 2020-05-27 DIAGNOSIS — K5150 Left sided colitis without complications: Secondary | ICD-10-CM | POA: Diagnosis not present

## 2020-05-27 DIAGNOSIS — Z1211 Encounter for screening for malignant neoplasm of colon: Secondary | ICD-10-CM | POA: Diagnosis not present

## 2020-05-27 DIAGNOSIS — K5100 Ulcerative (chronic) pancolitis without complications: Secondary | ICD-10-CM

## 2020-05-27 DIAGNOSIS — K621 Rectal polyp: Secondary | ICD-10-CM

## 2020-05-27 DIAGNOSIS — D128 Benign neoplasm of rectum: Secondary | ICD-10-CM

## 2020-05-27 DIAGNOSIS — Z86010 Personal history of colonic polyps: Secondary | ICD-10-CM

## 2020-05-27 DIAGNOSIS — Z8719 Personal history of other diseases of the digestive system: Secondary | ICD-10-CM

## 2020-05-27 HISTORY — PX: COLONOSCOPY WITH PROPOFOL: SHX5780

## 2020-05-27 MED ORDER — MESALAMINE 400 MG PO CPDR: 400.0000 mg | DELAYED_RELEASE_CAPSULE | Freq: Three times a day (TID) | ORAL | 6 refills | Status: DC

## 2020-05-27 MED ORDER — SODIUM CHLORIDE 0.9 % IV SOLN
500.0000 mL | Freq: Once | INTRAVENOUS | Status: DC
Start: 2020-05-27 — End: 2020-05-27

## 2020-05-27 NOTE — Progress Notes (Signed)
Called to room to assist during endoscopic procedure.  Patient ID and intended procedure confirmed with present staff. Received instructions for my participation in the procedure from the performing physician.  

## 2020-05-27 NOTE — Progress Notes (Signed)
Pt. Reports no change in his medical or surgical history since his pre-visit 05/13/2020.

## 2020-05-27 NOTE — Op Note (Addendum)
[facility] Patient Name: Patrick Floyd Procedure Date: 05/27/2020 9:38 AM MRN: [id number] Endoscopist: Ladene Artist , MD Age: 75 Referring MD:  Date of Birth: September 28, 1945 Gender: Male Account #: [id number] Procedure:                Colonoscopy Indications:              High risk colon cancer surveillance: Ulcerative                            left sided colitis of 8 (or more) years duration Medicines:                Monitored Anesthesia Care Procedure:                Pre-Anesthesia Assessment:                           - Prior to the procedure, a History and Physical                            was performed, and patient medications and                            allergies were reviewed. The patient's tolerance of                            previous anesthesia was also reviewed. The risks                            and benefits of the procedure and the sedation                            options and risks were discussed with the patient.                            All questions were answered, and informed consent                            was obtained. Prior Anticoagulants: The patient has                            taken no previous anticoagulant or antiplatelet                            agents. ASA Grade Assessment: II - A patient with                            mild systemic disease. After reviewing the risks                            and benefits, the patient was deemed in                            satisfactory condition to undergo the procedure.  After obtaining informed consent, the colonoscope                            was passed under direct vision. Throughout the                            procedure, the patient's blood pressure, pulse, and                            oxygen saturations were monitored continuously. The                            Colonoscope was introduced through the anus and                            advanced to the  the cecum, identified by                            appendiceal orifice and ileocecal valve. The                            ileocecal valve, appendiceal orifice, and rectum                            were photographed. The quality of the bowel                            preparation was adequate after extensive lavage,                            suction. The colonoscopy was performed without                            difficulty. The patient tolerated the procedure                            well. Scope In: 9:42:45 AM Scope Out: 10:06:01 AM Scope Withdrawal Time: 0 hours 16 minutes 56 seconds  Total Procedure Duration: 0 hours 23 minutes 16 seconds  Findings:                 The perianal and digital rectal examinations were                            normal.                           Two sessile polyps were found in the rectum. The                            polyps were 4 to 6 mm in size. These polyps were                            removed with a cold snare. Resection and retrieval  were complete.                           Multiple medium-mouthed diverticula were found in                            the left colon. There was narrowing of the colon in                            association with the diverticular opening. There                            was evidence of diverticular spasm.                            Peri-diverticular erythema was seen. There was no                            evidence of diverticular bleeding.                           Internal hemorrhoids were found during                            retroflexion. The hemorrhoids were moderate and                            Grade I (internal hemorrhoids that do not prolapse).                           The exam was otherwise without abnormality on                            direct and retroflexion views. Random biopsies                            obtained throughout. Complications:            No  immediate complications. Estimated blood loss:                            None. Estimated Blood Loss:     Estimated blood loss: none. Impression:               - Two 4 to 6 mm polyps in the rectum, removed with                            a cold snare. Resected and retrieved.                           - Moderate diverticulosis in the left colon.                           - Internal hemorrhoids.                           -  The examination was otherwise normal on direct                            and retroflexion views. Random biopsies obtained                            throughtout. Recommendation:           - Repeat colonoscopy in 3 years for surveillance                            based on pathology results with a more extensive                            bowel prep.                           - Patient has a contact number available for                            emergencies. The signs and symptoms of potential                            delayed complications were discussed with the                            patient. Return to normal activities tomorrow.                            Written discharge instructions were provided to the                            patient.                           - High fiber diet.                           - Continue present medications.                           - Refill mesalamine 400 mg, take 4 po tid, 1 year                            of refills.                           - GI office visit in 1 year.                           - Await pathology results. Ladene Artist, MD 05/27/2020 10:13:41 AM This report has been signed electronically.

## 2020-05-27 NOTE — Patient Instructions (Signed)
Handout on polyps given to you today  Await pathology results   YOU HAD AN ENDOSCOPIC PROCEDURE TODAY AT [facility]:   Refer to the procedure report that was given to you for any specific questions about what was found during the examination.  If the procedure report does not answer your questions, please call your gastroenterologist to clarify.  If you requested that your care partner not be given the details of your procedure findings, then the procedure report has been included in a sealed envelope for you to review at your convenience later.  YOU SHOULD EXPECT: Some feelings of bloating in the abdomen. Passage of more gas than usual.  Walking can help get rid of the air that was put into your GI tract during the procedure and reduce the bloating. If you had a lower endoscopy (such as a colonoscopy or flexible sigmoidoscopy) you may notice spotting of blood in your stool or on the toilet paper. If you underwent a bowel prep for your procedure, you may not have a normal bowel movement for a few days.  Please Note:  You might notice some irritation and congestion in your nose or some drainage.  This is from the oxygen used during your procedure.  There is no need for concern and it should clear up in a day or so.  SYMPTOMS TO REPORT IMMEDIATELY:   Following lower endoscopy (colonoscopy or flexible sigmoidoscopy):  Excessive amounts of blood in the stool  Significant tenderness or worsening of abdominal pains  Swelling of the abdomen that is new, acute  Fever of 100F or higher  For urgent or emergent issues, a gastroenterologist can be reached at any hour by calling [phone]. Do not use MyChart messaging for urgent concerns.    DIET:  We do recommend a small meal at first, but then you may proceed to your regular diet.  Drink plenty of fluids but you should avoid alcoholic beverages for 24 hours.  ACTIVITY:  You should plan to take it easy for the rest of today and  you should NOT DRIVE or use heavy machinery until tomorrow (because of the sedation medicines used during the test).    FOLLOW UP: Our staff will call the number listed on your records 48-72 hours following your procedure to check on you and address any questions or concerns that you may have regarding the information given to you following your procedure. If we do not reach you, we will leave a message.  We will attempt to reach you two times.  During this call, we will ask if you have developed any symptoms of COVID 19. If you develop any symptoms (ie: fever, flu-like symptoms, shortness of breath, cough etc.) before then, please call [phone].  If you test positive for Covid 19 in the 2 weeks post procedure, please call and report this information to [place].    If any biopsies were taken you will be contacted by phone or by letter within the next 1-3 weeks.  Please call us at [phone] if you have not heard about the biopsies in 3 weeks.    SIGNATURES/CONFIDENTIALITY: You and/or your care partner have signed paperwork which will be entered into your electronic medical record.  These signatures attest to the fact that that the information above on your After Visit Summary has been reviewed and is understood.  Full responsibility of the confidentiality of this discharge information lies with you and/or your care-partner.

## 2020-05-27 NOTE — Progress Notes (Signed)
PT taken to PACU. Monitors in place. VSS. Report given to RN. 

## 2020-05-29 ENCOUNTER — Telehealth: Payer: Self-pay

## 2020-05-29 NOTE — Telephone Encounter (Signed)
  Follow up Call-  Call back number 05/27/2020  Post procedure Call Back phone  # [phone]  Permission to leave phone message Yes  Some recent data might be hidden     Patient questions:  Do you have a fever, pain , or abdominal swelling? No. Pain Score  0 *  Have you tolerated food without any problems? Yes.    Have you been able to return to your normal activities? Yes.    Do you have any questions about your discharge instructions: Diet   No. Medications  No. Follow up visit  No.  Do you have questions or concerns about your Care? No.  Actions: * If pain score is 4 or above: 1. No action needed, pain <4.Have you developed a fever since your procedure? no  2.   Have you had an respiratory symptoms (SOB or cough) since your procedure? no  3.   Have you tested positive for COVID 19 since your procedure no  4.   Have you had any family members/close contacts diagnosed with the COVID 19 since your procedure?  no   If yes to any of these questions please route to Joylene John, RN and Joella Prince, RN

## 2020-06-05 ENCOUNTER — Encounter: Payer: Self-pay | Admitting: Gastroenterology

## 2020-06-13 ENCOUNTER — Other Ambulatory Visit: Payer: Self-pay

## 2020-06-13 ENCOUNTER — Ambulatory Visit: Payer: BC Managed Care – PPO | Admitting: Endocrinology

## 2020-06-13 VITALS — BP 154/78 | HR 63 | Ht 73.0 in | Wt 211.0 lb

## 2020-06-13 DIAGNOSIS — E118 Type 2 diabetes mellitus with unspecified complications: Secondary | ICD-10-CM | POA: Diagnosis not present

## 2020-06-13 LAB — POCT GLYCOSYLATED HEMOGLOBIN (HGB A1C): Hemoglobin A1C: 6.4 % — AB (ref 4.0–5.6)

## 2020-06-13 NOTE — Patient Instructions (Addendum)
Please continue the same metformin.   You should take Vitamin-D, 2000 units per day.   Please come back for a follow-up appointment in 4-5 months.

## 2020-06-13 NOTE — Progress Notes (Signed)
Subjective:    Patient ID: Patrick Floyd, male    DOB: Jun 18, 1945, 75 y.o.   MRN: [id number]  HPI Pt has secondary hyperparathyroidism (dx'ed 2010 (it was normal in 2008); he has never had osteoporosis, urolithiasis, or bony fracture; he had resection of RUP parathyroid in 2018).  He takes Vit-d intermittently.   Pt also returns for f/u of diabetes mellitus:  DM type: 2 Dx'ed: 4097 Complications: none.  Therapy: metformin DKA: never Severe hypoglycemia: never Pancreatitis: never Pancreatic imaging: normal in 2014 CT Other: edema limits rx options; he has never been on insulin; he does not check cbg's.   Interval history: he stopped [place], due to lightheadedness.  He takes meds as rx'ed.   Past Medical History:  Diagnosis Date  . Allergy    seasonal allergies  . Anal fissure   . Anxiety    hx of  . Basal cell carcinoma   . Complication of anesthesia    PONV  . Diabetes mellitus without complication ([place])    on meds  . Diverticulosis of colon   . GERD (gastroesophageal reflux disease)    with certain foods-hx of  . Headache(784.0)   . Hemorrhoids, internal   . Hyperlipidemia    on meds  . Hyperparathyroidism ([place]) 01/2016  . Hyperplastic colonic polyp   . Hypertension    on meds  . IBS (irritable bowel syndrome)   . PONV (postoperative nausea and vomiting)   . Seasonal allergies   . Thyroid disease    parathyroidectomy  . Ulcerative colitis     Past Surgical History:  Procedure Laterality Date  . COLONOSCOPY  last 12/24/2015   MS-MAC-pre good-TICS/TA x 2-3 yr recall  . MASS EXCISION Left 02/12/2017   Procedure: EXCISION 6x6 CM BACK MASS;  Surgeon: Johnathan Hausen, MD;  Location: [facility];  Service: General;  Laterality: Left;  . PARATHYROIDECTOMY    . POLYPECTOMY  2017   TA x 2    Social History   Socioeconomic History  . Marital status: Married    Spouse name: Not on file  . Number of children: Not on file  . Years of education: Not on file  .  Highest education level: Not on file  Occupational History  . Not on file  Tobacco Use  . Smoking status: Former Smoker    Quit date: 12/05/1979    Years since quitting: 40.5  . Smokeless tobacco: Never Used  Vaping Use  . Vaping Use: Never used  Substance and Sexual Activity  . Alcohol use: Not Currently    Comment: rare  . Drug use: No  . Sexual activity: Not on file  Other Topics Concern  . 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 Marriage is in good health. Work is usual amount of stress.    Social Determinants of Health   Financial Resource Strain: Not on file  Food Insecurity: Not on file  Transportation Needs: Not on file  Physical Activity: Not on file  Stress: Not on file  Social Connections: Not on file  Intimate Partner Violence: Not on file    Current Outpatient Medications on File Prior to Visit  Medication Sig Dispense Refill  . amLODipine (NORVASC) 10 MG tablet TAKE 1 TABLET DAILY 90 tablet 3  . chlorhexidine (PERIDEX) 0.12 % solution  daily as needed.    . irbesartan (AVAPRO) 75 MG tablet Take 1 tablet (75 mg total) by mouth daily. 90 tablet 1  . Mesalamine (ASACOL) 400 MG CPDR DR capsule Take 1 capsule (400 mg total) by mouth 3 (three) times daily. 180 capsule 6  . metFORMIN (GLUCOPHAGE-XR) 500 MG 24 hr tablet TAKE 2 TABLETS DAILY WITH BREAKFAST 180 tablet 3  . metoprolol succinate (TOPROL-XL) 50 MG 24 hr tablet TAKE 1 TABLET DAILY WITH OR IMMEDIATELY FOLLOWING A MEAL 90 tablet 3  . rosuvastatin (CRESTOR) 20 MG tablet Take 1 tablet (20 mg total) by mouth daily. 90 tablet 1  . VITAMIN D PO Take by mouth.     No current facility-administered medications on file prior to visit.    No Known Allergies  Family History  Problem Relation Age of Onset  .  Hypertension Other   . Breast cancer Mother   . Heart disease Father   . Hypertrophic cardiomyopathy Sister   . Colon cancer Neg Hx   . Stomach cancer Neg Hx   . Cancer Neg Hx        Colon and Prostate  . Hyperparathyroidism Neg Hx   . Colon polyps Neg Hx   . Esophageal cancer Neg Hx   . Rectal cancer Neg Hx     BP (!) 154/78 (BP Location: Right Arm, Patient Position: Sitting, Cuff Size: Normal)   Pulse 63   Ht _0  (1.854 m)   Wt 211 lb (95.7 kg)   SpO2 97%   BMI 27.84 kg/m    Review of Systems     Objective:   Physical Exam    Lab Results  Component Value Date   CREATININE 0.88 03/27/2020   BUN 21 03/27/2020   NA 139 03/27/2020   K 4.7 03/27/2020   CL 101 03/27/2020   CO2 32 03/27/2020   Lab Results  Component Value Date   HGBA1C 6.4 (A) 06/13/2020   Lab Results  Component Value Date   PTH 50 11/09/2019   CALCIUM 9.8 03/27/2020   PHOS 2.2 (L) 02/13/2016       Assessment & Plan:  Type 2 DM: well-controlled Lightheadedness, due to [place].  Plan is to stay off it. Hyperparathyroidism: I advised pt to take Vit-D, to prevent recurrence.  Patient Instructions  Please continue the same metformin.   You should take Vitamin-D, 2000 units per day.   Please come back for a follow-up appointment in 4-5 months.

## 2020-06-30 ENCOUNTER — Other Ambulatory Visit: Payer: Self-pay | Admitting: Internal Medicine

## 2020-07-15 ENCOUNTER — Encounter: Payer: Self-pay | Admitting: Internal Medicine

## 2020-07-18 DIAGNOSIS — E119 Type 2 diabetes mellitus without complications: Secondary | ICD-10-CM | POA: Diagnosis not present

## 2020-07-18 DIAGNOSIS — H5213 Myopia, bilateral: Secondary | ICD-10-CM | POA: Diagnosis not present

## 2020-07-18 DIAGNOSIS — H524 Presbyopia: Secondary | ICD-10-CM | POA: Diagnosis not present

## 2020-07-18 DIAGNOSIS — H25013 Cortical age-related cataract, bilateral: Secondary | ICD-10-CM | POA: Diagnosis not present

## 2020-07-18 LAB — HM DIABETES EYE EXAM

## 2020-10-01 ENCOUNTER — Encounter: Payer: Self-pay | Admitting: Internal Medicine

## 2020-11-17 ENCOUNTER — Encounter: Payer: Self-pay | Admitting: Internal Medicine

## 2020-12-13 ENCOUNTER — Ambulatory Visit: Payer: BC Managed Care – PPO | Admitting: Endocrinology

## 2020-12-20 ENCOUNTER — Encounter: Payer: Self-pay | Admitting: Internal Medicine

## 2020-12-23 ENCOUNTER — Ambulatory Visit (INDEPENDENT_AMBULATORY_CARE_PROVIDER_SITE_OTHER): Payer: BC Managed Care – PPO | Admitting: Endocrinology

## 2020-12-23 ENCOUNTER — Other Ambulatory Visit: Payer: Self-pay

## 2020-12-23 VITALS — BP 184/80 | HR 67 | Ht 73.0 in | Wt 204.6 lb

## 2020-12-23 DIAGNOSIS — E213 Hyperparathyroidism, unspecified: Secondary | ICD-10-CM | POA: Diagnosis not present

## 2020-12-23 DIAGNOSIS — E785 Hyperlipidemia, unspecified: Secondary | ICD-10-CM

## 2020-12-23 DIAGNOSIS — E118 Type 2 diabetes mellitus with unspecified complications: Secondary | ICD-10-CM | POA: Diagnosis not present

## 2020-12-23 LAB — LIPID PANEL
Cholesterol: 177 mg/dL (ref 0–200)
HDL: 43.3 mg/dL (ref >39.00)
LDL Cholesterol: 120 mg/dL — ABNORMAL HIGH (ref 0–99)
NonHDL: 133.88
Total CHOL/HDL Ratio: 4
Triglycerides: 70 mg/dL (ref 0.0–149.0)
VLDL: 14 mg/dL (ref 0.0–40.0)

## 2020-12-23 LAB — POCT GLYCOSYLATED HEMOGLOBIN (HGB A1C): Hemoglobin A1C: 6.1 % — AB (ref 4.0–5.6)

## 2020-12-23 LAB — VITAMIN D 25 HYDROXY (VIT D DEFICIENCY, FRACTURES): VITD: 25.05 ng/mL — ABNORMAL LOW (ref 30.00–100.00)

## 2020-12-23 NOTE — Progress Notes (Signed)
Subjective:    Patient ID: Patrick Floyd, male    DOB: October 31, 1945, 75 y.o.   MRN: [id number]  HPI Pt has hyperparathyroidism (dx'ed 2010 (it was normal in 2008); he has never had osteoporosis, urolithiasis, or bony fracture; he had resection of RUP parathyroid in 2018).  Pt again says he takes Vit-D intermittently.   Pt also returns for f/u of diabetes mellitus:  DM type: 2 Dx'ed: 6811 Complications: none.  Therapy: metformin DKA: never Severe hypoglycemia: never Pancreatitis: never Pancreatic imaging: normal in 2014 CT Other: edema limits rx options; he has never been on insulin; he does not check cbg's.   Interval history: he stopped [place], due to lightheadedness.  He takes meds as rx'ed.  Past Medical History:  Diagnosis Date   Allergy    seasonal allergies   Anal fissure    Anxiety    hx of   Basal cell carcinoma    Complication of anesthesia    PONV   Diabetes mellitus without complication ([place])    on meds   Diverticulosis of colon    GERD (gastroesophageal reflux disease)    with certain foods-hx of   Headache(784.0)    Hemorrhoids, internal    Hyperlipidemia    on meds   Hyperparathyroidism ([place]) 01/2016   Hyperplastic colonic polyp    Hypertension    on meds   IBS (irritable bowel syndrome)    PONV (postoperative nausea and vomiting)    Seasonal allergies    Thyroid disease    parathyroidectomy   Ulcerative colitis     Past Surgical History:  Procedure Laterality Date   COLONOSCOPY  last 12/24/2015   MS-MAC-pre good-TICS/TA x 2-3 yr recall   MASS EXCISION Left 02/12/2017   Procedure: EXCISION 6x6 CM BACK MASS;  Surgeon: Johnathan Hausen, MD;  Location: [facility];  Service: General;  Laterality: Left;   PARATHYROIDECTOMY     POLYPECTOMY  2017   TA x 2    Social History   Socioeconomic History   Marital status: Married    Spouse name: Not on file   Number of children: Not on file   Years of education: Not on file   Highest education level:  Not on file  Occupational History   Not on file  Tobacco Use   Smoking status: Former    Types: Cigarettes    Quit date: 12/05/1979    Years since quitting: 41.0   Smokeless tobacco: Never  Vaping Use   Vaping Use: Never used  Substance and Sexual Activity   Alcohol use: Not Currently    Comment: rare   Drug use: No   Sexual activity: Not on file  Other Topics Concern   Not on file  Social History Narrative   Forensic psychologist, 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 Marriage is in good health. Work is usual amount of stress.    Social Determinants of Health   Financial Resource Strain: Not on file  Food Insecurity: Not on file  Transportation Needs: Not on file  Physical Activity: Not on file  Stress: Not on file  Social Connections: Not on file  Intimate Partner Violence: Not on file    Current Outpatient Medications on File Prior to Visit  Medication Sig Dispense Refill   amLODipine (NORVASC) 10 MG tablet TAKE 1 TABLET DAILY 90 tablet 3   Mesalamine (  ASACOL) 400 MG CPDR DR capsule Take 1 capsule (400 mg total) by mouth 3 (three) times daily. 180 capsule 6   metFORMIN (GLUCOPHAGE-XR) 500 MG 24 hr tablet TAKE 2 TABLETS DAILY WITH BREAKFAST 180 tablet 3   metoprolol succinate (TOPROL-XL) 50 MG 24 hr tablet TAKE 1 TABLET DAILY WITH OR IMMEDIATELY FOLLOWING A MEAL 90 tablet 3   VITAMIN D PO Take by mouth.     chlorhexidine (PERIDEX) 0.12 % solution daily as needed.     irbesartan (AVAPRO) 75 MG tablet Take 1 tablet (75 mg total) by mouth daily. 90 tablet 1   rosuvastatin (CRESTOR) 20 MG tablet Take 1 tablet (20 mg total) by mouth daily. 90 tablet 1   No current facility-administered medications on file prior to visit.    No Known Allergies  Family History  Problem Relation Age of Onset   Hypertension Other    Breast cancer Mother     Heart disease Father    Hypertrophic cardiomyopathy Sister    Colon cancer Neg Hx    Stomach cancer Neg Hx    Cancer Neg Hx        Colon and Prostate   Hyperparathyroidism Neg Hx    Colon polyps Neg Hx    Esophageal cancer Neg Hx    Rectal cancer Neg Hx     BP (!) 184/80 (BP Location: Right Arm, Patient Position: Sitting, Cuff Size: Normal)   Pulse 67   Ht _0  (1.854 m)   Wt 204 lb 9.6 oz (92.8 kg)   SpO2 98%   BMI 26.99 kg/m    Review of Systems     Objective:   Physical Exam Pulses: dorsalis pedis intact bilat.   MSK: no deformity of the feet CV: 1+ leg edema Skin:  no ulcer on the feet.  normal color and temp on the feet. Neuro: sensation is intact to touch on the feet.   Lab Results  Component Value Date   CREATININE 0.88 03/27/2020   BUN 21 03/27/2020   NA 139 03/27/2020   K 4.7 03/27/2020   CL 101 03/27/2020   CO2 32 03/27/2020    Lab Results  Component Value Date   HGBA1C 6.1 (A) 12/23/2020      Assessment & Plan:  Type 2 DM: well-controlled Hyperparathyroidism: recheck today.    Patient Instructions  Your blood pressure is high today.  Please see Dr Marlou Porch as scheduled, to have it rechecked.   Please continue the same metformin.   Blood tests are requested for you today.  We'll let you know about the results.   Please come back for a follow-up appointment in 6 months.

## 2020-12-23 NOTE — Patient Instructions (Addendum)
Your blood pressure is high today.  Please see Dr Marlou Porch as scheduled, to have it rechecked.   Please continue the same metformin.   Blood tests are requested for you today.  We'll let you know about the results.   Please come back for a follow-up appointment in 6 months.

## 2020-12-24 LAB — PTH, INTACT AND CALCIUM
Calcium: 9.6 mg/dL (ref 8.6–10.3)
PTH: 37 pg/mL (ref 16–77)

## 2020-12-26 ENCOUNTER — Encounter: Payer: Self-pay | Admitting: Internal Medicine

## 2021-01-03 ENCOUNTER — Encounter: Payer: Self-pay | Admitting: Internal Medicine

## 2021-01-03 DIAGNOSIS — Z23 Encounter for immunization: Secondary | ICD-10-CM | POA: Diagnosis not present

## 2021-01-30 DIAGNOSIS — Z8616 Personal history of COVID-19: Secondary | ICD-10-CM

## 2021-01-30 HISTORY — DX: Personal history of COVID-19: Z86.16

## 2021-02-10 ENCOUNTER — Encounter: Payer: Self-pay | Admitting: Cardiology

## 2021-02-10 ENCOUNTER — Other Ambulatory Visit: Payer: Self-pay

## 2021-02-10 ENCOUNTER — Ambulatory Visit: Payer: Medicare HMO

## 2021-02-10 ENCOUNTER — Other Ambulatory Visit: Payer: Self-pay | Admitting: Cardiology

## 2021-02-10 ENCOUNTER — Ambulatory Visit: Payer: Medicare HMO | Admitting: Cardiology

## 2021-02-10 DIAGNOSIS — I10 Essential (primary) hypertension: Secondary | ICD-10-CM | POA: Diagnosis not present

## 2021-02-10 DIAGNOSIS — I4510 Unspecified right bundle-branch block: Secondary | ICD-10-CM | POA: Diagnosis not present

## 2021-02-10 DIAGNOSIS — I452 Bifascicular block: Secondary | ICD-10-CM

## 2021-02-10 DIAGNOSIS — E118 Type 2 diabetes mellitus with unspecified complications: Secondary | ICD-10-CM | POA: Diagnosis not present

## 2021-02-10 DIAGNOSIS — I422 Other hypertrophic cardiomyopathy: Secondary | ICD-10-CM

## 2021-02-10 MED ORDER — METOPROLOL SUCCINATE ER 50 MG PO TB24: ORAL_TABLET | ORAL | 3 refills | Status: DC

## 2021-02-10 MED ORDER — AMLODIPINE BESYLATE 10 MG PO TABS: 10.0000 mg | ORAL_TABLET | Freq: Every day | ORAL | 3 refills | Status: DC

## 2021-02-10 NOTE — Progress Notes (Unsigned)
Enrolled for [employer] to mail a ZIO XT long term holter monitor to the patients address on file.  

## 2021-02-10 NOTE — Assessment & Plan Note (Addendum)
-  Previously normal EF wall thickness posteriorly increased to 12.9 mm, septal wall thickness was 17 mm.  Does not have any high risk factor categories, see below.  His sister was diagnosed with this.  Many family members have been checked.   Risk factors such as wall thickness greater than 30 mm portend high risk, unexplained syncope, first-degree relatives with sudden death, obstructive evidence on echocardiogram, sustained arrhythmias.  None of these were discovered in him.  We will check a ZIO monitor given his hypertrophic cardiomyopathy to ensure that he does not have any adverse arrhythmias.

## 2021-02-10 NOTE — Progress Notes (Signed)
Cardiology Office Note:    Date:  02/10/2021   ID:  Patrick Floyd, DOB 1946/01/24, MRN [id number]  PCP:  Patrick Lima, MD  [facility] Cardiologist:  Patrick Furbish, MD  [facility] Electrophysiologist:  None   Referring MD: Patrick Lima, MD     History of Present Illness:    Patrick Floyd is a 75 y.o. male here for the follow-up of hypertrophic cardiomyopathy, right bundle branch block/left anterior fascicular block/bifascicular block.    Sister has a diagnosis of this as well, diagnosed in [place].  235 pounds down   Now on Medicare.   No chest pain no fevers no chills no syncope no bleeding.  Has bifascicular block.  No change on ECG.  In the past we discussed ACE inhibitor or angiotensin receptor blocker in the setting of diabetes.  He felt poor on these medications. Denies any fevers chills nausea vomiting syncope dangerous arrhythmias.  Takes Toprol.  Past Medical History:  Diagnosis Date   Allergy    seasonal allergies   Anal fissure    Anxiety    hx of   Basal cell carcinoma    Complication of anesthesia    PONV   Diabetes mellitus without complication ([place])    on meds   Diverticulosis of colon    GERD (gastroesophageal reflux disease)    with certain foods-hx of   Headache(784.0)    Hemorrhoids, internal    Hyperlipidemia    on meds   Hyperparathyroidism ([place]) 01/2016   Hyperplastic colonic polyp    Hypertension    on meds   IBS (irritable bowel syndrome)    PONV (postoperative nausea and vomiting)    Seasonal allergies    Thyroid disease    parathyroidectomy   Ulcerative colitis     Past Surgical History:  Procedure Laterality Date   COLONOSCOPY  last 12/24/2015   MS-MAC-pre good-TICS/TA x 2-3 yr recall   MASS EXCISION Left 02/12/2017   Procedure: EXCISION 6x6 CM BACK MASS;  Surgeon: Patrick Hausen, MD;  Location: [facility];  Service: General;  Laterality: Left;   PARATHYROIDECTOMY     POLYPECTOMY  2017   TA x 2    Current  Medications: Current Meds  Medication Sig   Mesalamine (ASACOL) 400 MG CPDR DR capsule Take 1 capsule (400 mg total) by mouth 3 (three) times daily.   metFORMIN (GLUCOPHAGE-XR) 500 MG 24 hr tablet TAKE 2 TABLETS DAILY WITH BREAKFAST   VITAMIN D PO Take by mouth.   [DISCONTINUED] amLODipine (NORVASC) 10 MG tablet TAKE 1 TABLET DAILY   [DISCONTINUED] metoprolol succinate (TOPROL-XL) 50 MG 24 hr tablet TAKE 1 TABLET DAILY WITH OR IMMEDIATELY FOLLOWING A MEAL     Allergies:   Patient has no known allergies.   Social History   Socioeconomic History   Marital status: Married    Spouse name: Not on file   Number of children: Not on file   Years of education: Not on file   Highest education level: Not on file  Occupational History   Not on file  Tobacco Use   Smoking status: Former    Types: Cigarettes    Quit date: 12/05/1979    Years since quitting: 41.2   Smokeless tobacco: Never  Vaping Use   Vaping Use: Never used  Substance and Sexual Activity   Alcohol use: Not Currently    Comment: rare   Drug use: No   Sexual activity: Not on file  Other Topics  Concern   Not on file  Social History Narrative   Forensic psychologist, 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 Marriage is in good health. Work is usual amount of stress.    Social Determinants of Health   Financial Resource Strain: Not on file  Food Insecurity: Not on file  Transportation Needs: Not on file  Physical Activity: Not on file  Stress: Not on file  Social Connections: Not on file     Family History: The patient's family history includes Breast cancer in his mother; Heart disease in his father; Hypertension in an other family member; Hypertrophic cardiomyopathy in his sister. There is no history of Colon cancer, Stomach cancer, Cancer, Hyperparathyroidism, Colon polyps,  Esophageal cancer, or Rectal cancer.  ROS:   Please see the history of present illness.    No fevers chills nausea vomiting syncope bleeding all other systems reviewed and are negative.  EKGs/Labs/Other Studies Reviewed:    The following studies were reviewed today:  ECHO 2018: - Left ventricle: The cavity size was normal. Wall thickness was    increased in a pattern of severe LVH. There was focal basal    hypertrophy. Systolic function was normal. The estimated ejection    fraction was in the range of 60% to 65%. Wall motion was normal;    there were no regional wall motion abnormalities. Features are    consistent with a pseudonormal left ventricular filling pattern,    with concomitant abnormal relaxation and increased filling    pressure (grade 2 diastolic dysfunction).  - Aortic valve: There was trivial regurgitation.   ETT 2019: There was no ST segment deviation noted during stress.  No adverse arrhythmias.   EKG:  EKG is  ordered today.  02/10/2021-sinus rhythm 62 left anterior fascicular block right bundle branch block bifascicular block, prior sinus rhythm 60 right bundle branch block left interventricular block bifascicular block  Recent Labs: 03/27/2020: ALT 18; BUN 21; Creatinine, Ser 0.88; Hemoglobin 14.5; Platelets 248.0; Potassium 4.7; Sodium 139; TSH 2.36  Recent Lipid Panel    Component Value Date/Time   CHOL 177 12/23/2020 0815   TRIG 70.0 12/23/2020 0815   HDL 43.30 12/23/2020 0815   CHOLHDL 4 12/23/2020 0815   VLDL 14.0 12/23/2020 0815   LDLCALC 120 (H) 12/23/2020 0815   LDLDIRECT 166.7 08/24/2012 1218      Physical Exam:    VS:  BP (!) 160/80 (BP Location: Left Arm, Patient Position: Sitting, Cuff Size: Normal)   Pulse 62   Ht _0  (1.854 m)   Wt 203 lb (92.1 kg)   BMI 26.78 kg/m     Wt Readings from Last 3 Encounters:  02/10/21 203 lb (92.1 kg)  12/23/20 204 lb 9.6 oz (92.8 kg)  06/13/20 211 lb (95.7 kg)     GEN: Well nourished, well  developed, in no acute distress HEENT: normal Neck: no JVD, carotid bruits, or masses Cardiac: RRR; no murmurs, rubs, or gallops,no edema  Respiratory:  clear to auscultation bilaterally, normal work of breathing GI: soft, nontender, nondistended, + BS MS: no deformity or atrophy Skin: warm and dry, no rash Neuro:  Alert and Oriented x 3, Strength and sensation are intact Psych: euthymic mood, full affect   ASSESSMENT:    1. Hypertrophic cardiomyopathy ([place])   2. Right bundle branch block   3. Essential hypertension   4.  Type II diabetes mellitus with manifestations (HCC)     PLAN:    In order of problems listed above:  Hypertrophic cardiomyopathy ([place]) -Previously normal EF wall thickness posteriorly increased to 12.9 mm, septal wall thickness was 17 mm.  Does not have any high risk factor categories, see below.  His sister was diagnosed with this.  Many family members have been checked.   Risk factors such as wall thickness greater than 30 mm portend high risk, unexplained syncope, first-degree relatives with sudden death, obstructive evidence on echocardiogram, sustained arrhythmias.  None of these were discovered in him.  We will check a ZIO monitor given his hypertrophic cardiomyopathy to ensure that he does not have any adverse arrhythmias.  Right bundle branch block Has bifascicular block.  No syncope.  May need pacemaker in the future.  Overall stable currently.  Essential hypertension Continue to monitor closely.  He previously felt poor on losartan.  Currently on Toprol-XL 50 mg a day.  Also felt poor on irbesartan 75 mg a day.  Blood pressure currently 160/80.  He is currently on amlodipine 10 mg a day.  Type II diabetes mellitus with manifestations ([place]) Continuing good overall control hemoglobin A1c 6.1.  Pure hypercholesterolemia LDL 120 triglycerides 70.  His wife is currently having a coronary calcium score.  It may not be a bad idea for him to also obtain 1  of these tests.  He will think about this.  He has been resistant in the past to statin therapy.  Shared Decision Making/Informed Consent        Medication Adjustments/Labs and Tests Ordered: Current medicines are reviewed at length with the patient today.  Concerns regarding medicines are outlined above.  Orders Placed This Encounter  Procedures   LONG TERM MONITOR (3-14 DAYS)   EKG 12-Lead    Meds ordered this encounter  Medications   metoprolol succinate (TOPROL-XL) 50 MG 24 hr tablet    Sig: Take with or immediately following a meal.    Dispense:  90 tablet    Refill:  3   amLODipine (NORVASC) 10 MG tablet    Sig: Take 1 tablet (10 mg total) by mouth daily.    Dispense:  90 tablet    Refill:  3     Patient Instructions  Medication Instructions:  The current medical regimen is effective;  continue present plan and medications.  *If you need a refill on your cardiac medications before your next appointment, please call your pharmacy*  Testing/Procedures: [place] Monitor Instructions  Your physician has requested you wear a ZIO patch monitor for 14 days.  This is a single patch monitor. [employer] supplies one patch monitor per enrollment. Additional stickers are not available. Please do not apply patch if you will be having a Nuclear Stress Test,  Echocardiogram, Cardiac CT, MRI, or Chest Xray during the period you would be wearing the  monitor. The patch cannot be worn during these tests. You cannot remove and re-apply the  ZIO XT patch monitor.  Your ZIO patch monitor will be mailed 3 day USPS to your address on file. It may take 3-5 days  to receive your monitor after you have been enrolled.  Once you have received your monitor, please review the enclosed instructions. Your monitor  has already been registered assigning a specific monitor serial # to you.  Billing and Patient Assistance Program Information  We have supplied [employer] with any of your  insurance information on file for billing  purposes. [employer] offers a sliding scale Patient Assistance Program for patients that do not have  insurance, or whose insurance does not completely cover the cost of the ZIO monitor.  You must apply for the Patient Assistance Program to qualify for this discounted rate.  To apply, please call [employer] at [phone], select option 4, select option 2, ask to apply for  Patient Assistance Program. Theodore Demark will ask your household income, and how many people  are in your household. They will quote your out-of-pocket cost based on that information.  [employer] will also be able to set up a 48-month interest-free payment plan if needed.  Applying the monitor   Shave hair from upper left chest.  Hold abrader disc by orange tab. Rub abrader in 40 strokes over the upper left chest as  indicated in your monitor instructions.  Clean area with 4 enclosed alcohol pads. Let dry.  Apply patch as indicated in monitor instructions. Patch will be placed under collarbone on left  side of chest with arrow pointing upward.  Rub patch adhesive wings for 2 minutes. Remove white label marked "1". Remove the white  label marked "2". Rub patch adhesive wings for 2 additional minutes.  While looking in a mirror, press and release button in center of patch. A small green light will  flash 3-4 times. This will be your only indicator that the monitor has been turned on.  Do not shower for the first 24 hours. You may shower after the first 24 hours.  Press the button if you feel a symptom. You will hear a small click. Record Date, Time and  Symptom in the Patient Logbook.  When you are ready to remove the patch, follow instructions on the last 2 pages of Patient  Logbook. Stick patch monitor onto the last page of Patient Logbook.  Place Patient Logbook in the blue and white box. Use locking tab on box and tape box closed  securely. The blue and white box has prepaid postage on  it. Please place it in the mailbox as  soon as possible. Your physician should have your test results approximately 7 days after the  monitor has [facility]  Call I[place]at [phone] you have questions regarding  your ZIO XT patch monitor. Call them immediately if you see an orange light blinking on your  monitor.  If your monitor falls off in less than 4 days, contact our Monitor department at [phone]  If your monitor becomes loose or falls off after 4 days call [employer] at [phone]  suggestions on securing your monitor  Follow-Up: [facility] you and your health needs are our priority.  As part of our continuing mission to provide you with exceptional heart care, we have created designated Provider Care Teams.  These Care Teams include your primary Cardiologist (physician) and Advanced Practice Providers (APPs -  Physician Assistants and Nurse Practitioners) who all work together to provide you with the care you need, when you need it.  We recommend signing up for the patient portal called "MyChart".  Sign up information is provided on this After Visit Summary.  MyChart is used to connect with patients for Virtual Visits (Telemedicine).  Patients are able to view lab/test results, encounter notes, upcoming appointments, etc.  Non-urgent messages can be sent to your provider as well.   To learn more about what you can do with MyChart, go to [email or web address]    Your next appointment:  1 year(s)  The format for your next appointment:   In Person  Provider:   Candee Furbish, MD     Thank you for choosing [facility]!!     Signed, Patrick Furbish, MD  02/10/2021 11:40 AM    [place]

## 2021-02-10 NOTE — Assessment & Plan Note (Signed)
Continue to monitor closely.  He previously felt poor on losartan.  Currently on Toprol-XL 50 mg a day.  Also felt poor on irbesartan 75 mg a day.  Blood pressure currently 160/80.  He is currently on amlodipine 10 mg a day.

## 2021-02-10 NOTE — Assessment & Plan Note (Signed)
LDL 120 triglycerides 70.  His wife is currently having a coronary calcium score.  It may not be a bad idea for him to also obtain 1 of these tests.  He will think about this.  He has been resistant in the past to statin therapy.

## 2021-02-10 NOTE — Assessment & Plan Note (Signed)
Continuing good overall control hemoglobin A1c 6.1.

## 2021-02-10 NOTE — Patient Instructions (Signed)
Medication Instructions:  The current medical regimen is effective;  continue present plan and medications.  *If you need a refill on your cardiac medications before your next appointment, please call your pharmacy*  Testing/Procedures: [place] Monitor Instructions  Your physician has requested you wear a ZIO patch monitor for 14 days.  This is a single patch monitor. [employer] supplies one patch monitor per enrollment. Additional stickers are not available. Please do not apply patch if you will be having a Nuclear Stress Test,  Echocardiogram, Cardiac CT, MRI, or Chest Xray during the period you would be wearing the  monitor. The patch cannot be worn during these tests. You cannot remove and re-apply the  ZIO XT patch monitor.  Your ZIO patch monitor will be mailed 3 day USPS to your address on file. It may take 3-5 days  to receive your monitor after you have been enrolled.  Once you have received your monitor, please review the enclosed instructions. Your monitor  has already been registered assigning a specific monitor serial # to you.  Billing and Patient Assistance Program Information  We have supplied [employer] with any of your insurance information on file for billing purposes. [employer] offers a sliding scale Patient Assistance Program for patients that do not have  insurance, or whose insurance does not completely cover the cost of the ZIO monitor.  You must apply for the Patient Assistance Program to qualify for this discounted rate.  To apply, please call [employer] at [phone], select option 4, select option 2, ask to apply for  Patient Assistance Program. Theodore Demark will ask your household income, and how many people  are in your household. They will quote your out-of-pocket cost based on that information.  [employer] will also be able to set up a 24-month interest-free payment plan if needed.  Applying the monitor   Shave hair from upper left chest.  Hold abrader disc  by orange tab. Rub abrader in 40 strokes over the upper left chest as  indicated in your monitor instructions.  Clean area with 4 enclosed alcohol pads. Let dry.  Apply patch as indicated in monitor instructions. Patch will be placed under collarbone on left  side of chest with arrow pointing upward.  Rub patch adhesive wings for 2 minutes. Remove white label marked "1". Remove the white  label marked "2". Rub patch adhesive wings for 2 additional minutes.  While looking in a mirror, press and release button in center of patch. A small green light will  flash 3-4 times. This will be your only indicator that the monitor has been turned on.  Do not shower for the first 24 hours. You may shower after the first 24 hours.  Press the button if you feel a symptom. You will hear a small click. Record Date, Time and  Symptom in the Patient Logbook.  When you are ready to remove the patch, follow instructions on the last 2 pages of Patient  Logbook. Stick patch monitor onto the last page of Patient Logbook.  Place Patient Logbook in the blue and white box. Use locking tab on box and tape box closed  securely. The blue and white box has prepaid postage on it. Please place it in the mailbox as  soon as possible. Your physician should have your test results approximately 7 days after the  monitor has been [facility]  Call I[place]at [phone] you have questions regarding  your ZIO XT patch  monitor. Call them immediately if you see an orange light blinking on your  monitor.  If your monitor falls off in less than 4 days, contact our Monitor department at [phone].  If your monitor becomes loose or falls off after 4 days call [employer] at [phone] for  suggestions on securing your monitor  Follow-Up: At [facility], you and your health needs are our priority.  As part of our continuing mission to provide you with exceptional heart care, we have  created designated Provider Care Teams.  These Care Teams include your primary Cardiologist (physician) and Advanced Practice Providers (APPs -  Physician Assistants and Nurse Practitioners) who all work together to provide you with the care you need, when you need it.  We recommend signing up for the patient portal called "MyChart".  Sign up information is provided on this After Visit Summary.  MyChart is used to connect with patients for Virtual Visits (Telemedicine).  Patients are able to view lab/test results, encounter notes, upcoming appointments, etc.  Non-urgent messages can be sent to your provider as well.   To learn more about what you can do with MyChart, go to [email or web address].    Your next appointment:   1 year(s)  The format for your next appointment:   In Person  Provider:   Candee Furbish, MD     Thank you for choosing [facility]!!

## 2021-02-10 NOTE — Assessment & Plan Note (Signed)
Has bifascicular block.  No syncope.  May need pacemaker in the future.  Overall stable currently.

## 2021-02-11 ENCOUNTER — Encounter: Payer: Self-pay | Admitting: Endocrinology

## 2021-02-11 MED ORDER — METFORMIN HCL ER 500 MG PO TB24: ORAL_TABLET | ORAL | 3 refills | Status: DC

## 2021-02-24 ENCOUNTER — Encounter: Payer: Self-pay | Admitting: Internal Medicine

## 2021-02-24 ENCOUNTER — Telehealth: Payer: Medicare HMO | Admitting: Nurse Practitioner

## 2021-02-24 ENCOUNTER — Encounter: Payer: Self-pay | Admitting: Endocrinology

## 2021-02-24 DIAGNOSIS — U071 COVID-19: Secondary | ICD-10-CM | POA: Diagnosis not present

## 2021-02-24 MED ORDER — MOLNUPIRAVIR EUA 200MG CAPSULE: 4.0000 | ORAL_CAPSULE | Freq: Two times a day (BID) | ORAL | 0 refills | Status: AC

## 2021-02-24 NOTE — Progress Notes (Signed)
Virtual Visit Consent   Patrick Floyd, you are scheduled for a virtual visit with a [facility] provider today.     Just as with appointments in the office, your consent must be obtained to participate.  Your consent will be active for this visit and any virtual visit you may have with one of our providers in the next 365 days.     If you have a MyChart account, a copy of this consent can be sent to you electronically.  All virtual visits are billed to your insurance company just like a traditional visit in the office.    As this is a virtual visit, video technology does not allow for your provider to perform a traditional examination.  This may limit your provider's ability to fully assess your condition.  If your provider identifies any concerns that need to be evaluated in person or the need to arrange testing (such as labs, EKG, etc.), we will make arrangements to do so.     Although advances in technology are sophisticated, we cannot ensure that it will always work on either your end or our end.  If the connection with a video visit is poor, the visit may have to be switched to a telephone visit.  With either a video or telephone visit, we are not always able to ensure that we have a secure connection.     I need to obtain your verbal consent now.   Are you willing to proceed with your visit today?    Patrick Floyd has provided verbal consent on 02/24/2021 for a virtual visit (video or telephone).   Apolonio Schneiders, FNP   Date: 02/24/2021 11:59 AM   Virtual Visit via Video Note   I, Apolonio Schneiders, connected with  Patrick Floyd  ([id number], [birth year]) on 02/24/21 at 12:30 PM EST by a video-enabled telemedicine application and verified that I am speaking with the correct person using two identifiers.  Location: Patient: Virtual Visit Location Patient: Home Provider: Virtual Visit Location Provider: Home Office   I discussed the limitations of evaluation and management by  telemedicine and the availability of in person appointments. The patient expressed understanding and agreed to proceed.    History of Present Illness: Patrick Floyd is a 75 y.o. who identifies as a male who was assigned male at birth, and is being seen today after testing positive for COVID at home yesterday.   Yesterday he had a sore throat and a cough  Overnight he started to develop a headache  He has more nasal congestion today as well   He has not had COVID in the past  He has been vaccinated x5 for COVID   GFR 86 this year, denies a problem with kidneys in the past    Problems:  Patient Active Problem List   Diagnosis Date Noted   Hypertrophic cardiomyopathy ([place]) 02/10/2021   Encounter for general adult medical examination with abnormal findings 03/27/2020   Need for shingles vaccine 03/27/2020   Hyperparathyroidism ([place]) 09/28/2018   Right bundle branch block 03/12/2016   Vitamin D deficiency 02/18/2016   Alkaline phosphatase elevation 02/17/2016   Benign prostatic hyperplasia without lower urinary tract symptoms 02/17/2016   Nonspecific abnormal electrocardiogram (ECG) (EKG) 02/17/2016   Pure hypercholesterolemia 02/12/2016   Routine health maintenance 12/15/2010   OBESITY, CLASS I 11/13/2009   Type II diabetes mellitus with manifestations ([place]) 09/14/2008   Hyperlipidemia with target LDL less than 100 06/20/2007   Essential hypertension 06/20/2007  Ulcerative colitis ([place]) 06/20/2007    Allergies: No Known Allergies Medications:  Current Outpatient Medications:    amLODipine (NORVASC) 10 MG tablet, Take 1 tablet (10 mg total) by mouth daily., Disp: 90 tablet, Rfl: 3   Mesalamine (ASACOL) 400 MG CPDR DR capsule, Take 1 capsule (400 mg total) by mouth 3 (three) times daily., Disp: 180 capsule, Rfl: 6   metFORMIN (GLUCOPHAGE-XR) 500 MG 24 hr tablet, TAKE 2 TABLETS DAILY WITH BREAKFAST, Disp: 180 tablet, Rfl: 3   metoprolol succinate (TOPROL-XL) 50 MG 24 hr tablet,  Take with or immediately following a meal., Disp: 90 tablet, Rfl: 3   VITAMIN D PO, Take by mouth., Disp: , Rfl:   Observations/Objective: Patient is well-developed, well-nourished in no acute distress.  Resting comfortably at home.  Head is normocephalic, atraumatic.  No labored breathing.  Speech is clear and coherent with logical content.  Patient is alert and oriented at baseline.    Assessment and Plan: 1. COVID-19 Take anti-viral with food.   - molnupiravir EUA (LAGEVRIO) 200 mg CAPS capsule; Take 4 capsules (800 mg total) by mouth 2 (two) times daily for 5 days.  Dispense: 40 capsule; Refill: 0    May use Coricidin HBP OTC for congestion and cough relief  Push fluids rest Follow up with new or worsening symptoms as discussed  Reviewed quarantine time and safe OTC medication use   Follow Up Instructions: I discussed the assessment and treatment plan with the patient. The patient was provided an opportunity to ask questions and all were answered. The patient agreed with the plan and demonstrated an understanding of the instructions.  A copy of instructions were sent to the patient via MyChart unless otherwise noted below.     The patient was advised to call back or seek an in-person evaluation if the symptoms worsen or if the condition fails to improve as anticipated.  Time:  I spent 15 minutes with the patient via telehealth technology discussing the above problems/concerns.    Apolonio Schneiders, FNP

## 2021-02-25 ENCOUNTER — Encounter: Payer: Self-pay | Admitting: Nurse Practitioner

## 2021-02-25 ENCOUNTER — Telehealth: Payer: Self-pay | Admitting: Internal Medicine

## 2021-02-25 NOTE — Telephone Encounter (Signed)
Pt connected to [facility] 12.25.2022.    Caller states he tested positive for COVID today. States he has a sore throat, fever and cough. Symptoms started 2 days ago. Latest temp is 99.0 (oral). No chest pain. No SOB.  Appointment yesterday.

## 2021-03-31 ENCOUNTER — Other Ambulatory Visit: Payer: Self-pay

## 2021-03-31 ENCOUNTER — Ambulatory Visit (INDEPENDENT_AMBULATORY_CARE_PROVIDER_SITE_OTHER): Payer: Medicare HMO | Admitting: Internal Medicine

## 2021-03-31 ENCOUNTER — Encounter: Payer: Self-pay | Admitting: Internal Medicine

## 2021-03-31 VITALS — BP 128/78 | HR 77 | Temp 98.2°F | Ht 73.0 in | Wt 202.0 lb

## 2021-03-31 DIAGNOSIS — I10 Essential (primary) hypertension: Secondary | ICD-10-CM

## 2021-03-31 DIAGNOSIS — E118 Type 2 diabetes mellitus with unspecified complications: Secondary | ICD-10-CM

## 2021-03-31 DIAGNOSIS — N400 Enlarged prostate without lower urinary tract symptoms: Secondary | ICD-10-CM | POA: Diagnosis not present

## 2021-03-31 DIAGNOSIS — Z0000 Encounter for general adult medical examination without abnormal findings: Secondary | ICD-10-CM

## 2021-03-31 DIAGNOSIS — R748 Abnormal levels of other serum enzymes: Secondary | ICD-10-CM | POA: Diagnosis not present

## 2021-03-31 DIAGNOSIS — E785 Hyperlipidemia, unspecified: Secondary | ICD-10-CM | POA: Diagnosis not present

## 2021-03-31 DIAGNOSIS — E213 Hyperparathyroidism, unspecified: Secondary | ICD-10-CM

## 2021-03-31 DIAGNOSIS — Z0001 Encounter for general adult medical examination with abnormal findings: Secondary | ICD-10-CM

## 2021-03-31 DIAGNOSIS — K4090 Unilateral inguinal hernia, without obstruction or gangrene, not specified as recurrent: Secondary | ICD-10-CM | POA: Diagnosis not present

## 2021-03-31 LAB — CBC WITH DIFFERENTIAL/PLATELET
Basophils Absolute: 0.1 10*3/uL (ref 0.0–0.1)
Basophils Relative: 0.6 % (ref 0.0–3.0)
Eosinophils Absolute: 0.2 10*3/uL (ref 0.0–0.7)
Eosinophils Relative: 2.8 % (ref 0.0–5.0)
HCT: 41.7 % (ref 39.0–52.0)
Hemoglobin: 14.1 g/dL (ref 13.0–17.0)
Lymphocytes Relative: 22.5 % (ref 12.0–46.0)
Lymphs Abs: 2 10*3/uL (ref 0.7–4.0)
MCHC: 33.7 g/dL (ref 30.0–36.0)
MCV: 85.3 fl (ref 78.0–100.0)
Monocytes Absolute: 0.6 10*3/uL (ref 0.1–1.0)
Monocytes Relative: 6.9 % (ref 3.0–12.0)
Neutro Abs: 5.9 10*3/uL (ref 1.4–7.7)
Neutrophils Relative %: 67.2 % (ref 43.0–77.0)
Platelets: 252 10*3/uL (ref 150.0–400.0)
RBC: 4.89 Mil/uL (ref 4.22–5.81)
RDW: 14.1 % (ref 11.5–15.5)
WBC: 8.8 10*3/uL (ref 4.0–10.5)

## 2021-03-31 LAB — BASIC METABOLIC PANEL
BUN: 24 mg/dL — ABNORMAL HIGH (ref 6–23)
CO2: 30 mEq/L (ref 19–32)
Calcium: 9.3 mg/dL (ref 8.4–10.5)
Chloride: 98 mEq/L (ref 96–112)
Creatinine, Ser: 0.92 mg/dL (ref 0.40–1.50)
GFR: 82.73 mL/min (ref >60.00)
Glucose, Bld: 149 mg/dL — ABNORMAL HIGH (ref 70–99)
Potassium: 3.6 mEq/L (ref 3.5–5.1)
Sodium: 138 mEq/L (ref 135–145)

## 2021-03-31 LAB — URINALYSIS, ROUTINE W REFLEX MICROSCOPIC
Bilirubin Urine: NEGATIVE
Hgb urine dipstick: NEGATIVE
Ketones, ur: NEGATIVE
Leukocytes,Ua: NEGATIVE
Nitrite: NEGATIVE
Specific Gravity, Urine: 1.01 (ref 1.000–1.030)
Total Protein, Urine: NEGATIVE
Urine Glucose: NEGATIVE
Urobilinogen, UA: 0.2 (ref 0.0–1.0)
pH: 6.5 (ref 5.0–8.0)

## 2021-03-31 LAB — HEMOGLOBIN A1C: Hgb A1c MFr Bld: 6.1 % (ref 4.6–6.5)

## 2021-03-31 LAB — PSA: PSA: 1.18 ng/mL (ref 0.10–4.00)

## 2021-03-31 LAB — TSH: TSH: 2.66 u[IU]/mL (ref 0.35–5.50)

## 2021-03-31 LAB — HEPATIC FUNCTION PANEL
ALT: 12 U/L (ref 0–53)
AST: 13 U/L (ref 0–37)
Albumin: 4.4 g/dL (ref 3.5–5.2)
Alkaline Phosphatase: 55 U/L (ref 39–117)
Bilirubin, Direct: 0.2 mg/dL (ref 0.0–0.3)
Total Bilirubin: 1.2 mg/dL (ref 0.2–1.2)
Total Protein: 7 g/dL (ref 6.0–8.3)

## 2021-03-31 MED ORDER — ROSUVASTATIN CALCIUM 5 MG PO TABS: 5.0000 mg | ORAL_TABLET | Freq: Every day | ORAL | 1 refills | Status: DC

## 2021-03-31 NOTE — Patient Instructions (Signed)

## 2021-03-31 NOTE — Progress Notes (Signed)
Subjective:  Patient ID: Patrick Floyd, male    DOB: 1946-02-10  Age: 75 y.o. MRN: [id number]  CC: Annual Exam, Hyperlipidemia, and Diabetes  This visit occurred during the SARS-CoV-2 public health emergency.  Safety protocols were in place, including screening questions prior to the visit, additional usage of staff PPE, and extensive cleaning of exam room while observing appropriate contact time as indicated for disinfecting solutions.    HPI Patrick Floyd presents for a CPX and f/up -  He complains of a 6-week history of bulge and aching discomfort in his right groin.  He denies nausea, vomiting, loss of appetite, or weight loss.  He is active and denies chest pain, shortness of breath, diaphoresis, dizziness, lightheadedness, or edema.  Outpatient Medications Prior to Visit  Medication Sig Dispense Refill   amLODipine (NORVASC) 10 MG tablet Take 1 tablet (10 mg total) by mouth daily. 90 tablet 3   Mesalamine (ASACOL) 400 MG CPDR DR capsule Take 1 capsule (400 mg total) by mouth 3 (three) times daily. 180 capsule 6   metFORMIN (GLUCOPHAGE-XR) 500 MG 24 hr tablet TAKE 2 TABLETS DAILY WITH BREAKFAST 180 tablet 3   metoprolol succinate (TOPROL-XL) 50 MG 24 hr tablet Take with or immediately following a meal. 90 tablet 3   VITAMIN D PO Take by mouth.     No facility-administered medications prior to visit.    ROS Review of Systems  Constitutional: Negative.  Negative for diaphoresis and fatigue.  HENT: Negative.    Eyes: Negative.   Respiratory:  Negative for cough, chest tightness, shortness of breath and wheezing.   Cardiovascular:  Negative for chest pain, palpitations and leg swelling.  Gastrointestinal:  Negative for abdominal distention, abdominal pain, blood in stool, constipation, diarrhea, nausea and vomiting.  Endocrine: Negative.   Genitourinary: Negative.  Negative for difficulty urinating, scrotal swelling and testicular pain.  Musculoskeletal: Negative.  Negative  for arthralgias and myalgias.  Skin: Negative.  Negative for rash.  Neurological:  Negative for dizziness, weakness, light-headedness and headaches.  Hematological:  Negative for adenopathy. Does not bruise/bleed easily.  Psychiatric/Behavioral: Negative.     Objective:  BP 128/78 (BP Location: Left Arm, Patient Position: Sitting, Cuff Size: Large)    Pulse 77    Temp 98.2 F (36.8 C) (Oral)    Ht 6' 1"  (1.854 m)    Wt 202 lb (91.6 kg)    SpO2 96%    BMI 26.65 kg/m   BP Readings from Last 3 Encounters:  03/31/21 128/78  02/10/21 (!) 160/80  12/23/20 (!) 184/80    Wt Readings from Last 3 Encounters:  03/31/21 202 lb (91.6 kg)  02/10/21 203 lb (92.1 kg)  12/23/20 204 lb 9.6 oz (92.8 kg)    Physical Exam Vitals reviewed.  Constitutional:      Appearance: He is not ill-appearing.  HENT:     Nose: Nose normal.     Mouth/Throat:     Mouth: Mucous membranes are moist.  Eyes:     General: No scleral icterus.    Conjunctiva/sclera: Conjunctivae normal.  Cardiovascular:     Rate and Rhythm: Normal rate and regular rhythm.     Heart sounds: S1 normal and S2 normal. Murmur heard.  Systolic murmur is present with a grade of 1/6.  No diastolic murmur is present.    No gallop.  Pulmonary:     Effort: Pulmonary effort is normal.     Breath sounds: No stridor. No wheezing, rhonchi or rales.  Abdominal:     Hernia: A hernia is present. Hernia is present in the right inguinal area. There is no hernia in the left inguinal area.  Genitourinary:    Pubic Area: No rash.      Penis: Normal and circumcised.      Testes: Normal.     Epididymis:     Right: Normal.     Left: Normal.     Prostate: Enlarged. Not tender and no nodules present.     Rectum: Normal. Guaiac result negative. No mass, tenderness, anal fissure, external hemorrhoid or internal hemorrhoid. Normal anal tone.     Comments: Direct RIH that is present only with valsalva Musculoskeletal:        General: No swelling.      Cervical back: Neck supple.     Right lower leg: No edema.     Left lower leg: No edema.  Lymphadenopathy:     Cervical: No cervical adenopathy.     Lower Body: No right inguinal adenopathy. No left inguinal adenopathy.  Skin:    General: Skin is warm and dry.  Neurological:     General: No focal deficit present.     Mental Status: He is alert.  Psychiatric:        Mood and Affect: Mood normal.        Behavior: Behavior normal.    Lab Results  Component Value Date   WBC 8.8 03/31/2021   HGB 14.1 03/31/2021   HCT 41.7 03/31/2021   PLT 252.0 03/31/2021   GLUCOSE 149 (H) 03/31/2021   CHOL 177 12/23/2020   TRIG 70.0 12/23/2020   HDL 43.30 12/23/2020   LDLDIRECT 166.7 08/24/2012   LDLCALC 120 (H) 12/23/2020   ALT 12 03/31/2021   AST 13 03/31/2021   NA 138 03/31/2021   K 3.6 03/31/2021   CL 98 03/31/2021   CREATININE 0.92 03/31/2021   BUN 24 (H) 03/31/2021   CO2 30 03/31/2021   TSH 2.66 03/31/2021   PSA 1.18 03/31/2021   HGBA1C 6.1 03/31/2021   MICROALBUR 4.5 (H) 03/31/2021    DG Chest 2 View  Result Date: 03/15/2017 CLINICAL DATA:  Cough, shortness of breath, and headache since yesterday. History of hypertension, ulcerative colitis, diabetes, former smoker. EXAM: CHEST  2 VIEW COMPARISON:  None in PACs FINDINGS: The lungs are adequately inflated. There is no focal infiltrate. The interstitial markings are coarse bilaterally. The heart and pulmonary vascularity are normal. The mediastinum is normal in width. There is faint calcification in the wall of the aortic arch. The bony thorax is unremarkable. IMPRESSION: Mild chronic bronchitic changes.  No alveolar pneumonia nor CHF. Thoracic aortic atherosclerosis. Electronically Signed   By: David  Martinique M.D.   On: 03/15/2017 11:26    Assessment & Plan:   Thoma was seen today for annual exam, hyperlipidemia and diabetes.  Diagnoses and all orders for this visit:  Essential hypertension- His BP is well controlled. -      Basic metabolic panel; Future -     TSH; Future -     CBC with Differential/Platelet; Future -     CBC with Differential/Platelet -     TSH -     Basic metabolic panel  Type II diabetes mellitus with manifestations ([place])- His blood sugar is well controlled. -     Basic metabolic panel; Future -     Microalbumin / creatinine urine ratio; Future -     HM Diabetes Foot Exam -  Microalbumin / creatinine urine ratio -     Basic metabolic panel  Hyperparathyroidism ([place])- His Ca++ is normal. -     Basic metabolic panel; Future -     Basic metabolic panel  Benign prostatic hyperplasia without lower urinary tract symptoms- His PSA is normal. -     Urinalysis, Routine w reflex microscopic; Future -     PSA; Future -     PSA -     Urinalysis, Routine w reflex microscopic  Alkaline phosphatase elevation- AP is normal now. -     Hepatic function panel; Future -     Hepatic function panel  Encounter for general adult medical examination with abnormal findings- Exam completed, labs reviewed, vaccines reviewed, cancer screenings are UTD, pt ed material was given.  Hyperlipidemia with target LDL less than 100- LDL goal achieved. Doing well on the statin  -     TSH; Future -     Hemoglobin A1c; Future -     rosuvastatin (CRESTOR) 5 MG tablet; Take 1 tablet (5 mg total) by mouth daily. -     Hemoglobin A1c -     TSH  Inguinal hernia of right side without obstruction or gangrene -     Cancel: Ambulatory referral to General Surgery -     Ambulatory referral to General Surgery   I am having [name]. Fake start on rosuvastatin. I am also having him maintain his VITAMIN D PO, Mesalamine, metoprolol succinate, amLODipine, and metFORMIN.  Meds ordered this encounter  Medications   rosuvastatin (CRESTOR) 5 MG tablet    Sig: Take 1 tablet (5 mg total) by mouth daily.    Dispense:  90 tablet    Refill:  1     Follow-up: Return in about 6 months (around 09/28/2021).  Scarlette Calico,  MD

## 2021-04-01 LAB — MICROALBUMIN / CREATININE URINE RATIO
Creatinine,U: 70.5 mg/dL
Microalb Creat Ratio: 6.3 mg/g (ref 0.0–30.0)
Microalb, Ur: 4.5 mg/dL — ABNORMAL HIGH (ref 0.0–1.9)

## 2021-04-02 ENCOUNTER — Telehealth: Payer: Self-pay | Admitting: Internal Medicine

## 2021-04-02 NOTE — Telephone Encounter (Signed)
Patient calling in  Patient wants to know if there can be a rush put on his referral that was submitted to General Surgery.. says his hernia is bothering him more today then previously & he doesn't want to wait a long time before being seen  Also wants to know if he has any limit restrictions as far as daily activities/exercise etc  Please call [phone]

## 2021-04-04 ENCOUNTER — Encounter: Payer: Self-pay | Admitting: Internal Medicine

## 2021-04-22 ENCOUNTER — Telehealth: Payer: Self-pay | Admitting: *Deleted

## 2021-04-22 ENCOUNTER — Other Ambulatory Visit: Payer: Self-pay | Admitting: *Deleted

## 2021-04-22 ENCOUNTER — Encounter: Payer: Self-pay | Admitting: *Deleted

## 2021-04-22 ENCOUNTER — Ambulatory Visit: Payer: Self-pay | Admitting: Surgery

## 2021-04-22 DIAGNOSIS — K429 Umbilical hernia without obstruction or gangrene: Secondary | ICD-10-CM | POA: Diagnosis not present

## 2021-04-22 DIAGNOSIS — K4020 Bilateral inguinal hernia, without obstruction or gangrene, not specified as recurrent: Secondary | ICD-10-CM | POA: Diagnosis not present

## 2021-04-22 DIAGNOSIS — I452 Bifascicular block: Secondary | ICD-10-CM | POA: Diagnosis not present

## 2021-04-22 NOTE — Telephone Encounter (Signed)
Spoke with pt, he does still have the monitor.  Pt said that he got Covid and then the hernia, and he just didn't feel like wearing it.  Pt aware that this may be a requirement for him in order to receive clearance, and will find out what needs to be done and someone will let him know.

## 2021-04-22 NOTE — Progress Notes (Signed)
Patient ID: Patrick Floyd, male   DOB: 12/07/1945, 76 y.o.   MRN: [id number] ZIO patch monitor has not been returned to [employer] to be processed and enrollment has been changed to status Lost.  [employer] has attempted to contact patient three times to request return of monitor.  Order has been cancelled.

## 2021-04-22 NOTE — Telephone Encounter (Signed)
° °  Name: Patrick Floyd  DOB: 1946-01-23  MRN: [id number]   Primary Cardiologist: Candee Furbish, MD  Chart reviewed as part of pre-operative protocol coverage. Patrick Floyd was last seen 01/2021 by Dr. Marlou Porch, primarily followed for hypertrophic cardiomyopathy and bifascular block. He has not had any high risk factors categories for his HCM in the past per notes. At recent OV, event monitor was ordered. Still says Exam Begun. Will route to callback pool/monitor team to help [place] find out whether the patient wore this or whether we are still waiting on the result from the monitor team.  [name], PA-C 04/22/2021, 2:25 PM

## 2021-04-22 NOTE — Telephone Encounter (Signed)
° °  Pre-operative Risk Assessment    Patient Name: Patrick Floyd  DOB: August 11, 1945 MRN: [id number]      Request for Surgical Clearance    Procedure:   LAPAROSCOPIC BILATERAL INGUINAL HERNIA REPAIRS WITH MESH    Date of Surgery:  Clearance TBD                                 Surgeon:   Surgeon's Group or Practice Name:  [facility] Phone number:  [phone] Fax number:  [phone] ATTN: Illene Regulus   Type of Clearance Requested:   - Medical    Type of Anesthesia:  General    Additional requests/questions:    Astrid Divine   04/22/2021, 10:36 AM

## 2021-04-22 NOTE — Telephone Encounter (Signed)
I will route to Dr. Marlou Porch for input on whether he feels the monitor would be necessary to complete prior to clearing for surgery as requested (bilateral inguinal hernia repairs under general anesthesia) - pt is primarily followed for hx of HOCM with last OV with you 01/2021, recommending event monitor to evaluate for potential arrhythmias. We'll plan to call patient as well to evaluate for symptoms. Dr. Marlou Porch - Please route response to P CV DIV PREOP (the pre-op pool). Thank you.

## 2021-04-25 ENCOUNTER — Encounter: Payer: Self-pay | Admitting: Cardiology

## 2021-04-25 NOTE — Telephone Encounter (Signed)
Dr. Marlou Porch would like this patient to wear a cardiac event monitor prior to being cleared for surgery.  We will defer cardiac clearance until results of cardiac monitor have posted.  Please contact requesting office and let them know that we cannot provide recommendations for clearance at this time.  Thank you.    Jossie Ng. Zoanne Newill NP-C    04/25/2021, 9:04 AM [address] Office [phone] Fax [phone]

## 2021-04-25 NOTE — Telephone Encounter (Signed)
I s/w Katy S. Monitor tech who states the pt has the monitor already, see notes further down in the thread. I will call the pt that he will need to wear the monitor for pre op clearance per Dr. Marlou Porch.    I called the pt to advise that he is going to need to wear the heart monitor per Dr. Marlou Porch in order for pre op clearance. Pt began to raise his voice to me and told me that I was just causing him to be in pain longer if he wears the heart monitor and that he needs his surgery. I stated to the pt that I am not causing him to be in pain, that I am merely following the orders of Dr. Marlou Porch. Pt wanted to know why does he need to wear this monitor. I read to the pt the notes that I have here in front of me from Dr. Marlou Porch and APP's that there is a d/x of HOCM in the past and MD wants to be sure there are no heart arrhythmias as he will be having surgery under general anesthesia.  I explained to the pt that I am trying to help him and that it is never our intent to delay someone's surger. However, it is our goal to be sure that we are protecting the pt in all aspects which includes that their heart is good to go for the surgery/procedure. Pt then did apologize to me. He did ask if he could s/w Dr. Marlou Porch. I did tell the pt that Dr. Marlou Porch is not in the office. He really wants to know where the d/x of HOCM came from. He states that his sister has HOCM not him. I advised this will need to be addressed with the MD. I assured the pt that I will let Dr. Marlou Porch know of our conversation today. Pt said thank you.    Pt and he is agreeable to plan of care he will wear the heart monitor beginning 05/18/2021. Pt states he is going to [place] as his daughter just adopted a baby and they will need to go to court for the adoption in [place].   I will update the pre op provider, Dr. Marlou Porch, and the requesting office (Dr. Michael Boston).

## 2021-04-28 ENCOUNTER — Telehealth: Payer: Self-pay | Admitting: *Deleted

## 2021-04-28 NOTE — Telephone Encounter (Signed)
Spoke with pt regarding the need for cardiac clearance (hernia repair).  Advised per Dr Marlou Porch to wear monitor as ordered.  Scheduled f/u for clearance and to discuss DX of HCM.  Pt is going out of town [facility]) and will not be back until 3/9.  Appt scheduled for 3/14 with Dr Marlou Porch.

## 2021-05-13 ENCOUNTER — Other Ambulatory Visit: Payer: Self-pay

## 2021-05-13 ENCOUNTER — Ambulatory Visit: Payer: Medicare HMO | Admitting: Cardiology

## 2021-05-13 DIAGNOSIS — K4090 Unilateral inguinal hernia, without obstruction or gangrene, not specified as recurrent: Secondary | ICD-10-CM | POA: Diagnosis not present

## 2021-05-13 DIAGNOSIS — Z01810 Encounter for preprocedural cardiovascular examination: Secondary | ICD-10-CM

## 2021-05-13 DIAGNOSIS — I422 Other hypertrophic cardiomyopathy: Secondary | ICD-10-CM | POA: Diagnosis not present

## 2021-05-13 DIAGNOSIS — E118 Type 2 diabetes mellitus with unspecified complications: Secondary | ICD-10-CM | POA: Diagnosis not present

## 2021-05-13 DIAGNOSIS — I4510 Unspecified right bundle-branch block: Secondary | ICD-10-CM

## 2021-05-13 NOTE — Assessment & Plan Note (Signed)
Awaiting surgery. ?

## 2021-05-13 NOTE — Assessment & Plan Note (Signed)
He may proceed with inguinal hernia repair without any further cardiac testing.  He is able to complete greater than 4 METS of activity without difficulty.  No syncope.  No high risk symptoms.  His cardiac MRI can be done after the hernia repair.  This does not stop him from having his surgery.  He has in the past performed an exercise treadmill test which showed no arrhythmias upon exertion. ?

## 2021-05-13 NOTE — Progress Notes (Signed)
?Cardiology Office Note:   ? ?Date:  05/13/2021  ? ?ID:  Patrick Floyd, DOB Oct 30, 1945, MRN [id number] ? ?PCP:  Janith Lima, MD ?  ?[facility] Providers ?Cardiologist:  Candee Furbish, MD    ? ?Referring MD: Janith Lima, MD  ? ? ?History of Present Illness:   ? ?Patrick Floyd is a 76 y.o. male with prior ECHO showing septal wall 17 mm, post wall 13 mm, sister with HOCM, RBBB, DM here for pre op hernia.  ? ?He denies CP, syncope, SOB.  ? ?He was under the impression that unlike his sister who has HCM, he did not carry this diagnosis. His most recent ECHO on the contrary does show phenotypic findings of HCM and this was discussed at a prior visit. Earlier this did not look like it was the case.  ? ?Risk factors such as wall thickness greater than 30 mm portend high risk, unexplained syncope, first-degree relatives with sudden death, obstructive evidence on echocardiogram, sustained arrhythmias.  None of these were discovered in him. ? ?He is able to perform physical activity such as walking 2 miles without any difficulty.  No chest pain no syncope no shortness of breath no fevers chills nausea vomiting. ? ?Past Medical History:  ?Diagnosis Date  ? Allergy   ? seasonal allergies  ? Anal fissure   ? Anxiety   ? hx of  ? Basal cell carcinoma   ? Complication of anesthesia   ? PONV  ? Diabetes mellitus without complication ([place])   ? on meds  ? Diverticulosis of colon   ? GERD (gastroesophageal reflux disease)   ? with certain foods-hx of  ? Headache(784.0)   ? Hemorrhoids, internal   ? Hyperlipidemia   ? on meds  ? Hyperparathyroidism ([place]) 01/2016  ? Hyperplastic colonic polyp   ? Hypertension   ? on meds  ? IBS (irritable bowel syndrome)   ? PONV (postoperative nausea and vomiting)   ? Seasonal allergies   ? Thyroid disease   ? parathyroidectomy  ? Ulcerative colitis   ? ? ?Past Surgical History:  ?Procedure Laterality Date  ? COLONOSCOPY  last 12/24/2015  ? MS-MAC-pre good-TICS/TA x 2-3 yr recall  ? MASS  EXCISION Left 02/12/2017  ? Procedure: EXCISION 6x6 CM BACK MASS;  Surgeon: Johnathan Hausen, MD;  Location: [facility];  Service: General;  Laterality: Left;  ? PARATHYROIDECTOMY    ? POLYPECTOMY  2017  ? TA x 2  ? ? ?Current Medications: ?Current Meds  ?Medication Sig  ? amLODipine (NORVASC) 10 MG tablet Take 1 tablet (10 mg total) by mouth daily.  ? Mesalamine (ASACOL) 400 MG CPDR DR capsule Take 1 capsule (400 mg total) by mouth 3 (three) times daily.  ? metFORMIN (GLUCOPHAGE-XR) 500 MG 24 hr tablet TAKE 2 TABLETS DAILY WITH BREAKFAST  ? metoprolol succinate (TOPROL-XL) 50 MG 24 hr tablet Take with or immediately following a meal.  ? rosuvastatin (CRESTOR) 5 MG tablet Take 1 tablet (5 mg total) by mouth daily.  ? VITAMIN D PO Take by mouth.  ?  ? ?Allergies:   Patient has no known allergies.  ? ?Social History  ? ?Socioeconomic History  ? Marital status: Married  ?  Spouse name: Not on file  ? Number of children: Not on file  ? Years of education: Not on file  ? Highest education level: Not on file  ?Occupational History  ? Not on file  ?Tobacco Use  ? Smoking status: Former  ?  Types: Cigarettes  ?  Quit date: 12/05/1979  ?  Years since quitting: 41.4  ? Smokeless tobacco: Never  ?Vaping Use  ? Vaping Use: Never used  ?Substance and Sexual Activity  ? Alcohol use: Not Currently  ?  Comment: rare  ? Drug use: No  ? Sexual activity: Not on file  ?Other Topics Concern  ? Not on file  ?Social History Narrative  ? Forensic psychologist, 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 Marriage is in good health. Work is usual amount of stress.   ? ?Social Determinants of Health  ? ?Financial Resource Strain: Not on file  ?Food Insecurity: Not on file  ?Transportation Needs: Not on file  ?Physical Activity: Not on file  ?Stress: Not on file  ?Social Connections: Not on file  ?  ? ?Family  History: ?The patient's family history includes Breast cancer in his mother; Heart disease in his father; Hypertension in an other family member; Hypertrophic cardiomyopathy in his sister. There is no history of Colon cancer, Stomach cancer, Cancer, Hyperparathyroidism, Colon polyps, Esophageal cancer, or Rectal cancer. ? ?ROS:   ?Please see the history of present illness.    ? All other systems reviewed and are negative. ? ?EKGs/Labs/Other Studies Reviewed:   ? ?The following studies were reviewed today: ?ECHO 2018: ?- Left ventricle: The cavity size was normal. Wall thickness was  ?  increased in a pattern of severe LVH. There was focal basal  ?  hypertrophy. Systolic function was normal. The estimated ejection  ?  fraction was in the range of 60% to 65%. Wall motion was normal;  ?  there were no regional wall motion abnormalities. Features are  ?  consistent with a pseudonormal left ventricular filling pattern,  ?  with concomitant abnormal relaxation and increased filling  ?  pressure (grade 2 diastolic dysfunction).  ?- Aortic valve: There was trivial regurgitation.  ? ?Stress test 2019: negative ? ?EKG:  EKG is  ordered today.  The ekg ordered today demonstrates sinus rhythm 59 right bundle branch block left anterior fascicular block no change from prior 02/10/21: RBBB, LAFB ? ?Recent Labs: ?03/31/2021: ALT 12; BUN 24; Creatinine, Ser 0.92; Hemoglobin 14.1; Platelets 252.0; Potassium 3.6; Sodium 138; TSH 2.66  ?Recent Lipid Panel ?   ?Component Value Date/Time  ? CHOL 177 12/23/2020 0815  ? TRIG 70.0 12/23/2020 0815  ? HDL 43.30 12/23/2020 0815  ? CHOLHDL 4 12/23/2020 0815  ? VLDL 14.0 12/23/2020 0815  ? [place] 120 (H) 12/23/2020 0815  ? LDLDIRECT 166.7 08/24/2012 1218  ? ? ? ?Risk Assessment/Calculations:   ? ? ?    ? ?   ? ?Physical Exam:   ? ?VS:  BP (!) 150/76 (BP Location: Left Arm)   Pulse (!) 59   Ht _0  (1.854 m)   Wt 200 lb (90.7 kg)   SpO2 99%   BMI 26.39 kg/m?    ? ?Wt Readings from Last 3  Encounters:  ?05/13/21 200 lb (90.7 kg)  ?03/31/21 202 lb (91.6 kg)  ?02/10/21 203 lb (92.1 kg)  ?  ? ?GEN:  Well nourished, well developed in no acute distress ?HEENT: Normal ?NECK: No JVD; No carotid bruits ?LYMPHATICS: No lymphadenopathy ?CARDIAC: RRR, no murmurs, no rubs, gallops ?RESPIRATORY:  Clear to auscultation without rales, wheezing or rhonchi  ?ABDOMEN: Soft, non-tender, non-distended ?MUSCULOSKELETAL:  No edema;  No deformity  ?SKIN: Warm and dry ?NEUROLOGIC:  Alert and oriented x 3 ?PSYCHIATRIC:  Normal affect  ? ?ASSESSMENT:   ? ?1. Hypertrophic cardiomyopathy ([place])   ?2. Right bundle branch block   ?3. Pre-operative cardiovascular examination   ?4. Inguinal hernia of right side without obstruction or gangrene   ?5. Type II diabetes mellitus with manifestations ([place])   ? ?PLAN:   ? ?In order of problems listed above: ? ?Hypertrophic cardiomyopathy ([place]) ?He may proceed with inguinal hernia surgery. ? ?Check cardiac MRI. ?ECHO shows severe basal wall thickness which is compatable with HCM. ?His sister has HCM ?Despite this diagnosis, risk factors such as wall thickness greater than 30 mm portend high risk, unexplained syncope, first-degree relatives with sudden death, obstructive evidence on echocardiogram, sustained arrhythmias.  None of these were discovered in him. ? ?He still has a ZIO monitor from December appointment.  After he is done with his MRI and hernia surgery, I think would be reasonable for him to wear this. ? ? ?Right bundle branch block ?Bifascicular block. May warrant pacer in the future. Stable now, no syncope.  ? ?Pre-operative cardiovascular examination ?He may proceed with inguinal hernia repair without any further cardiac testing.  He is able to complete greater than 4 METS of activity without difficulty.  No syncope.  No high risk symptoms.  His cardiac MRI can be done after the hernia repair.  This does not stop him from having his surgery.  He has in the past performed an  exercise treadmill test which showed no arrhythmias upon exertion. ? ?Inguinal hernia of right side without obstruction or gangrene ?Awaiting surgery. ? ?Type II diabetes mellitus with manifestations ([place]) ?Doing very wel

## 2021-05-13 NOTE — Assessment & Plan Note (Signed)
Doing very well hemoglobin A1c 6.1.  40 pound weight loss. ?

## 2021-05-13 NOTE — Telephone Encounter (Signed)
Will send clearance note as indicated below by Dr. Marlou Porch at office visit with the pt from this morning, to our pre-op pool, for any final notes. ? ? ? ? Assessment & Plan Note by Jerline Pain, MD at 05/13/2021 7:20 AM ? ?Author: Jerline Pain, MD Author Type: Physician Filed: 05/13/2021  8:54 AM  ?Note Status: [place] List: Cosign Not Required Encounter Date: 05/13/2021  ?Problem: Hypertrophic cardiomyopathy ([place])  ?Editor: Jerline Pain, MD (Physician)      ?Prior Versions: 1. Jerline Pain, MD (Physician) at 05/13/2021  8:36 AM - Alison Stalling  ? 2. Jerline Pain, MD (Physician) at 05/13/2021  7:22 AM - Written  ?He may proceed with inguinal hernia surgery. ?  ?Check cardiac MRI. ?ECHO shows severe basal wall thickness which is compatable with HCM. ?His sister has HCM ?Despite this diagnosis, risk factors such as wall thickness greater than 30 mm portend high risk, unexplained syncope, first-degree relatives with sudden death, obstructive evidence on echocardiogram, sustained arrhythmias.  None of these were discovered in him. ?  ?He still has a ZIO monitor from December appointment.  After he is done with his MRI and hernia surgery, I think would be reasonable for him to wear this. ?  ?  ? ? ?

## 2021-05-13 NOTE — Assessment & Plan Note (Addendum)
Bifascicular block. May warrant pacer in the future. Stable now, no syncope.  ?

## 2021-05-13 NOTE — Assessment & Plan Note (Addendum)
He may proceed with inguinal hernia surgery. ? ?Check cardiac MRI. ?ECHO shows severe basal wall thickness which is compatable with HCM. ?His sister has HCM ?Despite this diagnosis, risk factors such as wall thickness greater than 30 mm portend high risk, unexplained syncope, first-degree relatives with sudden death, obstructive evidence on echocardiogram, sustained arrhythmias.  None of these were discovered in him. ? ?He still has a ZIO monitor from December appointment.  After he is done with his MRI and hernia surgery, I think would be reasonable for him to wear this. ? ?

## 2021-05-13 NOTE — Patient Instructions (Addendum)
Medication Instructions:  ? ?Your physician recommends that you continue on your current medications as directed. Please refer to the Current Medication list given to you today. ? ?*If you need a refill on your cardiac medications before your next appointment, please call your pharmacy* ? ? ?Lab Work: ? ?PRIOR TO WHEN YOUR CARDIAC MRI IS SCHEDULED PER PROTOCOL--WILL CHECK BMET AND CBC W DIFF AT THAT TIME. ? ?If you have labs (blood work) drawn today and your tests are completely normal, you will receive your results only by: ?MyChart Message (if you have MyChart) OR ?A paper copy in the mail ?If you have any lab test that is abnormal or we need to change your treatment, we will call you to review the results. ? ? ?Testing/Procedures: ? ?CARDIAC MRI ? ? ?You are scheduled for Cardiac MRI on ______________. Please arrive at the [facility] main entrance of [facility] at ________________ (30-45 minutes prior to test start time). ? ? ?[facility] ?[address] ?[place], [place] [address] ?[phone] ? ?Please take advantage of the free valet parking available at the MAIN entrance (A entrance). Proceed to the [facility] Radiology Department (First Floor). ?? ?Magnetic resonance imaging (MRI) is a painless test that produces images of the inside of the body without using Xrays. ? ?During an MRI, strong magnets and radio waves work together in a Research officer, political party to form detailed images.  ? ?MRI images may provide more details about a medical condition than X-rays, CT scans, and ultrasounds can provide. ? ?You may be given earphones to listen for instructions. ? ?You may eat a light breakfast and take medications as ordered with the exception of HCTZ (fluid pill, other). Please avoid stimulants for 12 hr prior to test. (Ie. Caffeine, nicotine, chocolate, or antihistamine medications) ? ?If a contrast material will be used, an IV will be inserted into one of your veins. Contrast material will be  injected into your IV. It will leave your body through your urine within a day. You may be told to drink plenty of fluids to help flush the contrast material out of your system. ? ?You will be asked to remove all metal, including: Watch, jewelry, and other metal objects including hearing aids, hair pieces and dentures. Also wearable glucose monitoring systems (ie. Freestyle Libre and Omnipods) (Braces and fillings normally are not a problem.) ? ? ?TEST WILL TAKE APPROXIMATELY 1 HOUR ? ?PLEASE NOTIFY SCHEDULING AT LEAST 24 HOURS IN ADVANCE IF YOU ARE UNABLE TO KEEP YOUR APPOINTMENT. ?[phone] ? ?Please call Marchia Bond, cardiac imaging nurse navigator with any questions/concerns. ?Marchia Bond RN Navigator Cardiac Imaging ?Gordy Clement RN Navigator Cardiac Imaging ?[facility] ?[phone] Office  ? ? ? ?Follow-Up: ?At [facility], you and your health needs are our priority.  As part of our continuing mission to provide you with exceptional heart care, we have created designated Provider Care Teams.  These Care Teams include your primary Cardiologist (physician) and Advanced Practice Providers (APPs -  Physician Assistants and Nurse Practitioners) who all work together to provide you with the care you need, when you need it. ? ?We recommend signing up for the patient portal called "MyChart".  Sign up information is provided on this After Visit Summary.  MyChart is used to connect with patients for Virtual Visits (Telemedicine).  Patients are able to view lab/test results, encounter notes, upcoming appointments, etc.  Non-urgent messages can be sent to your provider as well.   ?  To learn more about what you can do with MyChart, go to [email or web address].   ? ?Your next appointment:   ?6 month(s) ? ?The format for your next appointment:   ?In Person ? ?Provider:   ?Candee Furbish, MD { ? ? ?--PER DR. Marlou Porch, YOU MAY PROCEED WITH YOUR HERNIA SURGERY AND WE WILL ENDORSE THIS TO YOUR  SURGEON'S OFFICE AND SEND THEM THE NOTE ?

## 2021-05-15 NOTE — Telephone Encounter (Signed)
? ? ?  Patient Name: Patrick Floyd  ?DOB: 05/03/1945 ?MRN: [id number] ? ?Primary Cardiologist: Candee Furbish, MD ? ?Chart reviewed as part of pre-operative protocol coverage. Patient was seen by Dr. Marlou Porch on 05/13/2021 for pre-op evaluation at which time he was doing well. He was able to perform physical activity such as walking 2 miles without any difficulty. He denied any chest pain or shortness of breath. Per his note, "he may proceed with inguinal hernia surgery."  ? ?Please call with questions. ? ?Darreld Mclean, PA-C ?05/15/2021, 7:27 AM ? ?

## 2021-05-29 ENCOUNTER — Encounter: Payer: Self-pay | Admitting: Gastroenterology

## 2021-05-29 ENCOUNTER — Ambulatory Visit: Payer: Medicare HMO | Admitting: Gastroenterology

## 2021-05-29 VITALS — BP 156/80 | HR 71 | Ht 73.0 in | Wt 203.0 lb

## 2021-05-29 DIAGNOSIS — K5150 Left sided colitis without complications: Secondary | ICD-10-CM | POA: Diagnosis not present

## 2021-05-29 MED ORDER — MESALAMINE 400 MG PO CPDR: 400.0000 mg | DELAYED_RELEASE_CAPSULE | Freq: Three times a day (TID) | ORAL | 11 refills | Status: DC

## 2021-05-29 NOTE — Progress Notes (Signed)
? ? ?  History of Present Illness: This is a 76 year old male returning for follow-up of left-sided ulcerative colitis.  He has no ongoing gastrointestinal complaints.  His last colonoscopy was performed in March 2022 and was without endoscopic or biopsy evidence of colitis.  He has reduced his Asacol to 1.2 g daily on his own. ? ?Current Medications, Allergies, Past Medical History, Past Surgical History, Family History and Social History were reviewed in [employer] record. ? ? ?Physical Exam: ?General: Well developed, well nourished, no acute distress ?Head: Normocephalic and atraumatic ?Eyes: Sclerae anicteric, EOMI ?Ears: Normal auditory acuity ?Mouth: Not examined, mask on during Covid-19 pandemic ?Lungs: Clear throughout to auscultation ?Heart: Regular rate and rhythm; no murmurs, rubs or bruits ?Abdomen: Soft, non tender and non distended. No masses, hepatosplenomegaly or hernias noted. Normal Bowel sounds ?Rectal: Not done ?Musculoskeletal: Symmetrical with no gross deformities  ?Pulses:  Normal pulses noted ?Extremities: No clubbing, cyanosis, edema or deformities noted ?Neurological: Alert oriented x 4, grossly nonfocal ?Psychological:  Alert and cooperative. Normal mood and affect ? ? ?Assessment and Recommendations: ? ?Left sided ulcerative colitis.  Continue Asacol 1.2 g daily per his preference and absence of  colitis activity.  BMET, UA, CBC from March 31, 2021 reviewed. REV in 1 year. ?

## 2021-05-29 NOTE — Patient Instructions (Addendum)
We have sent the following medications to your pharmacy for you to pick up at your convenience: Asacol.  ? ?The [facility] providers would like to encourage you to use [facility] to communicate with providers for non-urgent requests or questions.  Due to long hold times on the telephone, sending your provider a message by [facility] may be a faster and more efficient way to get a response.  Please allow 48 business hours for a response.  Please remember that this is for non-urgent requests.  ? ?Thank you for choosing me and [facility]. ? ?Malcolm T. Dagoberto Ligas., MD., Marval Regal ? ? ? ?

## 2021-06-24 NOTE — Progress Notes (Addendum)
Chart reviewed w/ anesthesia , Dr Malka So, for surgery scheduled 06-27-2021 by Dr Johney Maine since pt has hypertrophic cardiomyopathy.  Pt does have cardiac clearance note in epic dated 05-13-2021 by Dr Marlou Porch.  Dr Kalman Shan MDA stated pt is candidate for [facility] , may proceed.  ? ?Spoke w/ via phone for pre-op interview--- pt ?Lab needs dos----  istat             ?Lab results------  current ekg in epic/ chart ?COVID test -----patient states asymptomatic no test needed ?Arrive at ------- 0730 on 06-27-2021 ?NPO after MN NO Solid Food.  Clear liquids from MN until--- 0630 ?Med rec completed ?Medications to take morning of surgery ----- toprol, norvasc ?Diabetic medication ----- do not take metformin morning of surgery ?Patient instructed no nail polish to be worn day of surgery ?Patient instructed to bring photo id and insurance card day of surgery ?Patient aware to have Driver (ride ) / caregiver  for 24 hours after surgery --wife, linda ?Patient Special Instructions ----- per pt stated no bowel movement since Saturday , this is normal for him,  advised pt to call Dr Johney Maine for recommendation to help w/ bowel's prior to surgery since no pre-op order prep, verbalized understanding ?Pre-Op special Istructions ----- pt had cardiac office note clearance by Dr Marlou Porch dated 05-13-2021 in epic/ chart ?Patient verbalized understanding of instructions that were given at this phone interview. ?Patient denies shortness of breath, chest pain, fever, cough at this phone interview.  ? ? ?Anesthesia Review: HTN;  HOCM;  DM2 ?Pt denies any cardiac s&s and no sob with any activity ? ?Cardiologist : Dr Marlou Porch Cassell Clement 05-13-2021) ?Endocrinologist:  Dr Kelton Pillar Cassell Clement 06-25-2021) ?EKG : 05-13-2021 ?Echo :  03-27-2016 ?Stress test: ETT 01-07-2018/  nuclear 03-20-2016 ?Fasting Blood Sugar :      / Checks Blood Sugar -- times a day:  does not check ?Blood Thinner/ Instructions /Last Dose: no ?ASA / Instructions/ Last Dose :  no ? ? ?

## 2021-06-25 ENCOUNTER — Other Ambulatory Visit: Payer: Self-pay

## 2021-06-25 ENCOUNTER — Ambulatory Visit: Payer: BC Managed Care – PPO | Admitting: Endocrinology

## 2021-06-25 ENCOUNTER — Encounter (HOSPITAL_BASED_OUTPATIENT_CLINIC_OR_DEPARTMENT_OTHER): Payer: Self-pay | Admitting: Surgery

## 2021-06-25 ENCOUNTER — Ambulatory Visit: Payer: Medicare HMO | Admitting: Internal Medicine

## 2021-06-25 ENCOUNTER — Encounter: Payer: Self-pay | Admitting: Internal Medicine

## 2021-06-25 VITALS — BP 146/84 | HR 63 | Ht 73.0 in | Wt 200.0 lb

## 2021-06-25 DIAGNOSIS — E785 Hyperlipidemia, unspecified: Secondary | ICD-10-CM | POA: Insufficient documentation

## 2021-06-25 DIAGNOSIS — E559 Vitamin D deficiency, unspecified: Secondary | ICD-10-CM | POA: Diagnosis not present

## 2021-06-25 DIAGNOSIS — R739 Hyperglycemia, unspecified: Secondary | ICD-10-CM

## 2021-06-25 DIAGNOSIS — E119 Type 2 diabetes mellitus without complications: Secondary | ICD-10-CM

## 2021-06-25 LAB — POCT GLYCOSYLATED HEMOGLOBIN (HGB A1C): Hemoglobin A1C: 6.1 % — AB (ref 4.0–5.6)

## 2021-06-25 LAB — POCT GLUCOSE (DEVICE FOR HOME USE): Glucose Fasting, POC: 124 mg/dL — AB (ref 70–99)

## 2021-06-25 MED ORDER — METFORMIN HCL ER 500 MG PO TB24: 500.0000 mg | ORAL_TABLET | Freq: Every day | ORAL | 3 refills | Status: DC

## 2021-06-25 NOTE — Progress Notes (Signed)
?Name: Patrick Floyd  ?Age/ Sex: 76 y.o., male   ?MRN/ DOB: [id number], 08-30-1945    ? ?PCP: Janith Lima, MD   ?Reason for Endocrinology Evaluation: Type 2 Diabetes Mellitus  ?Initial Endocrine Consultative Visit: 03/23/2016  ? ? ?PATIENT IDENTIFIER: Patrick Floyd is a 76 y.o. male with a past medical history of T2Dm, HTN , UC and dyslipidemia . The patient has followed with Endocrinology clinic since 03/23/2016 for consultative assistance with management of his diabetes. ? ?DIABETIC HISTORY:  ?Mr. Hagey was diagnosed with DM in 2014, he has been on metformin since his diagnosis.  His hemoglobin A1c has ranged from 6.1% in 2023, peaking at 8.6% in 2019. ? ?HYPERPARATHYROID HISTORY: ? ?He was diagnosed with hyperparathyroidism in 2010.  He is status post parathyroidectomy in 2018 ? ?Last DXA 2018-low bone density ? ? ?SUBJECTIVE:  ? ?During the last visit (11/2020): saw Dr. Loanne Drilling A1c was 6.1%, metformin continued ? ?Today (06/25/2021): Mr. Putzier is here for a follow up on diabetes management.  He checks his blood sugars 0 times daily. ? ?Denies nausea, vomiting or diarrhea  ?He is seeing Dr. Marlou Porch for cardiac clearance pending hernia surgery ?He is not consistently taking rosuvastatin ?He is also not consistently taking vitamin D ? ? ? ? ?HOME DIABETES REGIMEN:  ?Metformin 500 mg XR TWO tabs daily  ? ?Statin: not taking crestor  ?ACE-I/ARB: No ? ? ? ?METER DOWNLOAD SUMMARY: did not bring  ? ? ? ? ?DIABETIC COMPLICATIONS: ?Microvascular complications:  ? ?Denies: CKD, neuropathy, retinopathy  ?Last Eye Exam: Completed 06/2020 ? ?Macrovascular complications:  ? ?Denies: CAD, CVA, PVD ? ? ?HISTORY:  ?Past Medical History:  ?Past Medical History:  ?Diagnosis Date  ? Diverticulosis of colon   ? GERD (gastroesophageal reflux disease)   ? watches diet  ? Hemorrhoids, internal   ? History of adenomatous polyp of colon   ? History of anal fissures   ? History of basal cell carcinoma   ? History of COVID-19  01/2021  ? per pt mild symptoms that resolved  ? History of primary hyperparathyroidism 01/2016  ? endocrinologist--- dr Kelton Pillar;  s/p right superior parathyroidectomy 06/ 2018,  adenoma  ? Hyperlipidemia   ? Hypertension   ? Hypertrophic cardiomyopathy ([place])   ? cardiologist--- dr Marlou Porch  ? IBS (irritable bowel syndrome)   ? Inguinal hernia, bilateral   ? Nocturia   ? PONV (postoperative nausea and vomiting)   ? RBBB (right bundle branch block with left anterior fascicular block)   ? Seasonal allergies   ? Type 2 diabetes mellitus ([place])   ? followed by dr Kelton Pillar (endocrinologist);;  (06-25-2021 pt stated does not check blood sugar at home)  ? Ulcerative colitis, left sided ([place])   ? followed by dr stark (GI)  ? Umbilical hernia   ? Wears glasses   ? ?Past Surgical History:  ?Past Surgical History:  ?Procedure Laterality Date  ? COLONOSCOPY WITH PROPOFOL  05/27/2020  ? by dr stark  ? MASS EXCISION Left 02/12/2017  ? Procedure: EXCISION 6x6 CM BACK MASS;  Surgeon: Johnathan Hausen, MD;  Location: [facility];  Service: General;  Laterality: Left;  ? PARATHYROIDECTOMY Right 08/20/2016  ? @[facility] ;  right superior   (adenoma)  ? ?Social History:  reports that he quit smoking about 41 years ago. His smoking use included cigarettes. He has never used smokeless tobacco. He reports that he does not currently use alcohol. He reports that he does not use drugs. ?  Family History:  ?Family History  ?Problem Relation Age of Onset  ? Hypertension Other   ? Breast cancer Mother   ? Heart disease Father   ? Hypertrophic cardiomyopathy Sister   ? Colon cancer Neg Hx   ? Stomach cancer Neg Hx   ? Cancer Neg Hx   ?     Colon and Prostate  ? Hyperparathyroidism Neg Hx   ? Colon polyps Neg Hx   ? Esophageal cancer Neg Hx   ? Rectal cancer Neg Hx   ? ? ? ?HOME MEDICATIONS: ?Allergies as of 06/25/2021   ?No Known Allergies ?  ? ?  ?Medication List  ?  ? ?  ? Accurate as of June 25, 2021  4:39 PM. If you have any questions, ask your nurse or  doctor.  ?  ?  ? ?  ? ?acetaminophen 500 MG tablet ?Commonly known as: TYLENOL ?Take 500 mg by mouth every 6 (six) hours as needed. ?  ?amLODipine 10 MG tablet ?Commonly known as: NORVASC ?Take 1 tablet (10 mg total) by mouth daily. ?  ?Mesalamine 400 MG Cpdr DR capsule ?Commonly known as: ASACOL ?Take 1 capsule (400 mg total) by mouth 3 (three) times daily. ?What changed: when to take this ?  ?metFORMIN 500 MG 24 hr tablet ?Commonly known as: GLUCOPHAGE-XR ?Take 1 tablet (500 mg total) by mouth daily with breakfast. TAKE 2 TABLETS DAILY WITH BREAKFAST ?What changed:  ?how much to take ?how to take this ?when to take this ?Changed by: Dorita Sciara, MD ?  ?metoprolol succinate 50 MG 24 hr tablet ?Commonly known as: TOPROL-XL ?Take with or immediately following a meal. ?What changed:  ?how much to take ?when to take this ?  ?naproxen sodium 220 MG tablet ?Commonly known as: ALEVE ?Take 220 mg by mouth 2 (two) times daily as needed. ?  ?rosuvastatin 5 MG tablet ?Commonly known as: Crestor ?Take 1 tablet (5 mg total) by mouth daily. ?  ?VITAMIN D PO ?Take by mouth daily. ?  ? ?  ? ? ? ?OBJECTIVE:  ? ?Vital Signs: BP (!) 146/84 (BP Location: Left Arm, Patient Position: Sitting, Cuff Size: Small)   Pulse 63   Ht 6' 1"  (1.854 m)   Wt 200 lb (90.7 kg)   SpO2 99%   BMI 26.39 kg/m?   ?Wt Readings from Last 3 Encounters:  ?06/25/21 200 lb (90.7 kg)  ?05/29/21 203 lb (92.1 kg)  ?05/13/21 200 lb (90.7 kg)  ? ? ? ?Exam: ?General: Pt appears well and is in NAD  ?Neck: General: Supple without adenopathy. ?Thyroid: Thyroid size normal.  No goiter or nodules appreciated.  ?Lungs: Clear with good BS bilat with no rales, rhonchi, or wheezes  ?Heart: RRR   ?Abdomen: Normoactive bowel sounds, soft, nontender, without masses or organomegaly palpable  ?Extremities: Trace  pretibial edema.   ?Neuro: MS is good with appropriate affect, pt is alert and Ox3  ? ? ?DM foot exam: 06/25/2021 ? ?The skin of the feet is intact without  sores or ulcerations. ?The pedal pulses are 2+ on right and 2+ on left. ?The sensation is intact to a screening 5.07, 10 gram monofilament bilaterally ? ? ? ?DATA REVIEWED: ? ?Lab Results  ?Component Value Date  ? HGBA1C 6.1 (A) 06/25/2021  ? HGBA1C 6.1 03/31/2021  ? HGBA1C 6.1 (A) 12/23/2020  ? ? Latest Reference Range & Units 03/31/21 09:42  ?CO2 19 - 32 mEq/L 30  ?Glucose 70 - 99 mg/dL 149 (  H)  ?BUN 6 - 23 mg/dL 24 (H)  ?Creatinine 0.40 - 1.50 mg/dL 0.92  ?Calcium 8.4 - 10.5 mg/dL 9.3  ?Alkaline Phosphatase 39 - 117 U/L 55  ?Albumin 3.5 - 5.2 g/dL 4.4  ?AST 0 - 37 U/L 13  ?ALT 0 - 53 U/L 12  ?Total Protein 6.0 - 8.3 g/dL 7.0  ?Bilirubin, Direct 0.0 - 0.3 mg/dL 0.2  ?Total Bilirubin 0.2 - 1.2 mg/dL 1.2  ?GFR >60.00 mL/min 82.73  ? ? Latest Reference Range & Units 03/31/21 09:42  ?MICROALB/CREAT RATIO 0.0 - 30.0 mg/g 6.3  ? ? ? Latest Reference Range & Units 12/23/20 08:15  ?Total CHOL/HDL Ratio  4  ?Cholesterol 0 - 200 mg/dL 177  ?HDL Cholesterol >39.00 mg/dL 43.30  ?LDL (calc) 0 - 99 mg/dL 120 (H)  ?NonHDL  133.88  ?Triglycerides 0.0 - 149.0 mg/dL 70.0  ?VLDL 0.0 - 40.0 mg/dL 14.0  ? ? ? ?ASSESSMENT / PLAN / RECOMMENDATIONS:  ? ?1) Type 2 Diabetes Mellitus, optimally controlled, With out complications - Most recent A1c of 6.1%. Goal A1c <7.0%.   ? ? ?-His A1c remains at goal ?-He has been encouraged to continue with lifestyle changes ?-He does not like taking too many medications hence his Vitamin D and Rosuvastatin, I Have Asked the Patient to Reduce Metformin to 1 Tablet the Day As His A1c Is Optimal and I Prefer for Him to Take His Vitamin D and Rosuvastatin ? ? ?MEDICATIONS: ?Decrease metformin 500 mg XR daily ? ? ? ?2) Diabetic complications:  ?Eye: Does not have known diabetic retinopathy.  ?Neuro/ Feet: Does not have known diabetic peripheral neuropathy .  ?Renal: Patient does not have known baseline CKD. He is intolerant to losartan.  We discussed renal benefits of ARB and ACE inhibitors ? ? ?3)  Dyslipidemia: ? ?-LDL above goal at 120 mg/DL, this is due to imperfect adherence to rosuvastatin ?-Discussed cardiovascular benefits of statin therapy and I have encouraged him to restart, he could restart every ot

## 2021-06-25 NOTE — Patient Instructions (Signed)
Decrease Metformin 500 mg XR daily  ?Start Vitamin D 2000 iu daily  ?

## 2021-06-27 ENCOUNTER — Other Ambulatory Visit: Payer: Self-pay

## 2021-06-27 ENCOUNTER — Ambulatory Visit (HOSPITAL_BASED_OUTPATIENT_CLINIC_OR_DEPARTMENT_OTHER): Payer: Medicare HMO | Admitting: Anesthesiology

## 2021-06-27 ENCOUNTER — Encounter (HOSPITAL_BASED_OUTPATIENT_CLINIC_OR_DEPARTMENT_OTHER)
Admission: RE | Disposition: A | Discharge status: Home / Self Care | Payer: Self-pay | Source: Home / Self Care | Attending: Surgery

## 2021-06-27 ENCOUNTER — Ambulatory Visit (HOSPITAL_BASED_OUTPATIENT_CLINIC_OR_DEPARTMENT_OTHER)
Admission: RE | Admit: 2021-06-27 | Discharge: 2021-06-27 | Disposition: A | Discharge status: Home / Self Care | Payer: Medicare HMO | Attending: Surgery | Admitting: Surgery

## 2021-06-27 ENCOUNTER — Encounter (HOSPITAL_BASED_OUTPATIENT_CLINIC_OR_DEPARTMENT_OTHER): Payer: Self-pay | Admitting: Surgery

## 2021-06-27 DIAGNOSIS — K5190 Ulcerative colitis, unspecified, without complications: Secondary | ICD-10-CM | POA: Insufficient documentation

## 2021-06-27 DIAGNOSIS — Z87891 Personal history of nicotine dependence: Secondary | ICD-10-CM | POA: Diagnosis not present

## 2021-06-27 DIAGNOSIS — Z7984 Long term (current) use of oral hypoglycemic drugs: Secondary | ICD-10-CM | POA: Diagnosis not present

## 2021-06-27 DIAGNOSIS — I10 Essential (primary) hypertension: Secondary | ICD-10-CM | POA: Diagnosis not present

## 2021-06-27 DIAGNOSIS — K429 Umbilical hernia without obstruction or gangrene: Secondary | ICD-10-CM | POA: Diagnosis not present

## 2021-06-27 DIAGNOSIS — K4020 Bilateral inguinal hernia, without obstruction or gangrene, not specified as recurrent: Secondary | ICD-10-CM | POA: Diagnosis not present

## 2021-06-27 DIAGNOSIS — E119 Type 2 diabetes mellitus without complications: Secondary | ICD-10-CM | POA: Insufficient documentation

## 2021-06-27 DIAGNOSIS — Z79899 Other long term (current) drug therapy: Secondary | ICD-10-CM | POA: Insufficient documentation

## 2021-06-27 HISTORY — DX: Presence of spectacles and contact lenses: Z97.3

## 2021-06-27 HISTORY — DX: Other hypertrophic cardiomyopathy: I42.2

## 2021-06-27 HISTORY — DX: Bifascicular block: I45.2

## 2021-06-27 HISTORY — DX: Personal history of other malignant neoplasm of skin: Z85.828

## 2021-06-27 HISTORY — PX: INGUINAL HERNIA REPAIR: SHX194

## 2021-06-27 HISTORY — DX: Left sided colitis without complications: K51.50

## 2021-06-27 HISTORY — DX: Nocturia: R35.1

## 2021-06-27 HISTORY — DX: Personal history of adenomatous and serrated colon polyps: Z86.0101

## 2021-06-27 HISTORY — DX: Personal history of colonic polyps: Z86.010

## 2021-06-27 HISTORY — PX: UMBILICAL HERNIA REPAIR: SHX196

## 2021-06-27 HISTORY — DX: Bilateral inguinal hernia, without obstruction or gangrene, not specified as recurrent: K40.20

## 2021-06-27 HISTORY — DX: Personal history of other diseases of the digestive system: Z87.19

## 2021-06-27 HISTORY — DX: Type 2 diabetes mellitus without complications: E11.9

## 2021-06-27 LAB — POCT I-STAT, CHEM 8
BUN: 21 mg/dL (ref 8–23)
Calcium, Ion: 1.28 mmol/L (ref 1.15–1.40)
Chloride: 96 mmol/L — ABNORMAL LOW (ref 98–111)
Creatinine, Ser: 0.8 mg/dL (ref 0.61–1.24)
Glucose, Bld: 147 mg/dL — ABNORMAL HIGH (ref 70–99)
HCT: 45 % (ref 39.0–52.0)
Hemoglobin: 15.3 g/dL (ref 13.0–17.0)
Potassium: 3.4 mmol/L — ABNORMAL LOW (ref 3.5–5.1)
Sodium: 142 mmol/L (ref 135–145)
TCO2: 31 mmol/L (ref 22–32)

## 2021-06-27 LAB — GLUCOSE, CAPILLARY: Glucose-Capillary: 205 mg/dL — ABNORMAL HIGH (ref 70–99)

## 2021-06-27 SURGERY — REPAIR, HERNIA, INGUINAL, BILATERAL, LAPAROSCOPIC
Anesthesia: General | Site: Groin | Laterality: Bilateral

## 2021-06-27 MED ORDER — PHENYLEPHRINE HCL (PRESSORS) 10 MG/ML IV SOLN
INTRAVENOUS | Status: DC | PRN
  Administered 2021-06-27 (×2): 80 ug via INTRAVENOUS

## 2021-06-27 MED ORDER — LIDOCAINE HCL (CARDIAC) PF 100 MG/5ML IV SOSY
PREFILLED_SYRINGE | INTRAVENOUS | Status: DC | PRN
  Administered 2021-06-27: 60 mg via INTRAVENOUS

## 2021-06-27 MED ORDER — ROCURONIUM BROMIDE 10 MG/ML (PF) SYRINGE
PREFILLED_SYRINGE | INTRAVENOUS | Status: AC
  Filled 2021-06-27: qty 10

## 2021-06-27 MED ORDER — KETOROLAC TROMETHAMINE 30 MG/ML IJ SOLN
INTRAMUSCULAR | Status: AC
  Filled 2021-06-27: qty 1

## 2021-06-27 MED ORDER — MIDAZOLAM HCL 2 MG/2ML IJ SOLN
INTRAMUSCULAR | Status: AC
  Filled 2021-06-27: qty 2

## 2021-06-27 MED ORDER — TRAMADOL HCL 50 MG PO TABS: 50.0000 mg | ORAL_TABLET | Freq: Four times a day (QID) | ORAL | 0 refills | Status: DC | PRN

## 2021-06-27 MED ORDER — FENTANYL CITRATE (PF) 100 MCG/2ML IJ SOLN
INTRAMUSCULAR | Status: AC
  Filled 2021-06-27: qty 2

## 2021-06-27 MED ORDER — ACETAMINOPHEN 500 MG PO TABS
ORAL_TABLET | ORAL | Status: AC
Start: 2021-06-27 — End: ?
  Filled 2021-06-27: qty 2

## 2021-06-27 MED ORDER — KETOROLAC TROMETHAMINE 15 MG/ML IJ SOLN
INTRAMUSCULAR | Status: DC | PRN
  Administered 2021-06-27: 15 mg via INTRAVENOUS

## 2021-06-27 MED ORDER — ROCURONIUM BROMIDE 100 MG/10ML IV SOLN
INTRAVENOUS | Status: DC | PRN
  Administered 2021-06-27: 20 mg via INTRAVENOUS
  Administered 2021-06-27: 50 mg via INTRAVENOUS

## 2021-06-27 MED ORDER — CEFAZOLIN SODIUM-DEXTROSE 2-4 GM/100ML-% IV SOLN
INTRAVENOUS | Status: AC
Start: 2021-06-27 — End: ?
  Filled 2021-06-27: qty 100

## 2021-06-27 MED ORDER — KETOROLAC TROMETHAMINE 15 MG/ML IJ SOLN: 15.0000 mg | Freq: Once | INTRAMUSCULAR | Status: DC | PRN

## 2021-06-27 MED ORDER — CEFAZOLIN SODIUM-DEXTROSE 2-4 GM/100ML-% IV SOLN
2.0000 g | INTRAVENOUS | Status: AC
  Administered 2021-06-27: 2 g via INTRAVENOUS

## 2021-06-27 MED ORDER — CHLORHEXIDINE GLUCONATE CLOTH 2 % EX PADS: 6.0000 | MEDICATED_PAD | Freq: Once | CUTANEOUS | Status: DC

## 2021-06-27 MED ORDER — BUPIVACAINE-EPINEPHRINE 0.25% -1:200000 IJ SOLN
INTRAMUSCULAR | Status: DC | PRN
  Administered 2021-06-27: 20 mL

## 2021-06-27 MED ORDER — FENTANYL CITRATE (PF) 100 MCG/2ML IJ SOLN
INTRAMUSCULAR | Status: DC | PRN
  Administered 2021-06-27 (×2): 50 ug via INTRAVENOUS

## 2021-06-27 MED ORDER — ONDANSETRON HCL 4 MG/2ML IJ SOLN: 4.0000 mg | Freq: Once | INTRAMUSCULAR | Status: DC | PRN

## 2021-06-27 MED ORDER — ACETAMINOPHEN 500 MG PO TABS
1000.0000 mg | ORAL_TABLET | ORAL | Status: AC
  Administered 2021-06-27: 1000 mg via ORAL

## 2021-06-27 MED ORDER — ENSURE PRE-SURGERY PO LIQD: 296.0000 mL | Freq: Once | ORAL | Status: DC

## 2021-06-27 MED ORDER — BUPIVACAINE LIPOSOME 1.3 % IJ SUSP: 20.0000 mL | Freq: Once | INTRAMUSCULAR | Status: DC

## 2021-06-27 MED ORDER — PHENYLEPHRINE 80 MCG/ML (10ML) SYRINGE FOR IV PUSH (FOR BLOOD PRESSURE SUPPORT)
PREFILLED_SYRINGE | INTRAVENOUS | Status: AC
  Filled 2021-06-27: qty 10

## 2021-06-27 MED ORDER — LIDOCAINE HCL (PF) 2 % IJ SOLN
INTRAMUSCULAR | Status: AC
  Filled 2021-06-27: qty 5

## 2021-06-27 MED ORDER — BUPIVACAINE-EPINEPHRINE 0.25% -1:200000 IJ SOLN
INTRAMUSCULAR | Status: AC
  Filled 2021-06-27: qty 1

## 2021-06-27 MED ORDER — SUGAMMADEX SODIUM 200 MG/2ML IV SOLN
INTRAVENOUS | Status: DC | PRN
  Administered 2021-06-27: 200 mg via INTRAVENOUS

## 2021-06-27 MED ORDER — AMISULPRIDE (ANTIEMETIC) 5 MG/2ML IV SOLN
INTRAVENOUS | Status: AC
  Filled 2021-06-27: qty 4

## 2021-06-27 MED ORDER — BUPIVACAINE LIPOSOME 1.3 % IJ SUSP
INTRAMUSCULAR | Status: DC | PRN
  Administered 2021-06-27: 50 mL

## 2021-06-27 MED ORDER — LACTATED RINGERS IV SOLN: INTRAVENOUS | Status: DC

## 2021-06-27 MED ORDER — FENTANYL CITRATE (PF) 100 MCG/2ML IJ SOLN: 25.0000 ug | INTRAMUSCULAR | Status: DC | PRN

## 2021-06-27 MED ORDER — ONDANSETRON HCL 4 MG/2ML IJ SOLN
INTRAMUSCULAR | Status: DC | PRN
  Administered 2021-06-27: 4 mg via INTRAVENOUS

## 2021-06-27 MED ORDER — GABAPENTIN 300 MG PO CAPS
ORAL_CAPSULE | ORAL | Status: AC
  Filled 2021-06-27: qty 1

## 2021-06-27 MED ORDER — AMISULPRIDE (ANTIEMETIC) 5 MG/2ML IV SOLN
10.0000 mg | Freq: Once | INTRAVENOUS | Status: AC | PRN
  Administered 2021-06-27: 10 mg via INTRAVENOUS

## 2021-06-27 MED ORDER — DEXAMETHASONE SODIUM PHOSPHATE 10 MG/ML IJ SOLN
INTRAMUSCULAR | Status: AC
  Filled 2021-06-27: qty 1

## 2021-06-27 MED ORDER — PROPOFOL 10 MG/ML IV BOLUS
INTRAVENOUS | Status: AC
  Filled 2021-06-27: qty 20

## 2021-06-27 MED ORDER — DEXAMETHASONE SODIUM PHOSPHATE 4 MG/ML IJ SOLN
INTRAMUSCULAR | Status: DC | PRN
  Administered 2021-06-27: 5 mg via INTRAVENOUS

## 2021-06-27 MED ORDER — PROPOFOL 10 MG/ML IV BOLUS
INTRAVENOUS | Status: DC | PRN
  Administered 2021-06-27: 50 mg via INTRAVENOUS
  Administered 2021-06-27: 150 mg via INTRAVENOUS

## 2021-06-27 MED ORDER — BUPIVACAINE LIPOSOME 1.3 % IJ SUSP
INTRAMUSCULAR | Status: AC
  Filled 2021-06-27: qty 20

## 2021-06-27 MED ORDER — GABAPENTIN 300 MG PO CAPS
300.0000 mg | ORAL_CAPSULE | ORAL | Status: AC
  Administered 2021-06-27: 300 mg via ORAL

## 2021-06-27 MED ORDER — MIDAZOLAM HCL 5 MG/5ML IJ SOLN
INTRAMUSCULAR | Status: DC | PRN
  Administered 2021-06-27 (×2): 1 mg via INTRAVENOUS

## 2021-06-27 SURGICAL SUPPLY — 61 items
APL PRP STRL LF DISP 70% ISPRP (MISCELLANEOUS) ×2
APPLIER CLIP 5 13 M/L LIGAMAX5 (MISCELLANEOUS)
APR CLP MED LRG 5 ANG JAW (MISCELLANEOUS)
BINDER ABDOMINAL 12 ML 46-62 (SOFTGOODS) ×4 IMPLANT
CABLE HIGH FREQUENCY MONO STRZ (ELECTRODE) ×3 IMPLANT
CHLORAPREP W/TINT 26 (MISCELLANEOUS) ×3 IMPLANT
CLIP APPLIE 5 13 M/L LIGAMAX5 (MISCELLANEOUS) IMPLANT
DEVICE SECURE STRAP 25 ABSORB (INSTRUMENTS) IMPLANT
DEVICE TROCAR PUNCTURE CLOSURE (ENDOMECHANICALS) ×3 IMPLANT
DRAPE WARM FLUID 44X44 (DRAPES) ×3 IMPLANT
DRSG TEGADERM 2-3/8X2-3/4 SM (GAUZE/BANDAGES/DRESSINGS) ×7 IMPLANT
DRSG TEGADERM 4X4.75 (GAUZE/BANDAGES/DRESSINGS) ×3 IMPLANT
ELECT REM PT RETURN 9FT ADLT (ELECTROSURGICAL) ×3
ELECTRODE REM PT RTRN 9FT ADLT (ELECTROSURGICAL) ×2 IMPLANT
GAUZE 4X4 16PLY ~~LOC~~+RFID DBL (SPONGE) ×3 IMPLANT
GAUZE SPONGE 2X2 12PLY NS (GAUZE/BANDAGES/DRESSINGS) ×1 IMPLANT
GLOVE BIO SURGEON STRL SZ 6.5 (GLOVE) ×2 IMPLANT
GLOVE BIO SURGEON STRL SZ7 (GLOVE) ×1 IMPLANT
GLOVE BIOGEL PI IND STRL 6.5 (GLOVE) IMPLANT
GLOVE BIOGEL PI IND STRL 7.0 (GLOVE) IMPLANT
GLOVE BIOGEL PI INDICATOR 6.5 (GLOVE) ×1
GLOVE BIOGEL PI INDICATOR 7.0 (GLOVE) ×1
GLOVE ECLIPSE 8.0 STRL XLNG CF (GLOVE) ×3 IMPLANT
GLOVE SRG 8 PF TXTR STRL LF DI (GLOVE) ×2 IMPLANT
GLOVE SURG UNDER POLY LF SZ8 (GLOVE) ×3
GOWN STRL REUS W/TWL XL LVL3 (GOWN DISPOSABLE) ×3 IMPLANT
IRRIG SUCT STRYKERFLOW 2 WTIP (MISCELLANEOUS)
IRRIGATION SUCT STRKRFLW 2 WTP (MISCELLANEOUS) IMPLANT
KIT TURNOVER CYSTO (KITS) ×3 IMPLANT
MANIFOLD NEPTUNE II (INSTRUMENTS) IMPLANT
MESH HERNIA 6X6 BARD (Mesh General) IMPLANT
MESH HERNIA BARD 6X6 (Mesh General) ×3 IMPLANT
NDL SPNL 22GX3.5 QUINCKE BK (NEEDLE) IMPLANT
NEEDLE HYPO 22GX1.5 SAFETY (NEEDLE) ×6 IMPLANT
NEEDLE SPNL 22GX3.5 QUINCKE BK (NEEDLE) IMPLANT
NS IRRIG 500ML POUR BTL (IV SOLUTION) ×3 IMPLANT
PACK BASIN DAY SURGERY FS (CUSTOM PROCEDURE TRAY) ×3 IMPLANT
PAD POSITIONING PINK XL (MISCELLANEOUS) ×3 IMPLANT
SCISSORS LAP 5X35 DISP (ENDOMECHANICALS) ×3 IMPLANT
SET TUBE SMOKE EVAC HIGH FLOW (TUBING) ×3 IMPLANT
SHEARS HARMONIC ACE PLUS 36CM (ENDOMECHANICALS) IMPLANT
SLEEVE ADV FIXATION 5X100MM (TROCAR) ×3 IMPLANT
SPONGE GAUZE 2X2 8PLY STRL LF (GAUZE/BANDAGES/DRESSINGS) ×9 IMPLANT
SPONGE T-LAP 18X18 ~~LOC~~+RFID (SPONGE) ×3 IMPLANT
STRIP CLOSURE SKIN 1/2X4 (GAUZE/BANDAGES/DRESSINGS) ×6 IMPLANT
SUT MNCRL AB 4-0 PS2 18 (SUTURE) ×4 IMPLANT
SUT PDS AB 1 CT1 27 (SUTURE) ×6 IMPLANT
SUT PROLENE 1 CT 1 30 (SUTURE) ×12 IMPLANT
SUT VIC AB 2-0 SH 27 (SUTURE)
SUT VIC AB 2-0 SH 27XBRD (SUTURE) IMPLANT
SUT VIC AB 3-0 SH 27 (SUTURE)
SUT VIC AB 3-0 SH 27X BRD (SUTURE) IMPLANT
SUT VICRYL 0 UR6 27IN ABS (SUTURE) IMPLANT
TOWEL OR 17X26 10 PK STRL BLUE (TOWEL DISPOSABLE) ×3 IMPLANT
TRAY DSU PREP LF (CUSTOM PROCEDURE TRAY) ×3 IMPLANT
TRAY LAPAROSCOPIC (CUSTOM PROCEDURE TRAY) ×3 IMPLANT
TROCAR ADV FIXATION 11X100MM (TROCAR) IMPLANT
TROCAR ADV FIXATION 5X100MM (TROCAR) ×3 IMPLANT
TROCAR BLADELESS OPT 5 100 (ENDOMECHANICALS) ×3 IMPLANT
TROCAR XCEL BLUNT TIP 100MML (ENDOMECHANICALS) ×3 IMPLANT
WATER STERILE IRR 500ML POUR (IV SOLUTION) ×3 IMPLANT

## 2021-06-27 NOTE — Anesthesia Procedure Notes (Signed)
Procedure Name: Intubation ?Date/Time: 06/27/2021 9:52 AM ?Performed by: Bufford Spikes, CRNA ?Pre-anesthesia Checklist: Patient identified, Emergency Drugs available, Suction available and Patient being monitored ?Patient Re-evaluated:Patient Re-evaluated prior to induction ?Oxygen Delivery Method: Circle system utilized ?Preoxygenation: Pre-oxygenation with 100% oxygen ?Induction Type: IV induction ?Ventilation: Mask ventilation without difficulty ?Laryngoscope Size: Sabra Heck and 2 ?Tube type: Oral ?Tube size: 7.0 mm ?Number of attempts: 1 ?Airway Equipment and Method: Stylet ?Placement Confirmation: ETT inserted through vocal cords under direct vision, positive ETCO2 and breath sounds checked- equal and bilateral ?Tube secured with: Tape ?Dental Injury: Teeth and Oropharynx as per pre-operative assessment  ? ? ? ? ?

## 2021-06-27 NOTE — H&P (Signed)
?06/27/2021 ? ? ?REFERRING PHYSICIAN: Self ? ?Patient Care Team: ?Janith Lima, MD as PCP - General (Internal Medicine) ?Gloris Ham, MD as Referring Physician (Endocrinology) ?Rakin Lemelle, Adrian Saran, MD as Consulting Provider (General Surgery) ?Candee Furbish, MD (Cardiovascular Disease) ? ?PROVIDER: Hollace Kinnier, MD ? ?[facility] MRN: [id number] ?DOB: [birth year] ?DATE OF ENCOUNTER: 04/22/2021 ? ?SUBJECTIVE  ? ?Chief Complaint: Inguinal Hernia ? ? ?History of Present Illness: ?Patrick Floyd is a 76 y.o. male who is seen today  ?as an office consultation at the request of Dr. Pasty Arch  ?for evaluation of Inguinal Hernia ?.  ? ?Patient with stable health issues including hypertension hypercholesterolemia. Followed by Dr. Candee Furbish for hypertrophic cardiomyopathy with right bundle branch block/left anterior fascicular block/bifascicular block. Recovered from [place] -diagnosed in late December. Had antiviral treatment. Noticed a bulge in his right groin last year. Has become more sensitive and bothersome. Discussed with his primary care physician. Dr. Ronnald Ramp suspected inguinal hernia. Surgical consultation offered. ? ?Patient here is here with his wife. He notes some right groin bulging discomfort starting around Thanksgiving. Do not know if is related to anything heavy particularly. Thinks it is got little larger. Occasionally gets some aching and queasiness with it. Now it is more in the middle. Normally moves his bowels every day. Never had any abdominal surgery. Maybe urinates once or twice at night but that has been stable. No difficulty starting stream or any prostatism. He does have some cardiac issues but decent exercise tolerance. He was due to get a follow-up study by Dr. Gypsy Balsam but held off once he got his COVID diagnosis. He wanted to discuss the hernia first. He can walk 2 miles without difficulty. He does not smoke. Nondiabetic. No sleep apnea. Not on blood thinners. I think they snowbird down  to [place] occasionally. ? ?Medical History: ? ?Past Medical History:  ?Diagnosis Date  ? Diabetes (CMS-HCC)  ? Hyperlipemia  ? Hypertension  ? RBBB (right bundle branch block)  ? Ulcerative colitis (CMS-HCC)  ? ?There is no problem list on file for this patient. ? ?Past Surgical History:  ?Procedure Laterality Date  ? PARATHYROIDECTOMY N/A 08/20/2016  ?Procedure: PARATHYROIDECTOMY OR EXPLORATION OF PARATHYROID(S); Surgeon: Isla Pence, MD; Location: [facility]; Service: General Surgery; Laterality: N/A; [facility] 850 bed w/ thin mattress, , pt would like to be first case.  ? MONITORING CRANIAL NERVES UNILATERAL N/A 08/20/2016  ?Procedure: NEEDLE ELECTROMYOGRAPHY; CRANIAL NERVE SUPPLIED MUSCLE(S), UNILATERAL - NIMS nerve monitor; Surgeon: Isla Pence, MD; Location: [facility]; Service: General Surgery; Laterality: N/A; NIMS nerve monitor  ? ? ?No Known Allergies ? ?Current Outpatient Medications on File Prior to Visit  ?Medication Sig Dispense Refill  ? mesalamine (DELZICOL) 400 mg cdti capsule Take 400 mg by mouth 3 (three) times daily  ? amLODIPine (NORVASC) 10 MG tablet Take 10 mg by mouth once daily  ? metFORMIN (GLUCOPHAGE-XR) 500 MG XR tablet metformin ER 500 mg tablet,extended release 24 hr  ? metoprolol succinate (TOPROL-XL) 50 MG XL tablet metoprolol succinate ER 50 mg tablet,extended release 24 hr  ? mesalamine (DELZICOL) 400 mg cdti capsule Take 1,600 mg by mouth 3 (three) times daily.  ? ? acetaminophen (TYLENOL) 500 MG tablet Take by mouth as needed for Pain.  ? calcium carbonate (TUMS E-X) 300 mg (750 mg) chewable tablet Take 1 tablet (750 mg of salt total) by mouth 2 (two) times daily with meals. 90 tablet 0  ? cholecalciferol (VITAMIN D3) 2,000 unit capsule Take 2,000  Units by mouth once daily.  ? ibuprofen (ADVIL,MOTRIN) 400 MG tablet Take 400 mg by mouth every 6 (six) hours as needed for Pain.  ? loratadine (CLARITIN) 10 mg tablet Take 10 mg by mouth as needed for Allergies.   ? ?No current facility-administered medications on file prior to visit.  ? ?Family History  ?Problem Relation Age of Onset  ? Breast cancer Mother  ? High blood pressure (Hypertension) Father  ? ? ?Social History  ? ?Tobacco Use  ?Smoking Status Former  ? Types: Cigarettes  ? Quit date: 06/27/1978  ? Years since quitting: 42.8  ?Smokeless Tobacco Never  ? ? ?Social History  ? ?Socioeconomic History  ? Marital status: Married  ?Tobacco Use  ? Smoking status: Former  ?Types: Cigarettes  ?Quit date: 06/27/1978  ?Years since quitting: 42.8  ? Smokeless tobacco: Never  ?Substance and Sexual Activity  ? Alcohol use: Yes  ?Comment: occasional  ? Drug use: No  ? ?############################################################ ? ?Review of Systems: ?A complete review of systems (ROS) was obtained from the patient. I have reviewed this information and discussed as appropriate with the patient. See HPI as well for other pertinent ROS. ? ?Constitutional: No fevers, chills, sweats. Weight stable ?Eyes: No vision changes, No discharge ?HENT: No sore throats, nasal drainage ?Lymph: No neck swelling, No bruising easily ?Pulmonary: No cough, productive sputum ?CV: No orthopnea, PND Patient walks 2 miles without difficulty. No exertional chest/neck/shoulder/arm pain. ? ?GI: No personal nor family history of GI/colon cancer, inflammatory bowel disease, irritable bowel syndrome, allergy such as Celiac Sprue, dietary/dairy problems, colitis, ulcers nor gastritis. No recent sick contacts/gastroenteritis. No travel outside the country. No changes in diet. ? ?Renal: No UTIs, No hematuria ?Genital: No drainage, bleeding, masses ?Musculoskeletal: No severe joint pain. Good ROM major joints ?Skin: No sores or lesions ?Heme/Lymph: No easy bleeding. No swollen lymph nodes ? ?OBJECTIVE  ? ?Vitals:  ?04/22/21 0839  ?BP: (!) 148/78  ?Pulse: 83  ?Weight: 91.8 kg (202 lb 6.4 oz)  ?Height: 185.4 cm (6' 1" )  ?Body mass index is 26.7 kg/m?. ? ?BP (!)  184/80   Pulse 71   Temp 98.8 ?F (37.1 ?C) (Oral)   Resp 16   Ht 6' 1"  (1.854 m)   Wt 90.3 kg   SpO2 99%   BMI 26.25 kg/m?  ?06/27/2021 ? ? ?PHYSICAL EXAM: ? ?Constitutional: Not cachectic. Hygeine adequate. Vitals signs as above.  ?Eyes: Pupils reactive, normal extraocular movements. Sclera nonicteric ?Neuro: CN II-XII intact. No major focal sensory defects. No major motor deficits. ?Lymph: No head/neck/groin lymphadenopathy ?Psych: No severe agitation. No severe anxiety. Judgment & insight Adequate, Oriented x4, ?HENT: Normocephalic, Mucus membranes moist. No thrush. Hearing: adequate ?Neck: Supple, No tracheal deviation. No obvious thyromegaly ?Chest: No pain to chest wall compression. Good respiratory excursion. No audible wheezing ?CV: Pulses intact. Regular rhythm. No major extremity edema ?Ext: No obvious deformity or contracture. Edema: Not present. No cyanosis ?Skin: No major subcutaneous nodules. Warm and dry ?Musculoskeletal: Severe joint rigidity not present. No obvious clubbing. No digital petechiae. Mobility: no assist device moving easily without restrictions ? ?Abdomen:  ?Flat  ?Soft. Nondistended. Nontender. Hernia: Present at: umbilical stalk 68m, size 0.7x0.7cm. Diastasis recti: Mild supraumbilical midline. No hepatomegaly. No splenomegaly. ? ?Genital/Pelvic: Inguinal hernia: Obvious right groin bulging but sensitive but reducible inguinal hernia. Smaller left inguinal hernia.. Inguinal lymph nodes: without lymphadenopathy nor hidradenitis.  ? ?Rectal: (Deferred) ? ? ? ?################################################################### ? ?Labs, Imaging and Diagnostic Testing: ? ?Located in 'Care  Everywhere' section of Epic EMR chart ? ?PRIOR [facility] NOTES: ? ?Not applicable ? ?SURGERY NOTES: ? ?Not applicable ? ?PATHOLOGY: ? ?Not applicable ? ?Assessment and Plan:  ?DIAGNOSES: ? ?Diagnoses and all orders for this visit: ? ?Non-recurrent bilateral inguinal hernia without obstruction or  gangrene ? ?Umbilical hernia without obstruction or gangrene ? ?Bifascicular bundle branch block ? ? ? ?ASSESSMENT/PLAN ? ?Pleasant active patient with right greater than left bilateral hernias and small umbilical hern

## 2021-06-27 NOTE — Anesthesia Preprocedure Evaluation (Addendum)
Anesthesia Evaluation  ?Patient identified by MRN, date of birth, ID band ?Patient awake ? ? ? ?Reviewed: ?Allergy & Precautions, NPO status , Patient's Chart, lab work & pertinent test results ? ?History of Anesthesia Complications ?(+) PONV and history of anesthetic complications ? ?Airway ?Mallampati: II ? ?TM Distance: >3 FB ?Neck ROM: Full ? ? ? Dental ?no notable dental hx. ? ?  ?Pulmonary ?former smoker,  ?  ?Pulmonary exam normal ? ? ? ? ? ? ? Cardiovascular ?hypertension, Pt. on medications and Pt. on home beta blockers ?Normal cardiovascular exam ? ?ECG: SR, rate 59 ?  ?Neuro/Psych ?negative neurological ROS ? negative psych ROS  ? GI/Hepatic ?Neg liver ROS, PUD, Ulcerative colitis, left sided ?  ?Endo/Other  ?diabetes, Oral Hypoglycemic Agents ? Renal/GU ?negative Renal ROS  ? ?  ?Musculoskeletal ?negative musculoskeletal ROS ?(+)  ? Abdominal ?  ?Peds ? Hematology ?negative hematology ROS ?(+)   ?Anesthesia Other Findings ?BILATERAl INGUINAL AND UMBILICAL HERNIAS ? Reproductive/Obstetrics ? ?  ? ? ? ? ? ? ? ? ? ? ? ? ? ?  ?  ? ? ? ? ? ? ? ?Anesthesia Physical ?Anesthesia Plan ? ?ASA: 2 ? ?Anesthesia Plan: General  ? ?Post-op Pain Management:   ? ?Induction: Intravenous ? ?PONV Risk Score and Plan: 4 or greater and Ondansetron, Dexamethasone, Midazolam and Treatment may vary due to age or medical condition ? ?Airway Management Planned: Oral ETT ? ?Additional Equipment:  ? ?Intra-op Plan:  ? ?Post-operative Plan: Extubation in OR ? ?Informed Consent: I have reviewed the patients History and Physical, chart, labs and discussed the procedure including the risks, benefits and alternatives for the proposed anesthesia with the patient or authorized representative who has indicated his/her understanding and acceptance.  ? ? ? ?Dental advisory given ? ?Plan Discussed with: CRNA ? ?Anesthesia Plan Comments:   ? ? ? ? ? ? ?Anesthesia Quick Evaluation ? ?

## 2021-06-27 NOTE — Interval H&P Note (Signed)
History and Physical Interval Note: ? ?06/27/2021 ?9:14 AM ? ?Patrick Floyd  has presented today for surgery, with the diagnosis of BILATERAl INGUINAL AND UMBILICAL HERNIAS.  The various methods of treatment have been discussed with the patient and family. After consideration of risks, benefits and other options for treatment, the patient has consented to  Procedure(s) with comments: ?LAPAROSCOPIC BILATERAL INGUINAL HERNIA REPAIR (Bilateral) - GENERA WITH ERAS PATHWAY ?LOCAL ?PRIMARY UMBILICAL HERNIA REPAIR (N/A) as a surgical intervention.  The patient's history has been reviewed, patient examined, no change in status, stable for surgery.  I have reviewed the patient's chart and labs.  Questions were answered to the patient's satisfaction.   ? ?I have re-reviewed the the patient's records, history, medications, and allergies.  I have re-examined the patient.  I again discussed intraoperative plans and goals of post-operative recovery.  The patient agrees to proceed. ? ?Patrick Floyd  ?May 05, 1945 ?[id number] ? ?Patient Care Team: ?Janith Lima, MD as PCP - General (Internal Medicine) ?Jerline Pain, MD as PCP - Cardiology (Cardiology) ?Michael Boston, MD as Consulting Physician (General Surgery) ? ?Patient Active Problem List  ? Diagnosis Date Noted  ? Type 2 diabetes mellitus without complication, without long-term current use of insulin ([place]) 06/25/2021  ? Dyslipidemia 06/25/2021  ? Inguinal hernia of right side without obstruction or gangrene 03/31/2021  ? Hypertrophic cardiomyopathy ([place]) 02/10/2021  ? Pre-operative cardiovascular examination 03/27/2020  ? Hyperparathyroidism ([place]) 09/28/2018  ? Right bundle branch block 03/12/2016  ? Vitamin D deficiency 02/18/2016  ? Alkaline phosphatase elevation 02/17/2016  ? Benign prostatic hyperplasia without lower urinary tract symptoms 02/17/2016  ? Nonspecific abnormal electrocardiogram (ECG) (EKG) 02/17/2016  ? OBESITY, CLASS I 11/13/2009  ? Type II diabetes  mellitus with manifestations ([place]) 09/14/2008  ? Hyperlipidemia with target LDL less than 100 06/20/2007  ? Essential hypertension 06/20/2007  ? Ulcerative colitis ([place]) 06/20/2007  ? ? ?Past Medical History:  ?Diagnosis Date  ? Diverticulosis of colon   ? GERD (gastroesophageal reflux disease)   ? watches diet  ? Hemorrhoids, internal   ? History of adenomatous polyp of colon   ? History of anal fissures   ? History of basal cell carcinoma   ? History of COVID-19 01/2021  ? per pt mild symptoms that resolved  ? History of primary hyperparathyroidism 01/2016  ? endocrinologist--- dr Kelton Pillar;  s/p right superior parathyroidectomy 06/ 2018,  adenoma  ? Hyperlipidemia   ? Hypertension   ? Hypertrophic cardiomyopathy ([place])   ? cardiologist--- dr Marlou Porch  ? IBS (irritable bowel syndrome)   ? Inguinal hernia, bilateral   ? Nocturia   ? PONV (postoperative nausea and vomiting)   ? RBBB (right bundle branch block with left anterior fascicular block)   ? Seasonal allergies   ? Type 2 diabetes mellitus ([place])   ? followed by dr Kelton Pillar (endocrinologist);;  (06-25-2021 pt stated does not check blood sugar at home)  ? Ulcerative colitis, left sided ([place])   ? followed by dr stark (GI)  ? Umbilical hernia   ? Wears glasses   ? ? ?Past Surgical History:  ?Procedure Laterality Date  ? COLONOSCOPY WITH PROPOFOL  05/27/2020  ? by dr stark  ? MASS EXCISION Left 02/12/2017  ? Procedure: EXCISION 6x6 CM BACK MASS;  Surgeon: Johnathan Hausen, MD;  Location: [facility];  Service: General;  Laterality: Left;  ? PARATHYROIDECTOMY Right 08/20/2016  ? @[facility] ;  right superior   (adenoma)  ? ? ?Social History  ? ?  Socioeconomic History  ? Marital status: Married  ?  Spouse name: Not on file  ? Number of children: Not on file  ? Years of education: Not on file  ? Highest education level: Not on file  ?Occupational History  ? Not on file  ?Tobacco Use  ? Smoking status: Former  ?  Years: 5.00  ?  Types: Cigarettes  ?  Quit date: 12/05/1979  ?  Years  since quitting: 41.5  ? Smokeless tobacco: Never  ?Vaping Use  ? Vaping Use: Never used  ?Substance and Sexual Activity  ? Alcohol use: Not Currently  ?  Comment: rare  ? Drug use: Never  ? Sexual activity: Not on file  ?Other Topics Concern  ? Not on file  ?Social History Narrative  ? Forensic psychologist, 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 Marriage is in good health. Work is usual amount of stress.   ? ?Social Determinants of Health  ? ?Financial Resource Strain: Not on file  ?Food Insecurity: Not on file  ?Transportation Needs: Not on file  ?Physical Activity: Not on file  ?Stress: Not on file  ?Social Connections: Not on file  ?Intimate Partner Violence: Not on file  ? ? ?Family History  ?Problem Relation Age of Onset  ? Hypertension Other   ? Breast cancer Mother   ? Heart disease Father   ? Hypertrophic cardiomyopathy Sister   ? Colon cancer Neg Hx   ? Stomach cancer Neg Hx   ? Cancer Neg Hx   ?     Colon and Prostate  ? Hyperparathyroidism Neg Hx   ? Colon polyps Neg Hx   ? Esophageal cancer Neg Hx   ? Rectal cancer Neg Hx   ? ? ?Medications Prior to Admission  ?Medication Sig Dispense Refill Last Dose  ? amLODipine (NORVASC) 10 MG tablet Take 1 tablet (10 mg total) by mouth daily. (Patient taking differently: Take 10 mg by mouth daily.) 90 tablet 3 06/27/2021 at 0700  ? Mesalamine (ASACOL) 400 MG CPDR DR capsule Take 1 capsule (400 mg total) by mouth 3 (three) times daily. (Patient taking differently: Take 400 mg by mouth daily.) 270 capsule 11 06/26/2021  ? metFORMIN (GLUCOPHAGE-XR) 500 MG 24 hr tablet Take 1 tablet (500 mg total) by mouth daily with breakfast. TAKE 2 TABLETS DAILY WITH BREAKFAST (Patient taking differently: Take 500 mg by mouth daily with breakfast. TAKE 1 TABLETS DAILY WITH BREAKFAST) 90 tablet 3 06/26/2021  ? metoprolol succinate (TOPROL-XL) 50 MG  24 hr tablet Take with or immediately following a meal. (Patient taking differently: 50 mg daily. Take with or immediately following a meal.) 90 tablet 3 06/27/2021 at 0700  ? naproxen sodium (ALEVE) 220 MG tablet Take 220 mg by mouth 2 (two) times daily as needed.   Past Week  ? VITAMIN D PO Take by mouth daily.   06/26/2021  ? acetaminophen (TYLENOL) 500 MG tablet Take 500 mg by mouth every 6 (six) hours as needed.   More than a month  ? rosuvastatin (CRESTOR) 5 MG tablet Take 1 tablet (5 mg total) by mouth daily. (Patient not taking: Reported on 06/25/2021) 90 tablet 1 More than a month  ? ? ?Current Facility-Administered Medications  ?Medication Dose Route Frequency Provider Last Rate Last Admin  ? bupivacaine liposome (EXPAREL) 1.3 % injection 266 mg  20 mL Infiltration  Once Michael Boston, MD      ? ceFAZolin (ANCEF) IVPB 2g/100 mL premix  2 g Intravenous On Call to OR Michael Boston, MD      ? Chlorhexidine Gluconate Cloth 2 % PADS 6 each  6 each Topical Once Michael Boston, MD      ? And  ? Chlorhexidine Gluconate Cloth 2 % PADS 6 each  6 each Topical Once Michael Boston, MD      ? Derrill Memo ON 06/28/2021] feeding supplement (ENSURE PRE-SURGERY) liquid 296 mL  296 mL Oral Once Michael Boston, MD      ? lactated ringers infusion   Intravenous Continuous Nolon Nations, MD 50 mL/hr at 06/27/21 0851 Continued from Pre-op at 06/27/21 0851  ?  ? ?No Known Allergies ? ?BP (!) 184/80   Pulse 71   Temp 98.8 ?F (37.1 ?C) (Oral)   Resp 16   Ht 6' 1"  (1.854 m)   Wt 90.3 kg   SpO2 99%   BMI 26.25 kg/m?  ? ?Labs: ?Results for orders placed or performed during the hospital encounter of 06/27/21 (from the past 48 hour(s))  ?I-STAT, chem 8     Status: Abnormal  ? Collection Time: 06/27/21  8:06 AM  ?Result Value Ref Range  ? Sodium 142 135 - 145 mmol/L  ? Potassium 3.4 (L) 3.5 - 5.1 mmol/L  ? Chloride 96 (L) 98 - 111 mmol/L  ? BUN 21 8 - 23 mg/dL  ? Creatinine, Ser 0.80 0.61 - 1.24 mg/dL  ? Glucose, Bld 147 (H) 70 - 99 mg/dL  ?   Comment: Glucose reference range applies only to samples taken after fasting for at least 8 hours.  ? Calcium, Ion 1.28 1.15 - 1.40 mmol/L  ? TCO2 31 22 - 32 mmol/L  ? Hemoglobin 15.3 13.0 - 17.0 g/dL  ? HCT 4

## 2021-06-27 NOTE — Discharge Instructions (Addendum)
HERNIA REPAIR: POST OP INSTRUCTIONS ? ?###################################################################### ? ?EAT ?Gradually transition to a high fiber diet with a fiber supplement over the next few weeks after discharge.  Start with a pureed / full liquid diet (see below) ? ?WALK ?Walk an hour a day.  Control your pain to do that.   ? ?CONTROL PAIN ?Control pain so that you can walk, sleep, tolerate sneezing/coughing, and go up/down stairs. ? ?HAVE A BOWEL MOVEMENT DAILY ?Keep your bowels regular to avoid problems.  OK to try a laxative to override constipation.  OK to use an antidairrheal to slow down diarrhea.  Call if not better after 2 tries ? ?CALL IF YOU HAVE PROBLEMS/CONCERNS ?Call if you are still struggling despite following these instructions. ?Call if you have concerns not answered by these instructions ? ?###################################################################### ? ? ? ?DIET: Follow a light bland diet & liquids the first 24 hours after arrival home, such as soup, liquids, starches, etc.  Be sure to drink plenty of fluids.  Quickly advance to a usual solid diet within a few days.  Avoid fast food or heavy meals as your are more likely to get nauseated or have irregular bowels.  A low-fat, high-fiber diet for the rest of your life is ideal.  ? ?Take your usually prescribed home medications unless otherwise directed. ? ?PAIN CONTROL: ?Pain is best controlled by a usual combination of three different methods TOGETHER: ?Ice/Heat ?Over the counter pain medication ?Prescription pain medication ?Most patients will experience some swelling and bruising around the hernia(s) such as the bellybutton, groins, or old incisions.  Ice packs or heating pads (30-60 minutes up to 6 times a day) will help. Use ice for the first few days to help decrease swelling and bruising, then switch to heat to help relax tight/sore spots and speed recovery.  Some people prefer to use ice alone, heat alone, alternating  between ice & heat.  Experiment to what works for you.  Swelling and bruising can take several weeks to resolve.   ?It is helpful to take an over-the-counter pain medication regularly for the first few weeks.  Choose one of the following that works best for you: ?Naproxen (Aleve, etc)  Two 29m tabs twice a day ?Ibuprofen (Advil, etc) Three 2045mtabs four times a day (every meal & bedtime) ?Acetaminophen (Tylenol, etc) 325-650103mour times a day (every meal & bedtime) ?A  prescription for pain medication should be given to you upon discharge.  Take your pain medication as prescribed.  ?If you are having problems/concerns with the prescription medicine (does not control pain, nausea, vomiting, rash, itching, etc), please call us [phone] see if we need to switch you to a different pain medicine that will work better for you and/or control your side effect better. ?If you need a refill on your pain medication, please contact your pharmacy.  They will contact our office to request authorization. Prescriptions will not be filled after 5 pm or on week-ends. ? ?Avoid getting constipated.  Between the surgery and the pain medications, it is common to experience some constipation.  Increasing fluid intake and taking a fiber supplement (such as Metamucil, Citrucel, FiberCon, MiraLax, etc) 1-2 times a day regularly will usually help prevent this problem from occurring.  A mild laxative (prune juice, Milk of Magnesia, MiraLax, etc) should be taken according to package directions if there are no bowel movements after 48 hours.   ? ?Wash / shower every day.  You may shower over the dressings  as they are waterproof.   ? ?Remove your waterproof bandages, skin tapes, and other bandages 3 days after surgery. You may replace a dressing/Band-Aid to cover the incision for comfort if you wish. You may leave the incisions open to air.  You may replace a dressing/Band-Aid to cover an incision for comfort if you wish.  Continue  to shower over incision(s) after the dressing is off. ? ?ACTIVITIES as tolerated:   ?You may resume regular (light) daily activities beginning the next day--such as daily self-care, walking, climbing stairs--gradually increasing activities as tolerated.  Control your pain so that you can walk an hour a day.  If you can walk 30 minutes without difficulty, it is safe to try more intense activity such as jogging, treadmill, bicycling, low-impact aerobics, swimming, etc. ?Save the most intensive and strenuous activity for last such as sit-ups, heavy lifting, contact sports, etc  Refrain from any heavy lifting or straining until you are off narcotics for pain control.   ?DO NOT PUSH THROUGH PAIN.  Let pain be your guide: If it hurts to do something, don't do it.  Pain is your body warning you to avoid that activity for another week until the pain goes down. ?You may drive when you are no longer taking prescription pain medication, you can comfortably wear a seatbelt, and you can safely maneuver your car and apply brakes. ?You may have sexual intercourse when it is comfortable.  ? ?FOLLOW UP in our office ?Please call [facility] at [phone] to set up an appointment to see your surgeon in the office for a follow-up appointment approximately 2-3 weeks after your surgery. ?Make sure that you call for this appointment the day you arrive home to insure a convenient appointment time. ? ?9.  If you have disability of FMLA / Family leave forms, please bring the forms to the office for processing.  (do not give to your surgeon). ? ?WHEN TO CALL us [phone]: ?Poor pain control ?Reactions / problems with new medications (rash/itching, nausea, etc)  ?Fever over 101.5 F (38.5 C) ?Inability to urinate ?Nausea and/or vomiting ?Worsening swelling or bruising ?Continued bleeding from incision. ?Increased pain, redness, or drainage from the incision ? ? The clinic staff is available to answer your questions during regular business  hours (8:30am-5pm).  Please don?t hesitate to call and ask to speak to one of our nurses for clinical concerns.  ? If you have a medical emergency, go to the nearest emergency room or call 911. ? A surgeon from [facility] is always on call at the hospitals in [place] ? ?[facility], [place] ?[address], [place], [place], [place]  [address] ? ? [address], [place], [place]   [address] ?MAIN: [phone] ? TOLL FREE: [phone] ? FAX: [phone] ?[email or web address] ? ? ? ? ? ? ? ? ? ?NO IBUPROFEN PRODUCTS UNTIL 6:00 PM TODAY. (IBUPROFEN, ADVIL, MOTRIN, NAPROXEN, ALEVE) ? ? ? ? ?Post Anesthesia Home Care Instructions ? ?Activity: ?Get plenty of rest for the remainder of the day. A responsible individual must stay with you for 24 hours following the procedure.  ?For the next 24 hours, DO NOT: ?-Drive a car ?-Paediatric nurse ?-Drink alcoholic beverages ?-Take any medication unless instructed by your physician ?-Make any legal decisions or sign important papers. ? ?Meals: ?Start with liquid foods such as gelatin or soup. Progress to regular foods as tolerated. Avoid greasy, spicy, heavy foods. If nausea and/or vomiting occur, drink only clear liquids  until the nausea and/or vomiting subsides. Call your physician if vomiting continues. ? ?Special Instructions/Symptoms: ?Your throat may feel dry or sore from the anesthesia or the breathing tube placed in your throat during surgery. If this causes discomfort, gargle with warm salt water. The discomfort should disappear within 24 hours. ? ?    ?

## 2021-06-27 NOTE — Op Note (Signed)
06/27/2021 ? ?12:18 PM ? ?PATIENT:  Patrick Floyd  76 y.o. male ? ?Patient Care Team: ?Janith Lima, MD as PCP - General (Internal Medicine) ?Jerline Pain, MD as PCP - Cardiology (Cardiology) ?Michael Boston, MD as Consulting Physician (General Surgery) ? ?PRE-OPERATIVE DIAGNOSIS:  BILATERAl INGUINAL AND UMBILICAL HERNIAS ? ?POST-OPERATIVE DIAGNOSIS:   ?BILATERAL INGUINAL HERNIAS ?UMBILICAL HERNIA ? ?PROCEDURE:   ?LAPAROSCOPIC BILATERAL INGUINAL HERNIA REPAIR ?PRIMARY UMBILICAL HERNIA REPAIR ?TRANSVERSUS ABDOMINIS PLANE (TAP) BLOCK - BILATERAL ? ?SURGEON:  Adin Hector, MD ? ?ASSISTANT: Cameron Sprang, MD, PGY5 at [facility]  ? ?ANESTHESIA:    ? ?Regional ilioinguinal and genitofemoral and spermatic cord nerve blocks  ?General ? ?Regional TRANSVERSUS ABDOMINIS PLANE (TAP) nerve block for perioperative & postoperative pain control provided with liposomal bupivacaine (Experel) mixed with 0.25% bupivacaine as a Bilateral TAP block x 100m each side at the level of the transverse abdominis & preperitoneal spaces along the flank at the anterior axillary line, from subcostal ridge to iliac crest under laparoscopic guidance  ? ? ?EBL:  Total I/O ?In: 800 [I.V.:800] ?Out: 100 [Blood:100].  See anesthesia record ? ?Delay start of Pharmacological VTE agent (>24hrs) due to surgical blood loss or risk of bleeding:  no ? ?DRAINS: NONE ? ?SPECIMEN:  NONE ? ?DISPOSITION OF SPECIMEN:  N/A ? ?COUNTS:  YES ? ?PLAN OF CARE: Discharge to home after PACU ? ?PATIENT DISPOSITION:  PACU - hemodynamically stable. ? ?INDICATION: Pleasant patient with obvious right groin bulging consistent with inguinal hernia.  Contralateral left inguinal hernia suspected and small umbilical hernia confirmed.  I recommended laparoscopic exploration and repair of hernias found.  Probable primary periumbilical hernia only. ? ?The anatomy & physiology of the abdominal wall and pelvic floor was discussed.  The pathophysiology of hernias in the inguinal and pelvic  region was discussed.  Natural history risks such as progressive enlargement, pain, incarceration & strangulation was discussed.   Contributors to complications such as smoking, obesity, diabetes, prior surgery, etc were discussed.   ? ?I feel the risks of no intervention will lead to serious problems that outweigh the operative risks; therefore, I recommended surgery to reduce and repair the hernia.  I explained laparoscopic techniques with possible need for an open approach.  I noted usual use of mesh to patch and/or buttress hernia repair ? ?Risks such as bleeding, infection, abscess, need for further treatment, heart attack, death, and other risks were discussed.  I noted a good likelihood this will help address the problem.   Goals of post-operative recovery were discussed as well.  Possibility that this will not correct all symptoms was explained.  I stressed the importance of low-impact activity, aggressive pain control, avoiding constipation, & not pushing through pain to minimize risk of post-operative chronic pain or injury. Possibility of reherniation was discussed.  We will work to minimize complications.    ? ?An educational handout further explaining the pathology & treatment options was given as well.  Questions were answered.  The patient expresses understanding & wishes to proceed with surgery. ? ?OR FINDINGS: Direct space greater than indirect pantaloon type inguinal hernia.  Some femoral laxity but no definite hernia.  No obturator hernia. ? ?On the left side small but definite direct space hernia.  No indirect inguinal, femoral, obturator hernias. ? ?1x1 cm umbilical hernia primarily repaired ? ?DESCRIPTION: ? ?The patient was identified & brought into the operating room. The patient was positioned supine with arms tucked. SCDs were active during the entire case. The  patient underwent general anesthesia without any difficulty.  The abdomen was prepped and draped in a sterile fashion. The  patient's bladder was emptied.  A Surgical Timeout confirmed our plan. ? ?I made a transverse incision through the inferior umbilical fold.  I made a small transverse nick through the anterior rectus fascia contralateral to the inguinal hernia side and placed a 0-vicryl stitch through the fascia.  I placed a Hasson trocar into the preperitoneal plane.  Entry was clean.  We induced carbon dioxide insufflation. Camera inspection revealed no injury.  I used a 49m angled scope to bluntly free the peritoneum off the infraumbilical anterior abdominal wall.  I created enough of a preperitoneal pocket to place 512mports into the right & left mid-abdomen into this preperitoneal cavity. ? ?I focused attention on the RIGHT pelvis since that was the dominant hernia side.   I used blunt & focused sharp dissection to free the peritoneum off the flank and down to the pubic rim.  I freed the anteriolateral bladder wall off the anteriolateral pelvic wall, sparing midline attachments.   I located a swath of peritoneum going into a hernia fascial defect at the  direct space consistent with  a direct space inguinal hernia..  I gradually freed the peritoneal hernia sac off safely and reduced it into the preperitoneal space.  I freed the peritoneum off the spermatic vessels & vas deferens.  Confirmed a smaller but definite indirect inguinal hernia as well.  Containing spermatic cord lipomas.  I freed peritoneum off the retroperitoneum along the psoas muscle.  Spermatic cord lipoma was dissected away.  I checked & assured hemostasis.    ? ?I turned attention on the opposite  LEFT pelvis.  I did dissection in a similar, mirror-image fashion. The patient had a direct space inguinal hernia.. Marland Kitchen Spermatic cord lipoma was dissected away & removed.    I checked & assured hemostasis.    ? ?I chose sheets of medium-weight polypropylene Bard Marlex 15x15cm, one for each side.  I cut a single sigmoid-shaped slit ~6cm from a corner of each mesh.  I  placed the meshes into the preperitoneal space & laid them as overlapping diamonds such that at the inferior points, a 6x6 cm corner flap rested in the true anterolateral pelvis, covering the obturator & femoral foramina.   I allowed the bladder to return to the pubis, this helping tuck the corners of the mesh in the anteriolateral pelvis.  The medial corners overlapped each other across midline cephalad to the pubic rim.   Given the numerous hernias of moderate size, I placed a third 15x15cm mesh in the center as a vertical diamond.  The lateral wings of the mesh overlap across the direct spaces and internal rings where the dominant hernias were.  This provided good coverage and reinforcement of the hernia repairs.  Because of the central mesh placement with good overlap, I did not place any tacks.  ? ?I held the hernia sacs cephalad & evacuated carbon dioxide.  We freed the umbilical stalk off the anterior fascia to confirm a 1 cm hernia within the umbilical stalk itself.  We primarily repaired it with #1 PDS in an interrupted fashion to good result.  Tacked the umbilical stalk back to the fascia using 0 Vicryl suture.  We closed the fascia with absorbable suture.  I closed the skin using 4-0 monocryl stitch.  Sterile dressings were applied.  ? ?The patient was extubated & arrived in the PACU in  stable condition..  I had discussed postoperative care with the patient in the holding area.  Instructions are written in the chart.  I discussed operative findings, updated the patient's status, discussed probable steps to recovery, and gave postoperative recommendations to the patient's family.  Recommendations were made.  Questions were answered.  They expressed understanding & appreciation. ? ? ?Adin Hector, M.D., F.A.C.S. ?Gastrointestinal and Minimally Invasive Surgery ?[facility] ?[address] ?[place], [place] [address] ?([phone] / Paging ? ?06/27/2021 ?12:18 PM ? ?

## 2021-06-27 NOTE — Transfer of Care (Signed)
Immediate Anesthesia Transfer of Care Note ? ?Patient: Patrick Floyd ? ?Procedure(s) Performed: LAPAROSCOPIC BILATERAL INGUINAL HERNIA REPAIR (Bilateral: Groin) ?PRIMARY UMBILICAL HERNIA REPAIR (Abdomen) ? ?Patient Location: PACU ? ?Anesthesia Type:General ? ?Level of Consciousness: awake, alert  and oriented ? ?Airway & Oxygen Therapy: Patient Spontanous Breathing and Patient connected to nasal cannula oxygen ? ?Post-op Assessment: Report given to RN and Post -op Vital signs reviewed and stable ? ?Post vital signs: Reviewed and stable ? ?Last Vitals:  ?Vitals Value Taken Time  ?BP 166/107 06/27/21 1215  ?Temp    ?Pulse 80 06/27/21 1217  ?Resp 18 06/27/21 1217  ?SpO2 98 % 06/27/21 1217  ?Vitals shown include unvalidated device data. ? ?Last Pain:  ?Vitals:  ? 06/27/21 0811  ?TempSrc: Oral  ?PainSc: 0-No pain  ?   ? ?Patients Stated Pain Goal: 5 (06/27/21 5248) ? ?Complications: No notable events documented. ?

## 2021-06-27 NOTE — Anesthesia Postprocedure Evaluation (Signed)
Anesthesia Post Note ? ?Patient: Patrick Floyd ? ?Procedure(s) Performed: LAPAROSCOPIC BILATERAL INGUINAL HERNIA REPAIR (Bilateral: Groin) ?PRIMARY UMBILICAL HERNIA REPAIR (Abdomen) ? ?  ? ?Patient location during evaluation: PACU ?Anesthesia Type: General ?Level of consciousness: awake ?Pain management: pain level controlled ?Vital Signs Assessment: post-procedure vital signs reviewed and stable ?Respiratory status: spontaneous breathing, nonlabored ventilation, respiratory function stable and patient connected to nasal cannula oxygen ?Cardiovascular status: blood pressure returned to baseline and stable ?Postop Assessment: no apparent nausea or vomiting ?Anesthetic complications: no ? ? ?No notable events documented. ? ?Last Vitals:  ?Vitals:  ? 06/27/21 1300 06/27/21 1400  ?BP: (!) 161/78 (!) 144/73  ?Pulse: 85 76  ?Resp: 17 16  ?Temp:  36.6 ?C  ?SpO2: 92% 94%  ?  ?Last Pain:  ?Vitals:  ? 06/27/21 1400  ?TempSrc:   ?PainSc: 0-No pain  ? ? ?  ?  ?  ?  ?  ?  ? ?Tiffini Blacksher P Aaleah Hirsch ? ? ? ? ?

## 2021-07-02 ENCOUNTER — Encounter (HOSPITAL_BASED_OUTPATIENT_CLINIC_OR_DEPARTMENT_OTHER): Payer: Self-pay | Admitting: Surgery

## 2021-07-21 DIAGNOSIS — H524 Presbyopia: Secondary | ICD-10-CM | POA: Diagnosis not present

## 2021-07-21 DIAGNOSIS — E119 Type 2 diabetes mellitus without complications: Secondary | ICD-10-CM | POA: Diagnosis not present

## 2021-07-21 LAB — HM DIABETES EYE EXAM

## 2021-08-26 DIAGNOSIS — H5310 Unspecified subjective visual disturbances: Secondary | ICD-10-CM | POA: Diagnosis not present

## 2021-09-18 ENCOUNTER — Other Ambulatory Visit: Payer: Self-pay | Admitting: Internal Medicine

## 2021-09-18 DIAGNOSIS — E785 Hyperlipidemia, unspecified: Secondary | ICD-10-CM

## 2021-09-29 ENCOUNTER — Ambulatory Visit: Payer: Medicare HMO | Admitting: Internal Medicine

## 2021-10-05 ENCOUNTER — Encounter: Payer: Self-pay | Admitting: Cardiology

## 2021-10-10 NOTE — Telephone Encounter (Signed)
Received a message from [place] with IT who states that she can fix this error on 8/17, the day after when the appointment was supposed to be.   I have called and made the patient aware. The patient verbalized understanding and thanked me for the call.

## 2021-10-15 ENCOUNTER — Inpatient Hospital Stay (HOSPITAL_COMMUNITY): Admission: RE | Admit: 2021-10-15 | Payer: Medicare HMO | Source: Ambulatory Visit

## 2021-11-11 ENCOUNTER — Ambulatory Visit: Payer: Medicare HMO | Admitting: Cardiology

## 2021-11-14 DIAGNOSIS — H0014 Chalazion left upper eyelid: Secondary | ICD-10-CM | POA: Diagnosis not present

## 2021-11-21 ENCOUNTER — Encounter: Payer: Self-pay | Admitting: Internal Medicine

## 2021-11-24 ENCOUNTER — Telehealth (HOSPITAL_COMMUNITY): Payer: Self-pay | Admitting: *Deleted

## 2021-11-24 NOTE — Telephone Encounter (Signed)
Reaching out to patient to offer assistance regarding upcoming cardiac imaging study; pt verbalizes understanding of appt date/time, parking situation and where to check in, and verified current allergies; name and call back number provided for further questions should they arise  Patrick Clement RN [place] [facility] [phone] office [phone] cell  Patient denies claustrophobia and metal.

## 2021-11-25 ENCOUNTER — Ambulatory Visit (HOSPITAL_COMMUNITY)
Admission: RE | Admit: 2021-11-25 | Discharge: 2021-11-25 | Disposition: A | Discharge status: Home / Self Care | Payer: Medicare HMO | Source: Ambulatory Visit | Attending: Cardiology | Admitting: Cardiology

## 2021-11-25 ENCOUNTER — Other Ambulatory Visit: Payer: Self-pay | Admitting: Cardiology

## 2021-11-25 DIAGNOSIS — E118 Type 2 diabetes mellitus with unspecified complications: Secondary | ICD-10-CM | POA: Diagnosis not present

## 2021-11-25 DIAGNOSIS — K4090 Unilateral inguinal hernia, without obstruction or gangrene, not specified as recurrent: Secondary | ICD-10-CM | POA: Insufficient documentation

## 2021-11-25 DIAGNOSIS — Z01810 Encounter for preprocedural cardiovascular examination: Secondary | ICD-10-CM | POA: Insufficient documentation

## 2021-11-25 DIAGNOSIS — I4510 Unspecified right bundle-branch block: Secondary | ICD-10-CM | POA: Insufficient documentation

## 2021-11-25 DIAGNOSIS — I422 Other hypertrophic cardiomyopathy: Secondary | ICD-10-CM | POA: Diagnosis not present

## 2021-11-25 MED ORDER — GADOBUTROL 1 MMOL/ML IV SOLN
12.0000 mL | Freq: Once | INTRAVENOUS | Status: AC | PRN
  Administered 2021-11-25: 12 mL via INTRAVENOUS

## 2021-12-01 ENCOUNTER — Ambulatory Visit: Payer: Medicare HMO | Attending: Cardiology | Admitting: Cardiology

## 2021-12-01 ENCOUNTER — Other Ambulatory Visit: Payer: Self-pay | Admitting: Cardiology

## 2021-12-01 ENCOUNTER — Ambulatory Visit (INDEPENDENT_AMBULATORY_CARE_PROVIDER_SITE_OTHER): Payer: Medicare HMO

## 2021-12-01 ENCOUNTER — Encounter: Payer: Self-pay | Admitting: Cardiology

## 2021-12-01 VITALS — BP 150/70 | HR 67 | Ht 73.0 in | Wt 196.0 lb

## 2021-12-01 DIAGNOSIS — I422 Other hypertrophic cardiomyopathy: Secondary | ICD-10-CM

## 2021-12-01 DIAGNOSIS — I452 Bifascicular block: Secondary | ICD-10-CM

## 2021-12-01 DIAGNOSIS — I493 Ventricular premature depolarization: Secondary | ICD-10-CM

## 2021-12-01 NOTE — Progress Notes (Signed)
Cardiology Office Note:    Date:  12/01/2021   ID:  Patrick Floyd, DOB [birth year], MRN [id number]  PCP:  Janith Lima, MD   [facility] Providers Cardiologist:  Candee Furbish, MD     Referring MD: Janith Lima, MD    History of Present Illness:    Patrick Floyd is a 76 y.o. male here for the follow-up of hypertrophic cardiomyopathy, hypertension, and hyperlipidemia.  He had a cardiac MRI 11/25/2021 with findings consistent for hypertrophic cardiomyopathy. Maximum hypertrophy 17 mm. Mild ascending aortic dilatation 40 mm. LVEF 66%. A 3 day ZIO monitor was ordered with plans to check chest CTA with contrast to monitor aorta in 1 year.  Prior ECHO showing septal wall 17 mm, post wall 13 mm, sister with HOCM, RBBB, DM.   At his last appointment, he was under the impression that unlike his sister who has HCM, he did not carry this diagnosis. His ECHO on the contrary does show phenotypic findings of HCM and this was discussed at a prior visit. Earlier this did not look like it was the case. Risk factors such as wall thickness greater than 30 mm portend high risk, unexplained syncope, first-degree relatives with sudden death, obstructive evidence on echocardiogram, sustained arrhythmias.  None of these were discovered in him. He was able to perform physical activity such as walking 2 miles without any difficulty.    Underwent laparoscopic bilateral inguinal hernia repair on 06/27/2021.  Today: He is accompanied by a family member. He states that he is feeling wonderful without chest pain, shortness of breath, or syncope. However, he does note that he has had to manage through some life stressors recently.  He has tried taking Crestor several times. However, he subsequently notices chest pains and overall doesn't feel well after taking Crestor. His LDL was 120 in 11/2020.  From several years ago, he notes that he was around 240 lbs. In clinic today he is down to 196 lbs.  We  reviewed the results of his cardiac MRI at length. He confirms that he has not yet completed the ZIO monitor. Of note, he has 2 daughters. We discussed genetic testing and screening for HCM.  He denies any palpitations, or peripheral edema. No lightheadedness, headaches, orthopnea, or PND.   Past Medical History:  Diagnosis Date   Diverticulosis of colon    GERD (gastroesophageal reflux disease)    watches diet   Hemorrhoids, internal    History of adenomatous polyp of colon    History of anal fissures    History of basal cell carcinoma    History of COVID-19 01/2021   per pt mild symptoms that resolved   History of primary hyperparathyroidism 01/2016   endocrinologist--- dr Kelton Pillar;  s/p right superior parathyroidectomy 06/ 2018,  adenoma   Hyperlipidemia    Hypertension    Hypertrophic cardiomyopathy [facility])    cardiologist--- dr Marlou Porch   IBS (irritable bowel syndrome)    Inguinal hernia, bilateral    Nocturia    PONV (postoperative nausea and vomiting)    RBBB (right bundle branch block with left anterior fascicular block)    Seasonal allergies    Type 2 diabetes mellitus ([place])    followed by dr Kelton Pillar (endocrinologist);;  (06-25-2021 pt stated does not check blood sugar at home)   Ulcerative colitis, left sided [facility])    followed by dr stark (GI)   Umbilical hernia    Wears glasses     Past Surgical History:  Procedure Laterality Date   COLONOSCOPY WITH PROPOFOL  05/27/2020   by dr stark   INGUINAL HERNIA REPAIR Bilateral 06/27/2021   Procedure: LAPAROSCOPIC BILATERAL INGUINAL HERNIA REPAIR;  Surgeon: Michael Boston, MD;  Location: [place];  Service: General;  Laterality: Bilateral;   MASS EXCISION Left 02/12/2017   Procedure: EXCISION 6x6 CM BACK MASS;  Surgeon: Johnathan Hausen, MD;  Location: [facility];  Service: General;  Laterality: Left;   PARATHYROIDECTOMY Right 08/20/2016   @[facility] ;  right superior   (adenoma)   UMBILICAL HERNIA REPAIR   06/27/2021   Procedure: PRIMARY UMBILICAL HERNIA REPAIR;  Surgeon: Michael Boston, MD;  Location: [place];  Service: General;;    Current Medications: Current Meds  Medication Sig   acetaminophen (TYLENOL) 500 MG tablet Take 500 mg by mouth every 6 (six) hours as needed.   amLODipine (NORVASC) 10 MG tablet Take 1 tablet (10 mg total) by mouth daily.   Mesalamine (ASACOL) 400 MG CPDR DR capsule Take 1 capsule (400 mg total) by mouth 3 (three) times daily.   metFORMIN (GLUCOPHAGE-XR) 500 MG 24 hr tablet Take 1 tablet (500 mg total) by mouth daily with breakfast. TAKE 2 TABLETS DAILY WITH BREAKFAST   metoprolol succinate (TOPROL-XL) 50 MG 24 hr tablet Take with or immediately following a meal.   naproxen sodium (ALEVE) 220 MG tablet Take 220 mg by mouth 2 (two) times daily as needed.   VITAMIN D PO Take by mouth daily.     Allergies:   Patient has no known allergies.   Social History   Socioeconomic History   Marital status: Married    Spouse name: Not on file   Number of children: Not on file   Years of education: Not on file   Highest education level: Not on file  Occupational History   Not on file  Tobacco Use   Smoking status: Former    Years: 5.00    Types: Cigarettes    Quit date: 12/05/1979    Years since quitting: 42.0   Smokeless tobacco: Never  Vaping Use   Vaping Use: Never used  Substance and Sexual Activity   Alcohol use: Not Currently    Comment: rare   Drug use: Never   Sexual activity: Not on file  Other Topics Concern   Not on file  Social History Narrative   Forensic psychologist, 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 Marriage is in good health. Work is usual amount of stress.    Social Determinants of Health   Financial Resource Strain: Not on file  Food Insecurity: Not on file  Transportation  Needs: Not on file  Physical Activity: Not on file  Stress: Not on file  Social Connections: Not on file     Family History: The patient's family history includes Breast cancer in his mother; Heart disease in his father; Hypertension in an other family member; Hypertrophic cardiomyopathy in his sister. There is no history of Colon cancer, Stomach cancer, Cancer, Hyperparathyroidism, Colon polyps, Esophageal cancer, or Rectal cancer.  ROS:   Please see the history of present illness. (+) Stress    All other systems reviewed and are negative.  EKGs/Labs/Other Studies Reviewed:    The following studies were reviewed today:  Cardiac MRI  11/25/2021: IMPRESSION: Study meets imaging criteria for hypertrophic cardiomyopathy.   Maximal hypertrophy 17 mm.  No LGE   No LVOT Obstruction   No Apical aneurysm   LVEF 66%   Evidence of mild ascending aortic dilation, 40 mm. Consider secondary imaging modality (echocardiogram, CTA Aorta Protocol, MRA Aorta Protocol) in one year if clinically indicated.  Stress test 2019: negative Blood pressure demonstrated a hypertensive response to exercise. There was no ST segment deviation noted during stress.   No ischemia. Hypertensive response to stress (222/90 mmHg).  ECHO 2018: - Left ventricle: The cavity size was normal. Wall thickness was    increased in a pattern of severe LVH. There was focal basal    hypertrophy. Systolic function was normal. The estimated ejection    fraction was in the range of 60% to 65%. Wall motion was normal;    there were no regional wall motion abnormalities. Features are    consistent with a pseudonormal left ventricular filling pattern,    with concomitant abnormal relaxation and increased filling    pressure (grade 2 diastolic dysfunction).  - Aortic valve: There was trivial regurgitation.     EKG:    EKG is personally reviewed. 12/01/2021:  EKG was not ordered. 05/13/2021:  sinus rhythm 59 right  bundle branch block left anterior fascicular block no change from prior       02/10/21: RBBB, LAFB  Recent Labs: 03/31/2021: ALT 12; Platelets 252.0; TSH 2.66 06/27/2021: BUN 21; Creatinine, Ser 0.80; Hemoglobin 15.3; Potassium 3.4; Sodium 142   Recent Lipid Panel    Component Value Date/Time   CHOL 177 12/23/2020 0815   TRIG 70.0 12/23/2020 0815   HDL 43.30 12/23/2020 0815   CHOLHDL 4 12/23/2020 0815   VLDL 14.0 12/23/2020 0815   LDLCALC 120 (H) 12/23/2020 0815   LDLDIRECT 166.7 08/24/2012 1218     Risk Assessment/Calculations:              Physical Exam:    VS:  BP (!) 150/70 (BP Location: Left Arm, Patient Position: Sitting, Cuff Size: Normal)   Pulse 67   Ht 6' 1"  (1.854 m)   Wt 196 lb (88.9 kg)   SpO2 97%   BMI 25.86 kg/m     Wt Readings from Last 3 Encounters:  12/01/21 196 lb (88.9 kg)  06/27/21 199 lb (90.3 kg)  06/25/21 200 lb (90.7 kg)     GEN:  Well nourished, well developed in no acute distress HEENT: Normal NECK: No JVD; No carotid bruits LYMPHATICS: No lymphadenopathy CARDIAC: RRR, no murmurs, no rubs, gallops RESPIRATORY:  Clear to auscultation without rales, wheezing or rhonchi  ABDOMEN: Soft, non-tender, non-distended MUSCULOSKELETAL:  No edema; No deformity  SKIN: Warm and dry NEUROLOGIC:  Alert and oriented x 3 PSYCHIATRIC:  Normal affect   ASSESSMENT:    1. Hypertrophic cardiomyopathy (HCC)     PLAN:    In order of problems listed above:  Hypertrophic cardiomyopathy ([place]) Cardiac MRI as above.  Other than 17 mm morphology, no other high risk features noted.  There is no indication for defibrillator.  I will place a ZIO monitor for 3 days to detect any evidence of ventricular arrhythmias. Both sisters had HCM  I did counsel him that his children should be screened for HCM.     Right bundle branch block Bifascicular block. May warrant pacer in the future. Stable now, no syncope.     Inguinal hernia of right side without  obstruction or gangrene Successful surgery   Type II diabetes mellitus with manifestations ([place]) Doing very well hemoglobin A1c 6.1.  40 pound weight loss.  Statin intolerance - LDL 120.  Could not tolerate Crestor 5 mg.  At some point, could consider a coronary calcium score to help make a decision point towards further cholesterol management.    Follow-up:  1 year.  Medication Adjustments/Labs and Tests Ordered: Current medicines are reviewed at length with the patient today.  Concerns regarding medicines are outlined above.   Orders Placed This Encounter  Procedures   LONG TERM MONITOR (3-14 DAYS)   No orders of the defined types were placed in this encounter.  Patient Instructions  Medication Instructions:  Your physician recommends that you continue on your current medications as directed. Please refer to the Current Medication list given to you today.  *If you need a refill on your cardiac medications before your next appointment, please call your pharmacy*   Lab Work: NONE If you have labs (blood work) drawn today and your tests are completely normal, you will receive your results only by: [place] (if you have MyChart) OR A paper copy in the mail If you have any lab test that is abnormal or we need to change your treatment, we will call you to review the results.   Testing/Procedures: [place]n Gulling- Long Term Monitor Instructions  Your physician has requested you wear a ZIO patch monitor for 3 days.  This is a single patch monitor. [employer] supplies one patch monitor per enrollment. Additional stickers are not available. Please do not apply patch if you will be having a Nuclear Stress Test,  Echocardiogram, Cardiac CT, MRI, or Chest Xray during the period you would be wearing the  monitor. The patch cannot be worn during these tests. You cannot remove and re-apply the  ZIO XT patch monitor.  Your ZIO patch monitor will be mailed 3 day USPS to your address on file.  It may take 3-5 days  to receive your monitor after you have been enrolled.  Once you have received your monitor, please review the enclosed instructions. Your monitor  has already been registered assigning a specific monitor serial # to you.  Billing and Patient Assistance Program Information  We have supplied [employer] with any of your insurance information on file for billing purposes. [employer] offers a sliding scale Patient Assistance Program for patients that do not have  insurance, or whose insurance does not completely cover the cost of the ZIO monitor.  You must apply for the Patient Assistance Program to qualify for this discounted rate.  To apply, please call [employer] at [phone], select option 4, select option 2, ask to apply for  Patient Assistance Program. Theodore Demark will ask your household income, and how many people  are in your household. They will quote your out-of-pocket cost based on that information.  [employer] will also be able to set up a 62-month interest-free payment plan if needed.  Applying the monitor   Shave hair from upper left chest.  Hold abrader disc by orange tab. Rub abrader in 40 strokes over the upper left chest as  indicated in your monitor instructions.  Clean area with 4 enclosed alcohol pads. Let dry.  Apply patch as indicated in monitor instructions. Patch will be placed under collarbone on left  side of chest with arrow pointing upward.  Rub patch adhesive wings for 2 minutes. Remove white label marked "1". Remove the white  label marked "2". Rub patch adhesive wings for 2 additional minutes.  While looking in a mirror, press and release button in center of patch.  A small green light will  flash 3-4 times. This will be your only indicator that the monitor has been turned on.  Do not shower for the first 24 hours. You may shower after the first 24 hours.  Press the button if you feel a symptom. You will hear a small click. Record Date, Time and   Symptom in the Patient Logbook.  When you are ready to remove the patch, follow instructions on the last 2 pages of Patient  Logbook. Stick patch monitor onto the last page of Patient Logbook.  Place Patient Logbook in the blue and white box. Use locking tab on box and tape box closed  securely. The blue and white box has prepaid postage on it. Please place it in the mailbox as  soon as possible. Your physician should have your test results approximately 7 days after the  monitor has been mailed back to [facility].  Call [place] at [phone] if you have questions regarding  your ZIO XT patch monitor. Call them immediately if you see an orange light blinking on your  monitor.  If your monitor falls off in less than 4 days, contact our Monitor department at [phone].  If your monitor becomes loose or falls off after 4 days call [employer] at [phone] for  suggestions on securing your monitor    Follow-Up: At [facility], you and your health needs are our priority.  As part of our continuing mission to provide you with exceptional heart care, we have created designated Provider Care Teams.  These Care Teams include your primary Cardiologist (physician) and Advanced Practice Providers (APPs -  Physician Assistants and Nurse Practitioners) who all work together to provide you with the care you need, when you need it.  We recommend signing up for the patient portal called "MyChart".  Sign up information is provided on this After Visit Summary.  MyChart is used to connect with patients for Virtual Visits (Telemedicine).  Patients are able to view lab/test results, encounter notes, upcoming appointments, etc.  Non-urgent messages can be sent to your provider as well.   To learn more about what you can do with MyChart, go to [email or web address].    Your next appointment:   1 year(s)  The format for your next appointment:   In Person  Provider:    Candee Furbish, MD   Important Information About Sugar         I,Mathew Stumpf,acting as a scribe for Candee Furbish, MD.,have documented all relevant documentation on the behalf of Candee Furbish, MD,as directed by  Candee Furbish, MD while in the presence of Candee Furbish, MD.  I, Candee Furbish, MD, have reviewed all documentation for this visit. The documentation on 12/01/21 for the exam, diagnosis, procedures, and orders are all accurate and complete.   Signed, Candee Furbish, MD  12/01/2021 10:15 AM    [place]

## 2021-12-01 NOTE — Patient Instructions (Signed)
Medication Instructions:  Your physician recommends that you continue on your current medications as directed. Please refer to the Current Medication list given to you today.  *If you need a refill on your cardiac medications before your next appointment, please call your pharmacy*   Lab Work: NONE If you have labs (blood work) drawn today and your tests are completely normal, you will receive your results only by: [place] (if you have MyChart) OR A paper copy in the mail If you have any lab test that is abnormal or we need to change your treatment, we will call you to review the results.   Testing/Procedures: Bryn Gulling- Long Term Monitor Instructions  Your physician has requested you wear a ZIO patch monitor for 3 days.  This is a single patch monitor. [employer] supplies one patch monitor per enrollment. Additional stickers are not available. Please do not apply patch if you will be having a Nuclear Stress Test,  Echocardiogram, Cardiac CT, MRI, or Chest Xray during the period you would be wearing the  monitor. The patch cannot be worn during these tests. You cannot remove and re-apply the  ZIO XT patch monitor.  Your ZIO patch monitor will be mailed 3 day USPS to your address on file. It may take 3-5 days  to receive your monitor after you have been enrolled.  Once you have received your monitor, please review the enclosed instructions. Your monitor  has already been registered assigning a specific monitor serial # to you.  Billing and Patient Assistance Program Information  We have supplied [employer] with any of your insurance information on file for billing purposes. [employer] offers a sliding scale Patient Assistance Program for patients that do not have  insurance, or whose insurance does not completely cover the cost of the ZIO monitor.  You must apply for the Patient Assistance Program to qualify for this discounted rate.  To apply, please call [employer] at [phone], select  option 4, select option 2, ask to apply for  Patient Assistance Program. Theodore Demark will ask your household income, and how many people  are in your household. They will quote your out-of-pocket cost based on that information.  [employer] will also be able to set up a 61-month interest-free payment plan if needed.  Applying the monitor   Shave hair from upper left chest.  Hold abrader disc by orange tab. Rub abrader in 40 strokes over the upper left chest as  indicated in your monitor instructions.  Clean area with 4 enclosed alcohol pads. Let dry.  Apply patch as indicated in monitor instructions. Patch will be placed under collarbone on left  side of chest with arrow pointing upward.  Rub patch adhesive wings for 2 minutes. Remove white label marked "1". Remove the white  label marked "2". Rub patch adhesive wings for 2 additional minutes.  While looking in a mirror, press and release button in center of patch. A small green light will  flash 3-4 times. This will be your only indicator that the monitor has been turned on.  Do not shower for the first 24 hours. You may shower after the first 24 hours.  Press the button if you feel a symptom. You will hear a small click. Record Date, Time and  Symptom in the Patient Logbook.  When you are ready to remove the patch, follow instructions on the last 2 pages of Patient  Logbook. Stick patch monitor onto the last page of Patient Logbook.  Place Patient Logbook in  the blue and white box. Use locking tab on box and tape box closed  securely. The blue and white box has prepaid postage on it. Please place it in the mailbox as  soon as possible. Your physician should have your test results approximately 7 days after the  monitor has been mailed back to [facility].  Call [place] at [phone] if you have questions regarding  your ZIO XT patch monitor. Call them immediately if you see an orange light blinking on your  monitor.   If your monitor falls off in less than 4 days, contact our Monitor department at [phone].  If your monitor becomes loose or falls off after 4 days call [employer] at [phone] for  suggestions on securing your monitor    Follow-Up: At [facility], you and your health needs are our priority.  As part of our continuing mission to provide you with exceptional heart care, we have created designated Provider Care Teams.  These Care Teams include your primary Cardiologist (physician) and Advanced Practice Providers (APPs -  Physician Assistants and Nurse Practitioners) who all work together to provide you with the care you need, when you need it.  We recommend signing up for the patient portal called "MyChart".  Sign up information is provided on this After Visit Summary.  MyChart is used to connect with patients for Virtual Visits (Telemedicine).  Patients are able to view lab/test results, encounter notes, upcoming appointments, etc.  Non-urgent messages can be sent to your provider as well.   To learn more about what you can do with MyChart, go to [email or web address].    Your next appointment:   1 year(s)  The format for your next appointment:   In Person  Provider:   Candee Furbish, MD   Important Information About Sugar

## 2021-12-01 NOTE — Progress Notes (Unsigned)
Enrolled for [employer] to mail a ZIO XT long term holter monitor to the patients address on file.   [id number] mailed to patient 12/01/21, applied in office 12/10/21.

## 2021-12-02 ENCOUNTER — Telehealth: Payer: Self-pay | Admitting: *Deleted

## 2021-12-02 DIAGNOSIS — I77819 Aortic ectasia, unspecified site: Secondary | ICD-10-CM

## 2021-12-02 NOTE — Telephone Encounter (Signed)
Hypertrophic cardiomyopathy.  Maximum hypertrophy 17 mm.  Mild ascending aortic dilatation 40 mm.   Lets have him wear a ZIO monitor for 3 days  Check chest CTA with contrast to monitor aorta in 1 year  Candee Furbish, MD   Zio ordered at recent office visit.

## 2021-12-10 ENCOUNTER — Telehealth: Payer: Self-pay | Admitting: Cardiology

## 2021-12-10 DIAGNOSIS — I493 Ventricular premature depolarization: Secondary | ICD-10-CM

## 2021-12-10 DIAGNOSIS — I452 Bifascicular block: Secondary | ICD-10-CM

## 2021-12-10 DIAGNOSIS — I422 Other hypertrophic cardiomyopathy: Secondary | ICD-10-CM | POA: Diagnosis not present

## 2021-12-10 NOTE — Telephone Encounter (Signed)
Patient scheduled to come in 12/10/2021, 3:30 PM to have 3 day ZIO XT applied, (mailed to his home 12/01/21).

## 2021-12-10 NOTE — Telephone Encounter (Signed)
Patient calling in to get an appt to get his heart monitor put on. Please advise

## 2021-12-10 NOTE — Telephone Encounter (Signed)
Patrick Floyd can you please assist the pt with getting an appt with you, for zio placement?

## 2021-12-17 ENCOUNTER — Encounter: Payer: Self-pay | Admitting: Cardiology

## 2021-12-18 NOTE — Telephone Encounter (Signed)

## 2021-12-19 ENCOUNTER — Encounter: Payer: Self-pay | Admitting: Internal Medicine

## 2021-12-23 ENCOUNTER — Encounter: Payer: Self-pay | Admitting: Internal Medicine

## 2021-12-23 ENCOUNTER — Ambulatory Visit: Payer: Medicare HMO | Admitting: Internal Medicine

## 2021-12-23 VITALS — BP 140/82 | HR 74 | Ht 73.0 in | Wt 197.0 lb

## 2021-12-23 DIAGNOSIS — E559 Vitamin D deficiency, unspecified: Secondary | ICD-10-CM

## 2021-12-23 DIAGNOSIS — I422 Other hypertrophic cardiomyopathy: Secondary | ICD-10-CM | POA: Diagnosis not present

## 2021-12-23 DIAGNOSIS — E119 Type 2 diabetes mellitus without complications: Secondary | ICD-10-CM | POA: Diagnosis not present

## 2021-12-23 DIAGNOSIS — I493 Ventricular premature depolarization: Secondary | ICD-10-CM | POA: Diagnosis not present

## 2021-12-23 DIAGNOSIS — E785 Hyperlipidemia, unspecified: Secondary | ICD-10-CM | POA: Diagnosis not present

## 2021-12-23 LAB — POCT GLYCOSYLATED HEMOGLOBIN (HGB A1C): Hemoglobin A1C: 6.1 % — AB (ref 4.0–5.6)

## 2021-12-23 LAB — MICROALBUMIN / CREATININE URINE RATIO
Creatinine,U: 170.8 mg/dL
Microalb Creat Ratio: 6.9 mg/g (ref 0.0–30.0)
Microalb, Ur: 11.8 mg/dL — ABNORMAL HIGH (ref 0.0–1.9)

## 2021-12-23 LAB — COMPREHENSIVE METABOLIC PANEL
ALT: 20 U/L (ref 0–53)
AST: 20 U/L (ref 0–37)
Albumin: 4.6 g/dL (ref 3.5–5.2)
Alkaline Phosphatase: 65 U/L (ref 39–117)
BUN: 16 mg/dL (ref 6–23)
CO2: 34 mEq/L — ABNORMAL HIGH (ref 19–32)
Calcium: 10.1 mg/dL (ref 8.4–10.5)
Chloride: 99 mEq/L (ref 96–112)
Creatinine, Ser: 0.86 mg/dL (ref 0.40–1.50)
GFR: 85.68 mL/min (ref >60.00)
Glucose, Bld: 147 mg/dL — ABNORMAL HIGH (ref 70–99)
Potassium: 4.5 mEq/L (ref 3.5–5.1)
Sodium: 138 mEq/L (ref 135–145)
Total Bilirubin: 1.3 mg/dL — ABNORMAL HIGH (ref 0.2–1.2)
Total Protein: 7.7 g/dL (ref 6.0–8.3)

## 2021-12-23 LAB — LIPID PANEL
Cholesterol: 206 mg/dL — ABNORMAL HIGH (ref 0–200)
HDL: 49.1 mg/dL (ref >39.00)
LDL Cholesterol: 137 mg/dL — ABNORMAL HIGH (ref 0–99)
NonHDL: 156.74
Total CHOL/HDL Ratio: 4
Triglycerides: 101 mg/dL (ref 0.0–149.0)
VLDL: 20.2 mg/dL (ref 0.0–40.0)

## 2021-12-23 LAB — POCT GLUCOSE (DEVICE FOR HOME USE): POC Glucose: 140 mg/dl — AB (ref 70–99)

## 2021-12-23 LAB — VITAMIN D 25 HYDROXY (VIT D DEFICIENCY, FRACTURES): VITD: 28.99 ng/mL — ABNORMAL LOW (ref 30.00–100.00)

## 2021-12-23 NOTE — Progress Notes (Unsigned)
Name: Patrick Floyd  Age/ Sex: 76 y.o., male   MRN/ DOB: [id number], 1945-04-20     PCP: Janith Lima, MD   Reason for Endocrinology Evaluation: Type 2 Diabetes Mellitus  Initial Endocrine Consultative Visit: 03/23/2016    PATIENT IDENTIFIER: Patrick Floyd is a 76 y.o. male with a past medical history of T2Dm, HTN , UC ocular migraines and dyslipidemia . The patient has followed with Endocrinology clinic since 03/23/2016 for consultative assistance with management of his diabetes.  DIABETIC HISTORY:  Mr. Spratt was diagnosed with DM in 2014, he has been on metformin since his diagnosis.  His hemoglobin A1c has ranged from 6.1% in 2023, peaking at 8.6% in 2019.  HYPERPARATHYROID HISTORY:  He was diagnosed with hyperparathyroidism in 2010.  He is status post parathyroidectomy in 2018  Last DXA 2018-low bone density   SUBJECTIVE:   During the last visit (06/25/2021): A1c 6.1%    Today (12/23/2021): Mr. Phifer is here for a follow up on diabetes management.  He checks his blood sugars 0 times daily.  Denies nausea, vomiting or diarrhea  Had hernia surgery in 05/2021 Has been consistently taking vitamin D  He attempted to take rosuvastatin but had chest pain and stopped it again He was recently diagnosed with hypertrophic cardiomyopathy, follows with cardiology   HOME ENDOCRINE REGIMEN:  Metformin 500 mg XR daily  Rosuvastatin 5 mg daily -not taking Vitamin D  2000 iu daily      Statin: not taking crestor  ACE-I/ARB: No    METER DOWNLOAD SUMMARY: did not bring      DIABETIC COMPLICATIONS: Microvascular complications:   Denies: CKD, neuropathy, retinopathy  Last Eye Exam: Completed 06/2021  Macrovascular complications:   Denies: CAD, CVA, PVD   HISTORY:  Past Medical History:  Past Medical History:  Diagnosis Date   Diverticulosis of colon    GERD (gastroesophageal reflux disease)    watches diet   Hemorrhoids, internal    History of  adenomatous polyp of colon    History of anal fissures    History of basal cell carcinoma    History of COVID-19 01/2021   per pt mild symptoms that resolved   History of primary hyperparathyroidism 01/2016   endocrinologist--- dr Kelton Pillar;  s/p right superior parathyroidectomy 06/ 2018,  adenoma   Hyperlipidemia    Hypertension    Hypertrophic cardiomyopathy [facility])    cardiologist--- dr Marlou Porch   IBS (irritable bowel syndrome)    Inguinal hernia, bilateral    Nocturia    PONV (postoperative nausea and vomiting)    RBBB (right bundle branch block with left anterior fascicular block)    Seasonal allergies    Type 2 diabetes mellitus ([place])    followed by dr Kelton Pillar (endocrinologist);;  (06-25-2021 pt stated does not check blood sugar at home)   Ulcerative colitis, left sided ([place])    followed by dr stark (GI)   Umbilical hernia    Wears glasses    Past Surgical History:  Past Surgical History:  Procedure Laterality Date   COLONOSCOPY WITH PROPOFOL  05/27/2020   by dr stark   INGUINAL HERNIA REPAIR Bilateral 06/27/2021   Procedure: LAPAROSCOPIC BILATERAL INGUINAL HERNIA REPAIR;  Surgeon: Michael Boston, MD;  Location: [place];  Service: General;  Laterality: Bilateral;   MASS EXCISION Left 02/12/2017   Procedure: EXCISION 6x6 CM BACK MASS;  Surgeon: Johnathan Hausen, MD;  Location: [facility];  Service: General;  Laterality: Left;   PARATHYROIDECTOMY Right  08/20/2016   @[facility] ;  right superior   (adenoma)   UMBILICAL HERNIA REPAIR  06/27/2021   Procedure: PRIMARY UMBILICAL HERNIA REPAIR;  Surgeon: Michael Boston, MD;  Location: [place];  Service: General;;   Social History:  reports that he quit smoking about 42 years ago. His smoking use included cigarettes. He has never used smokeless tobacco. He reports that he does not currently use alcohol. He reports that he does not use drugs. Family History:  Family History  Problem Relation Age of Onset    Hypertension Other    Breast cancer Mother    Heart disease Father    Hypertrophic cardiomyopathy Sister    Colon cancer Neg Hx    Stomach cancer Neg Hx    Cancer Neg Hx        Colon and Prostate   Hyperparathyroidism Neg Hx    Colon polyps Neg Hx    Esophageal cancer Neg Hx    Rectal cancer Neg Hx      HOME MEDICATIONS: Allergies as of 12/23/2021   No Known Allergies      Medication List        Accurate as of December 23, 2021  8:22 AM. If you have any questions, ask your nurse or doctor.          acetaminophen 500 MG tablet Commonly known as: TYLENOL Take 500 mg by mouth every 6 (six) hours as needed.   amLODipine 10 MG tablet Commonly known as: NORVASC Take 1 tablet (10 mg total) by mouth daily.   Mesalamine 400 MG Cpdr DR capsule Commonly known as: ASACOL Take 1 capsule (400 mg total) by mouth 3 (three) times daily.   metFORMIN 500 MG 24 hr tablet Commonly known as: GLUCOPHAGE-XR Take 1 tablet (500 mg total) by mouth daily with breakfast. TAKE 2 TABLETS DAILY WITH BREAKFAST   metoprolol succinate 50 MG 24 hr tablet Commonly known as: TOPROL-XL Take with or immediately following a meal.   naproxen sodium 220 MG tablet Commonly known as: ALEVE Take 220 mg by mouth 2 (two) times daily as needed.   VITAMIN D PO Take by mouth daily.         OBJECTIVE:   Vital Signs: BP (!) 140/82 (BP Location: Left Arm, Patient Position: Sitting, Cuff Size: Large)   Pulse 74   Ht 6' 1"  (1.854 m)   Wt 197 lb (89.4 kg)   SpO2 94%   BMI 25.99 kg/m   Wt Readings from Last 3 Encounters:  12/23/21 197 lb (89.4 kg)  12/01/21 196 lb (88.9 kg)  06/27/21 199 lb (90.3 kg)     Exam: General: Pt appears well and is in NAD  Lungs: Clear with good BS bilat with no rales, rhonchi, or wheezes  Heart: RRR   Abdomen:  soft, nontender, without masses or organomegaly palpable  Extremities: No pretibial edema.   Neuro: MS is good with appropriate affect, pt is alert and  Ox3    DM foot exam: 06/25/2021  The skin of the feet is intact without sores or ulcerations. The pedal pulses are 2+ on right and 2+ on left. The sensation is intact to a screening 5.07, 10 gram monofilament bilaterally    DATA REVIEWED:  Lab Results  Component Value Date   HGBA1C 6.1 (A) 12/23/2021   HGBA1C 6.1 (A) 06/25/2021   HGBA1C 6.1 03/31/2021    Latest Reference Range & Units 12/23/21 08:44  Sodium 135 - 145 mEq/L 138  Potassium 3.5 -  5.1 mEq/L 4.5  Chloride 96 - 112 mEq/L 99  CO2 19 - 32 mEq/L 34 (H)  Glucose 70 - 99 mg/dL 147 (H)  BUN 6 - 23 mg/dL 16  Creatinine 0.40 - 1.50 mg/dL 0.86  Calcium 8.4 - 10.5 mg/dL 10.1  Alkaline Phosphatase 39 - 117 U/L 65  Albumin 3.5 - 5.2 g/dL 4.6  AST 0 - 37 U/L 20  ALT 0 - 53 U/L 20  Total Protein 6.0 - 8.3 g/dL 7.7  Total Bilirubin 0.2 - 1.2 mg/dL 1.3 (H)  GFR >60.00 mL/min 85.68  Total CHOL/HDL Ratio  4  Cholesterol 0 - 200 mg/dL 206 (H)  HDL Cholesterol >39.00 mg/dL 49.10  LDL (calc) 0 - 99 mg/dL 137 (H)  MICROALB/CREAT RATIO 0.0 - 30.0 mg/g 6.9  NonHDL  156.74  Triglycerides 0.0 - 149.0 mg/dL 101.0  VLDL 0.0 - 40.0 mg/dL 20.2  VITD 30.00 - 100.00 ng/mL 28.99 (L)    ASSESSMENT / PLAN / RECOMMENDATIONS:   1) Type 2 Diabetes Mellitus, optimally controlled, With out complications - Most recent A1c of 6.1%. Goal A1c <7.0%.     -His A1c remains at goal -He has been encouraged to continue with lifestyle changes  MEDICATIONS: Continue  metformin 500 mg XR daily    2) Diabetic complications:  Eye: Does not have known diabetic retinopathy.  Neuro/ Feet: Does not have known diabetic peripheral neuropathy .  Renal: Patient does not have known baseline CKD. He is intolerant to losartan.  We discussed renal benefits of ARB and ACE inhibitors   3) Dyslipidemia:  -LDL above goal at 120 mg/DL, I have encouraged him to restart rosuvastatin on the last visit, patient stated that he developed chest pain when he took it so  he stopped it again -I explained to the patient not aware of rosuvastatin causing chest pain, I have asked him to discuss with cardiology regarding their opinion -Discussed cardiovascular benefits of statin therapy and I have encouraged him to restart it by taking it at bedtime   4) vitamin D deficiency:   -We discussed the importance of replenishment of this for bone health -Vitamin D is improving  Medication Continue Vitamin D3 2000 IU daily   F/U in 6 months   Signed electronically by: Mack Guise, MD  [facility]  [facility] [address] [place], [place] [address] Phone: [phone] FAX: [phone]   CC: Janith Lima, [place] [address] Phone: [phone]  Fax: [phone]  Return to Endocrinology clinic as below: Future Appointments  Date Time Provider [place]  01/19/2022  8:20 AM Janith Lima, MD [facility] None

## 2021-12-23 NOTE — Patient Instructions (Signed)
Continue  Metformin 500 mg XR daily

## 2022-01-06 ENCOUNTER — Telehealth: Payer: Self-pay | Admitting: Internal Medicine

## 2022-01-06 NOTE — Telephone Encounter (Signed)
LVM for pt to rtn my call to schedule AWV with [facility] call back # [phone] 

## 2022-01-15 ENCOUNTER — Ambulatory Visit: Payer: Medicare HMO

## 2022-01-19 ENCOUNTER — Encounter: Payer: Self-pay | Admitting: Internal Medicine

## 2022-01-19 ENCOUNTER — Ambulatory Visit (INDEPENDENT_AMBULATORY_CARE_PROVIDER_SITE_OTHER): Payer: Medicare HMO | Admitting: Internal Medicine

## 2022-01-19 ENCOUNTER — Encounter: Payer: Self-pay | Admitting: Cardiology

## 2022-01-19 VITALS — BP 156/84 | HR 61 | Temp 98.1°F | Ht 73.0 in | Wt 200.0 lb

## 2022-01-19 DIAGNOSIS — E119 Type 2 diabetes mellitus without complications: Secondary | ICD-10-CM

## 2022-01-19 DIAGNOSIS — F321 Major depressive disorder, single episode, moderate: Secondary | ICD-10-CM | POA: Diagnosis not present

## 2022-01-19 DIAGNOSIS — I10 Essential (primary) hypertension: Secondary | ICD-10-CM

## 2022-01-19 DIAGNOSIS — E785 Hyperlipidemia, unspecified: Secondary | ICD-10-CM | POA: Diagnosis not present

## 2022-01-19 DIAGNOSIS — R69 Illness, unspecified: Secondary | ICD-10-CM | POA: Diagnosis not present

## 2022-01-19 MED ORDER — ATORVASTATIN CALCIUM 20 MG PO TABS: 20.0000 mg | ORAL_TABLET | Freq: Every day | ORAL | 0 refills | Status: DC

## 2022-01-19 MED ORDER — OLMESARTAN MEDOXOMIL 20 MG PO TABS: 20.0000 mg | ORAL_TABLET | Freq: Every day | ORAL | 0 refills | Status: DC

## 2022-01-19 NOTE — Progress Notes (Signed)
Subjective:  Patient ID: Patrick Floyd, male    DOB: 09/07/1945  Age: 76 y.o. MRN: [id number]  CC: Hypertension, Diabetes, and Hyperlipidemia   HPI Patrick Floyd presents for f/up -  He is not taking the statin because he thought it caused chest pain and insomnia.  He complains of feeling stressed about his children and aging.  He complains of anxiety, insomnia, and anhedonia.  He does not feel worthless, helpless, hopeless, suicidal, or homicidal.  He walks about 2 to 3 miles every other day.  He denies exertional chest pain, shortness of breath, diaphoresis, edema, or fatigue.  Outpatient Medications Prior to Visit  Medication Sig Dispense Refill   acetaminophen (TYLENOL) 500 MG tablet Take 500 mg by mouth every 6 (six) hours as needed.     amLODipine (NORVASC) 10 MG tablet Take 1 tablet (10 mg total) by mouth daily. 90 tablet 3   Mesalamine (ASACOL) 400 MG CPDR DR capsule Take 1 capsule (400 mg total) by mouth 3 (three) times daily. 270 capsule 11   metFORMIN (GLUCOPHAGE-XR) 500 MG 24 hr tablet Take 1 tablet (500 mg total) by mouth daily with breakfast. TAKE 2 TABLETS DAILY WITH BREAKFAST (Patient taking differently: Take 500 mg by mouth daily with breakfast.) 90 tablet 3   metoprolol succinate (TOPROL-XL) 50 MG 24 hr tablet Take with or immediately following a meal. 90 tablet 3   naproxen sodium (ALEVE) 220 MG tablet Take 220 mg by mouth 2 (two) times daily as needed.     VITAMIN D PO Take by mouth daily.     No facility-administered medications prior to visit.    ROS Review of Systems  Constitutional:  Negative for chills, diaphoresis, fatigue and fever.  HENT: Negative.    Eyes: Negative.   Respiratory:  Negative for cough, chest tightness, shortness of breath and wheezing.   Cardiovascular:  Negative for chest pain, palpitations and leg swelling.  Gastrointestinal:  Negative for abdominal pain, constipation, diarrhea and nausea.  Endocrine: Negative.   Genitourinary:   Negative for difficulty urinating and dysuria.  Musculoskeletal: Negative.   Skin: Negative.   Allergic/Immunologic: Negative.   Neurological: Negative.  Negative for dizziness, weakness and headaches.  Hematological:  Negative for adenopathy. Does not bruise/bleed easily.  Psychiatric/Behavioral:  Positive for dysphoric mood and sleep disturbance. Negative for confusion, decreased concentration and suicidal ideas. The patient is nervous/anxious. The patient is not hyperactive.     Objective:  BP (!) 156/84 (BP Location: Right Arm, Patient Position: Sitting, Cuff Size: Large)   Pulse 61   Temp 98.1 F (36.7 C) (Oral)   Ht 6' 1"  (1.854 m)   Wt 200 lb (90.7 kg)   SpO2 95%   BMI 26.39 kg/m   BP Readings from Last 3 Encounters:  01/19/22 (!) 156/84  12/23/21 (!) 140/82  12/01/21 (!) 150/70    Wt Readings from Last 3 Encounters:  01/19/22 200 lb (90.7 kg)  12/23/21 197 lb (89.4 kg)  12/01/21 196 lb (88.9 kg)    Physical Exam Vitals reviewed.  HENT:     Nose: Nose normal.     Mouth/Throat:     Mouth: Mucous membranes are moist.  Eyes:     General: No scleral icterus.    Conjunctiva/sclera: Conjunctivae normal.  Cardiovascular:     Rate and Rhythm: Normal rate and regular rhythm.     Heart sounds: No murmur heard. Pulmonary:     Effort: Pulmonary effort is normal.     Breath  sounds: No stridor. No wheezing, rhonchi or rales.  Abdominal:     General: Abdomen is flat.     Palpations: There is no mass.     Tenderness: There is no abdominal tenderness. There is no guarding.     Hernia: No hernia is present.  Musculoskeletal:        General: Normal range of motion.     Cervical back: Neck supple.     Right lower leg: No edema.     Left lower leg: No edema.  Lymphadenopathy:     Cervical: No cervical adenopathy.  Skin:    General: Skin is warm and dry.  Neurological:     General: No focal deficit present.     Mental Status: He is alert.  Psychiatric:         Attention and Perception: He is inattentive.        Mood and Affect: Mood is anxious and depressed. Affect is flat. Affect is not blunt.        Speech: Speech normal. He is communicative. Speech is not rapid and pressured, delayed, slurred or tangential.        Behavior: Behavior normal. Behavior is not agitated, slowed, aggressive, withdrawn or hyperactive. Behavior is cooperative.        Thought Content: Thought content normal. Thought content is not paranoid or delusional. Thought content does not include homicidal or suicidal ideation.        Cognition and Memory: Cognition normal.     Lab Results  Component Value Date   WBC 8.8 03/31/2021   HGB 15.3 06/27/2021   HCT 45.0 06/27/2021   PLT 252.0 03/31/2021   GLUCOSE 147 (H) 12/23/2021   CHOL 206 (H) 12/23/2021   TRIG 101.0 12/23/2021   HDL 49.10 12/23/2021   LDLDIRECT 166.7 08/24/2012   LDLCALC 137 (H) 12/23/2021   ALT 20 12/23/2021   AST 20 12/23/2021   NA 138 12/23/2021   K 4.5 12/23/2021   CL 99 12/23/2021   CREATININE 0.86 12/23/2021   BUN 16 12/23/2021   CO2 34 (H) 12/23/2021   TSH 2.66 03/31/2021   PSA 1.18 03/31/2021   HGBA1C 6.1 (A) 12/23/2021   MICROALBUR 11.8 (H) 12/23/2021    MR CARDIAC MORPHOLOGY W WO CONTRAST  Result Date: 11/26/2021 CLINICAL DATA:  Clinical question of hypertrophic cardiomyopathy Study assumes BSA of 2.16 m2. EXAM: CARDIAC MRI TECHNIQUE: The patient was scanned on a 1.5 Tesla GE magnet. A dedicated cardiac coil was used. Functional imaging was done using Fiesta sequences. 2,3, and 4 chamber views were done to assess for RWMA's. Modified Simpson's rule using a short axis stack was used to calculate an ejection fraction on a dedicated work Conservation officer, nature. The patient received 12 cc of Gadavist. After 10 minutes inversion recovery sequences were used to assess for infiltration and scar tissue. Velocity encoding sequences performed for valve assessment. Patient needed to leave and  return during CMR. Native T1 will be used instead of ECV analysis. CONTRAST:  12 cc  of Gadavist FINDINGS: 1. Normal left ventricular size, with LVEDD 41 mm, and LVEDVi 68 mL/m2. Severe asymmetric septal hypertrophy, septal thickness of 17 mm, and myocardial mass index of 57 g/m2. Normal left ventricular systolic function (LVEF =69%). There are no regional wall motion abnormalities. No systemic motion of the anterior mitral valve suggestive of supine LVOT obstruction. Left ventricular parametric mapping notable for normal native T1 signal and native T2 signal. There is no late gadolinium enhancement in  the left ventricular myocardium. 2. Normal right ventricular size with RVEDVI 68 mL/m2. Normal right ventricular thickness. Normal right ventricular systolic function (RVEF =82%). There are no regional wall motion abnormalities or aneurysms. 3.  Normal left and right atrial size. 4. Normal size of the aortic root and pulmonary artery. Evidence of mild ascending aortic dilation, 40 mm. 5. Valve assessment: Aortic Valve: Morphology not well seen. There is no significant regurgitation. Regurgitant fraction 6%. Gradient 2 mm Hg. Pulmonic Valve: There is no significant regurgitation. Regurgitant fraction 2%. Tricuspid Valve: There is no significant regurgitation. Regurgitant fraction 4%. Mitral Valve: Anterior mitral valve billowing without true prolapse. There is no significant regurgitation. Regurgitant fraction 12%. 6.  Normal pericardium.  No pericardial effusion. 7. Grossly, no extracardiac findings. Recommended dedicated study if concerned for non-cardiac pathology. IMPRESSION: Study meets imaging criteria for hypertrophic cardiomyopathy. Maximal hypertrophy 17 mm. No LGE No LVOT Obstruction No Apical aneurysm LVEF 66% Evidence of mild ascending aortic dilation, 40 mm. Consider secondary imaging modality (echocardiogram, CTA Aorta Protocol, MRA Aorta Protocol) in one year if clinically indicated. Rudean Haskell  MD Electronically Signed   By: Rudean Haskell M.D.   On: 11/26/2021 11:36   MR CARDIAC VELOCITY FLOW MAP  Result Date: 11/26/2021 CLINICAL DATA:  Clinical question of hypertrophic cardiomyopathy Study assumes BSA of 2.16 m2. EXAM: CARDIAC MRI TECHNIQUE: The patient was scanned on a 1.5 Tesla GE magnet. A dedicated cardiac coil was used. Functional imaging was done using Fiesta sequences. 2,3, and 4 chamber views were done to assess for RWMA's. Modified Simpson's rule using a short axis stack was used to calculate an ejection fraction on a dedicated work Conservation officer, nature. The patient received 12 cc of Gadavist. After 10 minutes inversion recovery sequences were used to assess for infiltration and scar tissue. Velocity encoding sequences performed for valve assessment. Patient needed to leave and return during CMR. Native T1 will be used instead of ECV analysis. CONTRAST:  12 cc  of Gadavist FINDINGS: 1. Normal left ventricular size, with LVEDD 41 mm, and LVEDVi 68 mL/m2. Severe asymmetric septal hypertrophy, septal thickness of 17 mm, and myocardial mass index of 57 g/m2. Normal left ventricular systolic function (LVEF =42%). There are no regional wall motion abnormalities. No systemic motion of the anterior mitral valve suggestive of supine LVOT obstruction. Left ventricular parametric mapping notable for normal native T1 signal and native T2 signal. There is no late gadolinium enhancement in the left ventricular myocardium. 2. Normal right ventricular size with RVEDVI 68 mL/m2. Normal right ventricular thickness. Normal right ventricular systolic function (RVEF =35%). There are no regional wall motion abnormalities or aneurysms. 3.  Normal left and right atrial size. 4. Normal size of the aortic root and pulmonary artery. Evidence of mild ascending aortic dilation, 40 mm. 5. Valve assessment: Aortic Valve: Morphology not well seen. There is no significant regurgitation. Regurgitant fraction  6%. Gradient 2 mm Hg. Pulmonic Valve: There is no significant regurgitation. Regurgitant fraction 2%. Tricuspid Valve: There is no significant regurgitation. Regurgitant fraction 4%. Mitral Valve: Anterior mitral valve billowing without true prolapse. There is no significant regurgitation. Regurgitant fraction 12%. 6.  Normal pericardium.  No pericardial effusion. 7. Grossly, no extracardiac findings. Recommended dedicated study if concerned for non-cardiac pathology. IMPRESSION: Study meets imaging criteria for hypertrophic cardiomyopathy. Maximal hypertrophy 17 mm. No LGE No LVOT Obstruction No Apical aneurysm LVEF 66% Evidence of mild ascending aortic dilation, 40 mm. Consider secondary imaging modality (echocardiogram, CTA Aorta Protocol, MRA Aorta  Protocol) in one year if clinically indicated. Rudean Haskell MD Electronically Signed   By: Rudean Haskell M.D.   On: 11/26/2021 11:36    Assessment & Plan:   Patrick Floyd was seen today for hypertension, diabetes and hyperlipidemia.  Diagnoses and all orders for this visit:  Essential hypertension- His blood pressure is not adequately well controlled.  I have asked him to add an ARB. -     olmesartan (BENICAR) 20 MG tablet; Take 1 tablet (20 mg total) by mouth daily.  Type 2 diabetes mellitus without complication, without long-term current use of insulin (HCC) -     olmesartan (BENICAR) 20 MG tablet; Take 1 tablet (20 mg total) by mouth daily.  Hyperlipidemia with target LDL less than 100- I recommended that he take a statin for cardiovascular risk reduction. -     atorvastatin (LIPITOR) 20 MG tablet; Take 1 tablet (20 mg total) by mouth daily.  Current moderate episode of major depressive disorder without prior episode [facility])- He is not willing to take an antidepressant but agrees to see a therapist. -     Ambulatory referral to Psychology   I am having Patrick Floyd start on olmesartan and atorvastatin. I am also having him  maintain his VITAMIN D PO, metoprolol succinate, amLODipine, Mesalamine, metFORMIN, acetaminophen, and naproxen sodium.  Meds ordered this encounter  Medications   olmesartan (BENICAR) 20 MG tablet    Sig: Take 1 tablet (20 mg total) by mouth daily.    Dispense:  90 tablet    Refill:  0   atorvastatin (LIPITOR) 20 MG tablet    Sig: Take 1 tablet (20 mg total) by mouth daily.    Dispense:  90 tablet    Refill:  0     Follow-up: Return in about 3 months (around 04/21/2022).  Scarlette Calico, MD

## 2022-01-19 NOTE — Patient Instructions (Addendum)
[facility]  [phone]   Hypertension, Adult High blood pressure (hypertension) is when the force of blood pumping through the arteries is too strong. The arteries are the blood vessels that carry blood from the heart throughout the body. Hypertension forces the heart to work harder to pump blood and may cause arteries to become narrow or stiff. Untreated or uncontrolled hypertension can lead to a heart attack, heart failure, a stroke, kidney disease, and other problems. A blood pressure reading consists of a higher number over a lower number. Ideally, your blood pressure should be below 120/80. The first ("top") number is called the systolic pressure. It is a measure of the pressure in your arteries as your heart beats. The second ("bottom") number is called the diastolic pressure. It is a measure of the pressure in your arteries as the heart relaxes. What are the causes? The exact cause of this condition is not known. There are some conditions that result in high blood pressure. What increases the risk? Certain factors may make you more likely to develop high blood pressure. Some of these risk factors are under your control, including: Smoking. Not getting enough exercise or physical activity. Being overweight. Having too much fat, sugar, calories, or salt (sodium) in your diet. Drinking too much alcohol. Other risk factors include: Having a personal history of heart disease, diabetes, high cholesterol, or kidney disease. Stress. Having a family history of high blood pressure and high cholesterol. Having obstructive sleep apnea. Age. The risk increases with age. What are the signs or symptoms? High blood pressure may not cause symptoms. Very high blood pressure (hypertensive crisis) may cause: Headache. Fast or irregular heartbeats (palpitations). Shortness of breath. Nosebleed. Nausea and vomiting. Vision changes. Severe chest pain, dizziness, and seizures. How is this  diagnosed? This condition is diagnosed by measuring your blood pressure while you are seated, with your arm resting on a flat surface, your legs uncrossed, and your feet flat on the floor. The cuff of the blood pressure monitor will be placed directly against the skin of your upper arm at the level of your heart. Blood pressure should be measured at least twice using the same arm. Certain conditions can cause a difference in blood pressure between your right and left arms. If you have a high blood pressure reading during one visit or you have normal blood pressure with other risk factors, you may be asked to: Return on a different day to have your blood pressure checked again. Monitor your blood pressure at home for 1 week or longer. If you are diagnosed with hypertension, you may have other blood or imaging tests to help your health care provider understand your overall risk for other conditions. How is this treated? This condition is treated by making healthy lifestyle changes, such as eating healthy foods, exercising more, and reducing your alcohol intake. You may be referred for counseling on a healthy diet and physical activity. Your health care provider may prescribe medicine if lifestyle changes are not enough to get your blood pressure under control and if: Your systolic blood pressure is above 130. Your diastolic blood pressure is above 80. Your personal target blood pressure may vary depending on your medical conditions, your age, and other factors. Follow these instructions at home: Eating and drinking  Eat a diet that is high in fiber and potassium, and low in sodium, added sugar, and fat. An example of this eating plan is called the DASH diet. DASH stands for Dietary Approaches  to Stop Hypertension. To eat this way: Eat plenty of fresh fruits and vegetables. Try to fill one half of your plate at each meal with fruits and vegetables. Eat whole grains, such as whole-wheat pasta, brown  rice, or whole-grain bread. Fill about one fourth of your plate with whole grains. Eat or drink low-fat dairy products, such as skim milk or low-fat yogurt. Avoid fatty cuts of meat, processed or cured meats, and poultry with skin. Fill about one fourth of your plate with lean proteins, such as fish, chicken without skin, beans, eggs, or tofu. Avoid pre-made and processed foods. These tend to be higher in sodium, added sugar, and fat. Reduce your daily sodium intake. Many people with hypertension should eat less than 1,500 mg of sodium a day. Do not drink alcohol if: Your health care provider tells you not to drink. You are pregnant, may be pregnant, or are planning to become pregnant. If you drink alcohol: Limit how much you have to: 0-1 drink a day for women. 0-2 drinks a day for men. Know how much alcohol is in your drink. In the [place] one drink equals one 12 oz bottle of beer (355 mL), one 5 oz glass of wine (148 mL), or one 1 oz glass of hard liquor (44 mL). Lifestyle  Work with your health care provider to maintain a healthy body weight or to lose weight. Ask what an ideal weight is for you. Get at least 30 minutes of exercise that causes your heart to beat faster (aerobic exercise) most days of the week. Activities may include walking, swimming, or biking. Include exercise to strengthen your muscles (resistance exercise), such as Pilates or lifting weights, as part of your weekly exercise routine. Try to do these types of exercises for 30 minutes at least 3 days a week. Do not use any products that contain nicotine or tobacco. These products include cigarettes, chewing tobacco, and vaping devices, such as e-cigarettes. If you need help quitting, ask your health care provider. Monitor your blood pressure at home as told by your health care provider. Keep all follow-up visits. This is important. Medicines Take over-the-counter and prescription medicines only as told by your health care  provider. Follow directions carefully. Blood pressure medicines must be taken as prescribed. Do not skip doses of blood pressure medicine. Doing this puts you at risk for problems and can make the medicine less effective. Ask your health care provider about side effects or reactions to medicines that you should watch for. Contact a health care provider if you: Think you are having a reaction to a medicine you are taking. Have headaches that keep coming back (recurring). Feel dizzy. Have swelling in your ankles. Have trouble with your vision. Get help right away if you: Develop a severe headache or confusion. Have unusual weakness or numbness. Feel faint. Have severe pain in your chest or abdomen. Vomit repeatedly. Have trouble breathing. These symptoms may be an emergency. Get help right away. Call 911. Do not wait to see if the symptoms will go away. Do not drive yourself to the hospital. Summary Hypertension is when the force of blood pumping through your arteries is too strong. If this condition is not controlled, it may put you at risk for serious complications. Your personal target blood pressure may vary depending on your medical conditions, your age, and other factors. For most people, a normal blood pressure is less than 120/80. Hypertension is treated with lifestyle changes, medicines, or a combination of both.  Lifestyle changes include losing weight, eating a healthy, low-sodium diet, exercising more, and limiting alcohol. This information is not intended to replace advice given to you by your health care provider. Make sure you discuss any questions you have with your health care provider. Document Revised: 12/24/2020 Document Reviewed: 12/24/2020 Elsevier Patient Education  [place].

## 2022-01-26 ENCOUNTER — Ambulatory Visit (INDEPENDENT_AMBULATORY_CARE_PROVIDER_SITE_OTHER): Payer: Medicare HMO | Admitting: *Deleted

## 2022-01-26 DIAGNOSIS — Z0000 Encounter for general adult medical examination without abnormal findings: Secondary | ICD-10-CM | POA: Diagnosis not present

## 2022-01-26 NOTE — Progress Notes (Signed)
Subjective:   Patrick Floyd is a 76 y.o. male who presents for Medicare Annual/Subsequent preventive examination. I connected with  Patrick Floyd on 01/26/22 by a audio enabled telemedicine application and verified that I am speaking with the correct person using two identifiers.  Patient Location: Home  Provider Location: Home Office  I discussed the limitations of evaluation and management by telemedicine. The patient expressed understanding and agreed to proceed.  Review of Systems    Deferred to PCP Cardiac Risk Factors include: advanced age (>42mn, >>64women);diabetes mellitus;dyslipidemia;male gender;hypertension     Objective:    Today's Vitals   01/26/22 1450  PainSc: 3    There is no height or weight on file to calculate BMI.     01/26/2022    3:10 PM 06/27/2021    8:10 AM 02/10/2017    2:34 PM 02/18/2016    5:03 PM  Advanced Directives  Does Patient Have a Medical Advance Directive? Yes Yes Yes No  Type of AParamedicof AMedinaLiving will HPen MarLiving will    Does patient want to make changes to medical advance directive? No - Patient declined     Copy of [place] Chart? No - copy requested No - copy requested No - copy requested   Would patient like information on creating a medical advance directive?    No - Patient declined    Current Medications (verified) Outpatient Encounter Medications as of 01/26/2022  Medication Sig   acetaminophen (TYLENOL) 500 MG tablet Take 500 mg by mouth every 6 (six) hours as needed.   amLODipine (NORVASC) 10 MG tablet Take 1 tablet (10 mg total) by mouth daily.   atorvastatin (LIPITOR) 20 MG tablet Take 1 tablet (20 mg total) by mouth daily.   Mesalamine (ASACOL) 400 MG CPDR DR capsule Take 1 capsule (400 mg total) by mouth 3 (three) times daily.   metFORMIN (GLUCOPHAGE-XR) 500 MG 24 hr tablet Take 1 tablet (500 mg total) by mouth daily with  breakfast. TAKE 2 TABLETS DAILY WITH BREAKFAST (Patient taking differently: Take 500 mg by mouth daily with breakfast.)   metoprolol succinate (TOPROL-XL) 50 MG 24 hr tablet Take with or immediately following a meal.   naproxen sodium (ALEVE) 220 MG tablet Take 220 mg by mouth 2 (two) times daily as needed.   olmesartan (BENICAR) 20 MG tablet Take 1 tablet (20 mg total) by mouth daily.   VITAMIN D PO Take by mouth daily.   No facility-administered encounter medications on file as of 01/26/2022.    Allergies (verified) Patient has no known allergies.   History: Past Medical History:  Diagnosis Date   Diverticulosis of colon    GERD (gastroesophageal reflux disease)    watches diet   Hemorrhoids, internal    History of adenomatous polyp of colon    History of anal fissures    History of basal cell carcinoma    History of COVID-19 01/2021   per pt mild symptoms that resolved   History of primary hyperparathyroidism 01/2016   endocrinologist--- dr sKelton Pillar  s/p right superior parathyroidectomy 06/ 2018,  adenoma   Hyperlipidemia    Hypertension    Hypertrophic cardiomyopathy ([facility]    cardiologist--- dr sMarlou Porch  IBS (irritable bowel syndrome)    Inguinal hernia, bilateral    Nocturia    PONV (postoperative nausea and vomiting)    RBBB (right bundle branch block with left anterior fascicular block)  Seasonal allergies    Type 2 diabetes mellitus ([place])    followed by dr Kelton Pillar (endocrinologist);;  (06-25-2021 pt stated does not check blood sugar at home)   Ulcerative colitis, left sided [facility])    followed by dr stark (GI)   Umbilical hernia    Wears glasses    Past Surgical History:  Procedure Laterality Date   COLONOSCOPY WITH PROPOFOL  05/27/2020   by dr stark   INGUINAL HERNIA REPAIR Bilateral 06/27/2021   Procedure: LAPAROSCOPIC BILATERAL INGUINAL HERNIA REPAIR;  Surgeon: Michael Boston, MD;  Location: [place];  Service: General;  Laterality:  Bilateral;   MASS EXCISION Left 02/12/2017   Procedure: EXCISION 6x6 CM BACK MASS;  Surgeon: Johnathan Hausen, MD;  Location: [facility];  Service: General;  Laterality: Left;   PARATHYROIDECTOMY Right 08/20/2016   @[facility] ;  right superior   (adenoma)   UMBILICAL HERNIA REPAIR  06/27/2021   Procedure: PRIMARY UMBILICAL HERNIA REPAIR;  Surgeon: Michael Boston, MD;  Location: [place];  Service: General;;   Family History  Problem Relation Age of Onset   Hypertension Other    Breast cancer Mother    Heart disease Father    Hypertrophic cardiomyopathy Sister    Colon cancer Neg Hx    Stomach cancer Neg Hx    Cancer Neg Hx        Colon and Prostate   Hyperparathyroidism Neg Hx    Colon polyps Neg Hx    Esophageal cancer Neg Hx    Rectal cancer Neg Hx    Social History   Socioeconomic History   Marital status: Married    Spouse name: Patrick Floyd   Number of children: Not on file   Years of education: college   Highest education level: Not on file  Occupational History   Not on file  Tobacco Use   Smoking status: Former    Years: 5.00    Types: Cigarettes    Quit date: 12/05/1979    Years since quitting: 42.1   Smokeless tobacco: Never  Vaping Use   Vaping Use: Never used  Substance and Sexual Activity   Alcohol use: Not Currently    Comment: rare   Drug use: Never   Sexual activity: Not on file  Other Topics Concern   Not on file  Social History Narrative   Forensic psychologist, 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 Marriage is in good health. Work is usual amount of stress.    Social Determinants of Health   Financial Resource Strain: Low Risk  (01/26/2022)   Overall Financial Resource Strain (CARDIA)    Difficulty of Paying Living Expenses: Not hard at all  Food Insecurity: No Food Insecurity (01/26/2022)   Hunger Vital  Sign    Worried About Running Out of Food in the Last Year: Never true    Ran Out of Food in the Last Year: Never true  Transportation Needs: No Transportation Needs (01/26/2022)   PRAPARE - Hydrologist (Medical): No    Lack of Transportation (Non-Medical): No  Physical Activity: Insufficiently Active (01/26/2022)   Exercise Vital Sign    Days of Exercise per Week: 4 days    Minutes of Exercise per Session: 30 min  Stress: Stress Concern Present (01/26/2022)   [place]  Feeling of Stress : To some extent  Social Connections: Socially Integrated (01/26/2022)   Social Connection and Isolation Panel [NHANES]    Frequency of Communication with Friends and Family: More than three times a week    Frequency of Social Gatherings with Friends and Family: More than three times a week    Attends Religious Services: More than 4 times per year    Active Member of [employer] or Organizations: Yes    Attends Music therapist: More than 4 times per year    Marital Status: Married    Tobacco Counseling Counseling given: Not Answered   Clinical Intake:  Pre-visit preparation completed: Yes  Pain : 0-10 Pain Score: 3  Pain Type: Chronic pain Pain Location: Hip (back and hip) Pain Descriptors / Indicators: Aching, Discomfort, Dull Pain Relieving Factors: medication  Pain Relieving Factors: medication  Nutritional Status: BMI 25 -29 Overweight Nutritional Risks: None Diabetes: Yes CBG done?: No (phone visit) Did pt. bring in CBG monitor from home?: No (phone visit)  How often do you need to have someone help you when you read instructions, pamphlets, or other written materials from your doctor or pharmacy?: 1 - Never What is the last grade level you completed in school?: college  Diabetic?Yes Nutrition Risk Assessment:  Has the patient had any N/V/D within the last 2 months?  No   Does the patient have any non-healing wounds?  No  Has the patient had any unintentional weight loss or weight gain?  No   Diabetes:  Is the patient diabetic?  Yes  If diabetic, was a CBG obtained today?  No  Did the patient bring in their glucometer from home?  No  How often do you monitor your CBG's? Reports he controls his diabetes by diet and exercise. .   Financial Strains and Diabetes Management:  Are you having any financial strains with the device, your supplies or your medication? No .  Does the patient want to be seen by Chronic Care Management for management of their diabetes?  No  Would the patient like to be referred to a Nutritionist or for Diabetic Management?  No   Diabetic Exams:  Diabetic Eye Exam: Completed 07/21/21 Diabetic Foot Exam: Completed 06/25/21   Interpreter Needed?: No  Information entered by :: Emelia Loron RN   Activities of Daily Living    01/26/2022    2:58 PM 06/27/2021    8:12 AM  In your present state of health, do you have any difficulty performing the following activities:  Hearing? 0 0  Vision? 0 0  Difficulty concentrating or making decisions? 0 0  Walking or climbing stairs? 0 0  Dressing or bathing? 0 0  Doing errands, shopping? 0   Preparing Food and eating ? N   Using the Toilet? N   In the past six months, have you accidently leaked urine? N   Do you have problems with loss of bowel control? N   Managing your Medications? N   Managing your Finances? N   Housekeeping or managing your Housekeeping? N     Patient Care Team: Janith Lima, MD as PCP - General (Internal Medicine) Jerline Pain, MD as PCP - Cardiology (Cardiology) Michael Boston, MD as Consulting Physician (General Surgery)  Indicate any recent Medical Services you may have received from other than [facility] providers in the past year (date may be approximate).     Assessment:   This is a routine wellness examination for [place].  Hearing/Vision screen No  results found.  Dietary issues and exercise activities discussed: Current Exercise Habits: Home exercise routine, Type of exercise: walking, Time (Minutes): 35, Frequency (Times/Week): 4, Weekly Exercise (Minutes/Week): 140, Intensity: Mild, Exercise limited by: orthopedic condition(s)   Goals Addressed   None   Depression Screen    01/26/2022    2:55 PM 03/31/2021    8:56 AM 03/27/2020    8:01 AM 06/22/2018   10:32 AM 03/15/2017   10:44 AM 02/18/2016    5:04 PM  PHQ 2/9 Scores  PHQ - 2 Score 1 0 0 0  0  Exception Documentation     Medical reason     Fall Risk    01/26/2022    2:59 PM 03/31/2021    8:56 AM 03/27/2020    8:01 AM 06/22/2018   10:32 AM 02/18/2016    5:04 PM  [place] in the past year? 0 0 0 0 No  Number falls in past yr: 0   0   Injury with Fall? 0   0     FALL RISK PREVENTION PERTAINING TO THE HOME:  Any stairs in or around the home? Yes  If so, are there any without handrails? Yes  Home free of loose throw rugs in walkways, pet beds, electrical cords, etc? Yes  Adequate lighting in your home to reduce risk of falls? Yes   ASSISTIVE DEVICES UTILIZED TO PREVENT FALLS:  Life alert? No  Use of a cane, walker or w/c? No  Grab bars in the bathroom? No  Shower chair or bench in shower? No  Elevated toilet seat or a handicapped toilet? No   Cognitive Function:    01/26/2022    3:11 PM  MMSE - Mini Mental State Exam  Not completed: Refused        Immunizations Immunization History  Administered Date(s) Administered   COVID-19, mRNA, vaccine(Comirnaty)12 years and older 12/19/2021   Fluad Quad(high Dose 65+) 12/19/2018, 12/19/2021   Influenza Whole 11/13/2009   Influenza, High Dose Seasonal PF 11/30/2017, 01/03/2021, 12/16/2021   Influenza,inj,Quad PF,6+ Mos 12/14/2013   Influenza-Unspecified 12/24/2016, 12/05/2019, 01/03/2021   PFIZER Comirnaty(Gray Top)Covid-19 Tri-Sucrose Vaccine 12/10/2021   PFIZER(Purple Top)SARS-COV-2 Vaccination  03/30/2019, 04/21/2019, 12/16/2019, 06/21/2020   Pfizer Covid-19 Vaccine Bivalent Booster 5y-11y 12/26/2020   Pneumococcal Conjugate-13 02/17/2016   Pneumococcal Polysaccharide-23 03/27/2020   Td 06/12/1998   Tdap 02/17/2016   Zoster Recombinat (Shingrix) 04/04/2020, 10/01/2020   Zoster, Live 12/12/2010    Flu Vaccine status: Up to date  Pneumococcal vaccine status: Up to date  Covid-19 vaccine status: Completed vaccines  Qualifies for Shingles Vaccine? Yes   Zostavax completed No   Shingrix Completed?: Yes  Screening Tests Health Maintenance  Topic Date Due   COVID-19 Vaccine (7 - 2023-24 season) 02/04/2022   HEMOGLOBIN A1C  06/24/2022   FOOT EXAM  06/26/2022   OPHTHALMOLOGY EXAM  07/22/2022   Diabetic kidney evaluation - GFR measurement  12/24/2022   Diabetic kidney evaluation - Urine ACR  12/24/2022   Medicare Annual Wellness (AWV)  01/27/2023   COLONOSCOPY (Pts 45-65yr Insurance coverage will need to be confirmed)  05/28/2023   Pneumonia Vaccine 74 Years old  Completed   INFLUENZA VACCINE  Completed   Hepatitis C Screening  Completed   Zoster Vaccines- Shingrix  Completed   HPV VACCINES  Aged Out    Health Maintenance  There are no preventive care reminders to display for this patient.   Colorectal cancer screening: Type of  screening: Colonoscopy. Completed 05/27/20. Repeat every 3 years  Lung Cancer Screening: (Low Dose CT Chest recommended if Age 5-80 years, 30 pack-year currently smoking OR have quit w/in 15years.) does not qualify.   Additional Screening:  Hepatitis C Screening: does qualify; Completed 02/13/16  Vision Screening: Recommended annual ophthalmology exams for early detection of glaucoma and other disorders of the eye. Is the patient up to date with their annual eye exam?  Yes  Who is the provider or what is the name of the office in which the patient attends annual eye exams? [facility]  If pt is not established with a  provider, would they like to be referred to a provider to establish care?  N/A .   Dental Screening: Recommended annual dental exams for proper oral hygiene  Community Resource Referral / Chronic Care Management: CRR required this visit?  No   CCM required this visit?  No      Plan:     I have personally reviewed and noted the following in the patient's chart:   Medical and social history Use of alcohol, tobacco or illicit drugs  Current medications and supplements including opioid prescriptions. Patient is not currently taking opioid prescriptions. Functional ability and status Nutritional status Physical activity Advanced directives List of other physicians Hospitalizations, surgeries, and ER visits in previous 12 months Vitals Screenings to include cognitive, depression, and falls Referrals and appointments  In addition, I have reviewed and discussed with patient certain preventive protocols, quality metrics, and best practice recommendations. A written personalized care plan for preventive services as well as general preventive health recommendations were provided to patient.     Michiel Cowboy, RN   01/26/2022   Nurse Notes:  Mr. Royce , Thank you for taking time to come for your Medicare Wellness Visit. I appreciate your ongoing commitment to your health goals. Please review the following plan we discussed and let me know if I can assist you in the future.   These are the goals we discussed:  Goals   None     This is a list of the screening recommended for you and due dates:  Health Maintenance  Topic Date Due   COVID-19 Vaccine (7 - 2023-24 season) 02/04/2022   Hemoglobin A1C  06/24/2022   Complete foot exam   06/26/2022   Eye exam for diabetics  07/22/2022   Yearly kidney function blood test for diabetes  12/24/2022   Yearly kidney health urinalysis for diabetes  12/24/2022   Medicare Annual Wellness Visit  01/27/2023   Colon Cancer Screening  05/28/2023    Pneumonia Vaccine  Completed   Flu Shot  Completed   Hepatitis C Screening: USPSTF Recommendation to screen - Ages 18-79 yo.  Completed   Zoster (Shingles) Vaccine  Completed   HPV Vaccine  Aged Out

## 2022-01-26 NOTE — Patient Instructions (Signed)
Health Maintenance, Male Adopting a healthy lifestyle and getting preventive care are important in promoting health and wellness. Ask your health care provider about: The right schedule for you to have regular tests and exams. Things you can do on your own to prevent diseases and keep yourself healthy. What should I know about diet, weight, and exercise? Eat a healthy diet  Eat a diet that includes plenty of vegetables, fruits, low-fat dairy products, and lean protein. Do not eat a lot of foods that are high in solid fats, added sugars, or sodium. Maintain a healthy weight Body mass index (BMI) is a measurement that can be used to identify possible weight problems. It estimates body fat based on height and weight. Your health care provider can help determine your BMI and help you achieve or maintain a healthy weight. Get regular exercise Get regular exercise. This is one of the most important things you can do for your health. Most adults should: Exercise for at least 150 minutes each week. The exercise should increase your heart rate and make you sweat (moderate-intensity exercise). Do strengthening exercises at least twice a week. This is in addition to the moderate-intensity exercise. Spend less time sitting. Even light physical activity can be beneficial. Watch cholesterol and blood lipids Have your blood tested for lipids and cholesterol at 74 years of age, then have this test every 5 years. You may need to have your cholesterol levels checked more often if: Your lipid or cholesterol levels are high. You are older than 74 years of age. You are at high risk for heart disease. What should I know about cancer screening? Many types of cancers can be detected early and may often be prevented. Depending on your health history and family history, you may need to have cancer screening at various ages. This may include screening for: Colorectal cancer. Prostate cancer. Skin cancer. Lung  cancer. What should I know about heart disease, diabetes, and high blood pressure? Blood pressure and heart disease High blood pressure causes heart disease and increases the risk of stroke. This is more likely to develop in people who have high blood pressure readings or are overweight. Talk with your health care provider about your target blood pressure readings. Have your blood pressure checked: Every 3-5 years if you are 18-39 years of age. Every year if you are 40 years old or older. If you are between the ages of 65 and 75 and are a current or former smoker, ask your health care provider if you should have a one-time screening for abdominal aortic aneurysm (AAA). Diabetes Have regular diabetes screenings. This checks your fasting blood sugar level. Have the screening done: Once every three years after age 45 if you are at a normal weight and have a low risk for diabetes. More often and at a younger age if you are overweight or have a high risk for diabetes. What should I know about preventing infection? Hepatitis B If you have a higher risk for hepatitis B, you should be screened for this virus. Talk with your health care provider to find out if you are at risk for hepatitis B infection. Hepatitis C Blood testing is recommended for: Everyone born from 1945 through 1965. Anyone with known risk factors for hepatitis C. Sexually transmitted infections (STIs) You should be screened each year for STIs, including gonorrhea and chlamydia, if: You are sexually active and are younger than 74 years of age. You are older than 74 years of age and your   health care provider tells you that you are at risk for this type of infection. Your sexual activity has changed since you were last screened, and you are at increased risk for chlamydia or gonorrhea. Ask your health care provider if you are at risk. Ask your health care provider about whether you are at high risk for HIV. Your health care provider  may recommend a prescription medicine to help prevent HIV infection. If you choose to take medicine to prevent HIV, you should first get tested for HIV. You should then be tested every 3 months for as long as you are taking the medicine. Follow these instructions at home: Alcohol use Do not drink alcohol if your health care provider tells you not to drink. If you drink alcohol: Limit how much you have to 0-2 drinks a day. Know how much alcohol is in your drink. In the [place] one drink equals one 12 oz bottle of beer (355 mL), one 5 oz glass of Koralee Wedeking (148 mL), or one 1 oz glass of hard liquor (44 mL). Lifestyle Do not use any products that contain nicotine or tobacco. These products include cigarettes, chewing tobacco, and vaping devices, such as e-cigarettes. If you need help quitting, ask your health care provider. Do not use street drugs. Do not share needles. Ask your health care provider for help if you need support or information about quitting drugs. General instructions Schedule regular health, dental, and eye exams. Stay current with your vaccines. Tell your health care provider if: You often feel depressed. You have ever been abused or do not feel safe at home. Summary Adopting a healthy lifestyle and getting preventive care are important in promoting health and wellness. Follow your health care provider's instructions about healthy diet, exercising, and getting tested or screened for diseases. Follow your health care provider's instructions on monitoring your cholesterol and blood pressure. This information is not intended to replace advice given to you by your health care provider. Make sure you discuss any questions you have with your health care provider. Document Revised: 07/08/2020 Document Reviewed: 07/08/2020 Elsevier Patient Education  2023 [employer]  

## 2022-03-05 DIAGNOSIS — M25551 Pain in right hip: Secondary | ICD-10-CM | POA: Insufficient documentation

## 2022-03-10 ENCOUNTER — Telehealth: Payer: Self-pay | Admitting: Gastroenterology

## 2022-03-10 MED ORDER — MESALAMINE 400 MG PO CPDR: 400.0000 mg | DELAYED_RELEASE_CAPSULE | Freq: Three times a day (TID) | ORAL | 0 refills | Status: DC

## 2022-03-10 NOTE — Telephone Encounter (Signed)
Per patient, mesalamine has been sent to [facility] on [address].

## 2022-03-10 NOTE — Telephone Encounter (Signed)
Patient is calling wishing to have his Mesalamine sent to a different pharmacy, [facility] on [address]. Please advise

## 2022-03-12 DIAGNOSIS — M1611 Unilateral primary osteoarthritis, right hip: Secondary | ICD-10-CM | POA: Diagnosis not present

## 2022-03-12 DIAGNOSIS — M25551 Pain in right hip: Secondary | ICD-10-CM | POA: Diagnosis not present

## 2022-03-12 DIAGNOSIS — M542 Cervicalgia: Secondary | ICD-10-CM | POA: Diagnosis not present

## 2022-03-12 DIAGNOSIS — M545 Low back pain, unspecified: Secondary | ICD-10-CM | POA: Diagnosis not present

## 2022-03-13 ENCOUNTER — Other Ambulatory Visit: Payer: Self-pay | Admitting: Cardiology

## 2022-03-16 ENCOUNTER — Other Ambulatory Visit: Payer: Self-pay

## 2022-03-16 MED ORDER — METFORMIN HCL ER 500 MG PO TB24: 500.0000 mg | ORAL_TABLET | Freq: Every day | ORAL | 3 refills | Status: DC

## 2022-03-23 DIAGNOSIS — R69 Illness, unspecified: Secondary | ICD-10-CM | POA: Diagnosis not present

## 2022-04-20 DIAGNOSIS — R1031 Right lower quadrant pain: Secondary | ICD-10-CM | POA: Diagnosis not present

## 2022-04-20 DIAGNOSIS — M25551 Pain in right hip: Secondary | ICD-10-CM | POA: Diagnosis not present

## 2022-04-21 ENCOUNTER — Ambulatory Visit: Payer: Medicare HMO | Admitting: Internal Medicine

## 2022-04-23 DIAGNOSIS — M1611 Unilateral primary osteoarthritis, right hip: Secondary | ICD-10-CM | POA: Diagnosis not present

## 2022-04-23 DIAGNOSIS — M545 Low back pain, unspecified: Secondary | ICD-10-CM | POA: Diagnosis not present

## 2022-04-27 ENCOUNTER — Encounter: Payer: Self-pay | Admitting: Internal Medicine

## 2022-04-27 ENCOUNTER — Ambulatory Visit (INDEPENDENT_AMBULATORY_CARE_PROVIDER_SITE_OTHER): Payer: Medicare HMO | Admitting: Internal Medicine

## 2022-04-27 VITALS — BP 184/88 | HR 68 | Temp 97.8°F | Resp 16 | Ht 73.0 in | Wt 201.0 lb

## 2022-04-27 DIAGNOSIS — E213 Hyperparathyroidism, unspecified: Secondary | ICD-10-CM

## 2022-04-27 DIAGNOSIS — I10 Essential (primary) hypertension: Secondary | ICD-10-CM | POA: Diagnosis not present

## 2022-04-27 DIAGNOSIS — E118 Type 2 diabetes mellitus with unspecified complications: Secondary | ICD-10-CM

## 2022-04-27 DIAGNOSIS — R748 Abnormal levels of other serum enzymes: Secondary | ICD-10-CM

## 2022-04-27 DIAGNOSIS — Z0001 Encounter for general adult medical examination with abnormal findings: Secondary | ICD-10-CM

## 2022-04-27 DIAGNOSIS — N400 Enlarged prostate without lower urinary tract symptoms: Secondary | ICD-10-CM

## 2022-04-27 LAB — CBC WITH DIFFERENTIAL/PLATELET
Basophils Absolute: 0.1 10*3/uL (ref 0.0–0.1)
Basophils Relative: 0.8 % (ref 0.0–3.0)
Eosinophils Absolute: 0.1 10*3/uL (ref 0.0–0.7)
Eosinophils Relative: 1.9 % (ref 0.0–5.0)
HCT: 40.3 % (ref 39.0–52.0)
Hemoglobin: 14 g/dL (ref 13.0–17.0)
Lymphocytes Relative: 17.6 % (ref 12.0–46.0)
Lymphs Abs: 1.4 10*3/uL (ref 0.7–4.0)
MCHC: 34.8 g/dL (ref 30.0–36.0)
MCV: 83.9 fl (ref 78.0–100.0)
Monocytes Absolute: 0.6 10*3/uL (ref 0.1–1.0)
Monocytes Relative: 7.6 % (ref 3.0–12.0)
Neutro Abs: 5.6 10*3/uL (ref 1.4–7.7)
Neutrophils Relative %: 72.1 % (ref 43.0–77.0)
Platelets: 287 10*3/uL (ref 150.0–400.0)
RBC: 4.8 Mil/uL (ref 4.22–5.81)
RDW: 13.8 % (ref 11.5–15.5)
WBC: 7.7 10*3/uL (ref 4.0–10.5)

## 2022-04-27 LAB — PSA: PSA: 1.37 ng/mL (ref 0.10–4.00)

## 2022-04-27 LAB — BASIC METABOLIC PANEL
BUN: 23 mg/dL (ref 6–23)
CO2: 32 mEq/L (ref 19–32)
Calcium: 9.6 mg/dL (ref 8.4–10.5)
Chloride: 98 mEq/L (ref 96–112)
Creatinine, Ser: 0.82 mg/dL (ref 0.40–1.50)
GFR: 86.71 mL/min (ref >60.00)
Glucose, Bld: 119 mg/dL — ABNORMAL HIGH (ref 70–99)
Potassium: 3.7 mEq/L (ref 3.5–5.1)
Sodium: 140 mEq/L (ref 135–145)

## 2022-04-27 LAB — HEMOGLOBIN A1C: Hgb A1c MFr Bld: 6.4 % (ref 4.6–6.5)

## 2022-04-27 LAB — HEPATIC FUNCTION PANEL
ALT: 15 U/L (ref 0–53)
AST: 15 U/L (ref 0–37)
Albumin: 4.1 g/dL (ref 3.5–5.2)
Alkaline Phosphatase: 66 U/L (ref 39–117)
Bilirubin, Direct: 0.2 mg/dL (ref 0.0–0.3)
Total Bilirubin: 1.2 mg/dL (ref 0.2–1.2)
Total Protein: 6.8 g/dL (ref 6.0–8.3)

## 2022-04-27 LAB — TSH: TSH: 2.88 u[IU]/mL (ref 0.35–5.50)

## 2022-04-27 MED ORDER — INDAPAMIDE 1.25 MG PO TABS: 1.2500 mg | ORAL_TABLET | Freq: Every day | ORAL | 0 refills | Status: DC

## 2022-04-27 NOTE — Patient Instructions (Signed)
Health Maintenance, Male Adopting a healthy lifestyle and getting preventive care are important in promoting health and wellness. Ask your health care provider about: The right schedule for you to have regular tests and exams. Things you can do on your own to prevent diseases and keep yourself healthy. What should I know about diet, weight, and exercise? Eat a healthy diet  Eat a diet that includes plenty of vegetables, fruits, low-fat dairy products, and lean protein. Do not eat a lot of foods that are high in solid fats, added sugars, or sodium. Maintain a healthy weight Body mass index (BMI) is a measurement that can be used to identify possible weight problems. It estimates body fat based on height and weight. Your health care provider can help determine your BMI and help you achieve or maintain a healthy weight. Get regular exercise Get regular exercise. This is one of the most important things you can do for your health. Most adults should: Exercise for at least 150 minutes each week. The exercise should increase your heart rate and make you sweat (moderate-intensity exercise). Do strengthening exercises at least twice a week. This is in addition to the moderate-intensity exercise. Spend less time sitting. Even light physical activity can be beneficial. Watch cholesterol and blood lipids Have your blood tested for lipids and cholesterol at 75 years of age, then have this test every 5 years. You may need to have your cholesterol levels checked more often if: Your lipid or cholesterol levels are high. You are older than 75 years of age. You are at high risk for heart disease. What should I know about cancer screening? Many types of cancers can be detected early and may often be prevented. Depending on your health history and family history, you may need to have cancer screening at various ages. This may include screening for: Colorectal cancer. Prostate cancer. Skin cancer. Lung  cancer. What should I know about heart disease, diabetes, and high blood pressure? Blood pressure and heart disease High blood pressure causes heart disease and increases the risk of stroke. This is more likely to develop in people who have high blood pressure readings or are overweight. Talk with your health care provider about your target blood pressure readings. Have your blood pressure checked: Every 3-5 years if you are 18-39 years of age. Every year if you are 40 years old or older. If you are between the ages of 65 and 75 and are a current or former smoker, ask your health care provider if you should have a one-time screening for abdominal aortic aneurysm (AAA). Diabetes Have regular diabetes screenings. This checks your fasting blood sugar level. Have the screening done: Once every three years after age 45 if you are at a normal weight and have a low risk for diabetes. More often and at a younger age if you are overweight or have a high risk for diabetes. What should I know about preventing infection? Hepatitis B If you have a higher risk for hepatitis B, you should be screened for this virus. Talk with your health care provider to find out if you are at risk for hepatitis B infection. Hepatitis C Blood testing is recommended for: Everyone born from 1945 through 1965. Anyone with known risk factors for hepatitis C. Sexually transmitted infections (STIs) You should be screened each year for STIs, including gonorrhea and chlamydia, if: You are sexually active and are younger than 75 years of age. You are older than 75 years of age and your   health care provider tells you that you are at risk for this type of infection. Your sexual activity has changed since you were last screened, and you are at increased risk for chlamydia or gonorrhea. Ask your health care provider if you are at risk. Ask your health care provider about whether you are at high risk for HIV. Your health care provider  may recommend a prescription medicine to help prevent HIV infection. If you choose to take medicine to prevent HIV, you should first get tested for HIV. You should then be tested every 3 months for as long as you are taking the medicine. Follow these instructions at home: Alcohol use Do not drink alcohol if your health care provider tells you not to drink. If you drink alcohol: Limit how much you have to 0-2 drinks a day. Know how much alcohol is in your drink. In the [place] one drink equals one 12 oz bottle of beer (355 mL), one 5 oz glass of wine (148 mL), or one 1 oz glass of hard liquor (44 mL). Lifestyle Do not use any products that contain nicotine or tobacco. These products include cigarettes, chewing tobacco, and vaping devices, such as e-cigarettes. If you need help quitting, ask your health care provider. Do not use street drugs. Do not share needles. Ask your health care provider for help if you need support or information about quitting drugs. General instructions Schedule regular health, dental, and eye exams. Stay current with your vaccines. Tell your health care provider if: You often feel depressed. You have ever been abused or do not feel safe at home. Summary Adopting a healthy lifestyle and getting preventive care are important in promoting health and wellness. Follow your health care provider's instructions about healthy diet, exercising, and getting tested or screened for diseases. Follow your health care provider's instructions on monitoring your cholesterol and blood pressure. This information is not intended to replace advice given to you by your health care provider. Make sure you discuss any questions you have with your health care provider. Document Revised: 07/08/2020 Document Reviewed: 07/08/2020 Else[employer] Patient Education  2023 [employer]  

## 2022-04-27 NOTE — Progress Notes (Signed)
Subjective:  Patient ID: Patrick Floyd, male    DOB: Dec 05, 1945  Age: 77 y.o. MRN: [id number]  CC: Annual Exam, Hypertension, Diabetes, and Hyperlipidemia   HPI DAVUD HASELHUHN presents for a CPX and f/up ----  He is not taking the ARB because it caused dizziness.  He is active and denies chest pain, shortness of breath, diaphoresis, or edema.  Outpatient Medications Prior to Visit  Medication Sig Dispense Refill   acetaminophen (TYLENOL) 500 MG tablet Take 500 mg by mouth every 6 (six) hours as needed.     amLODipine (NORVASC) 10 MG tablet TAKE 1 TABLET BY MOUTH EVERY DAY 90 tablet 3   atorvastatin (LIPITOR) 20 MG tablet Take 1 tablet (20 mg total) by mouth daily. 90 tablet 0   Mesalamine (ASACOL) 400 MG CPDR DR capsule Take 1 capsule (400 mg total) by mouth 3 (three) times daily. 270 capsule 0   metFORMIN (GLUCOPHAGE-XR) 500 MG 24 hr tablet Take 1 tablet (500 mg total) by mouth daily with breakfast. 90 tablet 3   metoprolol succinate (TOPROL-XL) 50 MG 24 hr tablet TAKE WITH OR IMMEDIATELY FOLLOWING A MEAL EVERY DAY 90 tablet 3   naproxen sodium (ALEVE) 220 MG tablet Take 220 mg by mouth 2 (two) times daily as needed.     olmesartan (BENICAR) 20 MG tablet Take 1 tablet (20 mg total) by mouth daily. 90 tablet 0   VITAMIN D PO Take by mouth daily.     No facility-administered medications prior to visit.    ROS Review of Systems  Constitutional: Negative.  Negative for chills, diaphoresis, fatigue and fever.  HENT: Negative.    Eyes: Negative.   Respiratory:  Negative for cough, chest tightness, shortness of breath and wheezing.   Cardiovascular:  Negative for chest pain, palpitations and leg swelling.  Gastrointestinal:  Negative for abdominal pain, constipation, diarrhea, nausea and vomiting.  Endocrine: Negative.   Genitourinary:  Positive for difficulty urinating. Negative for dysuria, hematuria, testicular pain and urgency.       Nocturia   Musculoskeletal:  Positive  for arthralgias. Negative for back pain and myalgias.  Skin: Negative.   Neurological:  Positive for dizziness. Negative for weakness, light-headedness, numbness and headaches.  Hematological:  Negative for adenopathy. Does not bruise/bleed easily.  Psychiatric/Behavioral: Negative.      Objective:  BP (!) 184/88   Pulse 68   Temp 97.8 F (36.6 C) (Oral)   Resp 16   Ht '6\' 1"'$  (1.854 m)   Wt 201 lb (91.2 kg)   SpO2 99%   BMI 26.52 kg/m   BP Readings from Last 3 Encounters:  04/27/22 (!) 184/88  01/19/22 (!) 156/84  12/23/21 (!) 140/82    Wt Readings from Last 3 Encounters:  04/27/22 201 lb (91.2 kg)  01/19/22 200 lb (90.7 kg)  12/23/21 197 lb (89.4 kg)    Physical Exam Vitals reviewed.  Constitutional:      Appearance: He is not ill-appearing.  HENT:     Nose: Nose normal.     Mouth/Throat:     Mouth: Mucous membranes are moist.  Eyes:     General: No scleral icterus.    Conjunctiva/sclera: Conjunctivae normal.  Cardiovascular:     Rate and Rhythm: Normal rate and regular rhythm.     Heart sounds: S1 normal. Murmur heard.     Systolic murmur is present with a grade of 1/6.     No friction rub. No gallop.  Pulmonary:  Effort: Pulmonary effort is normal.     Breath sounds: No stridor. No wheezing, rhonchi or rales.  Abdominal:     General: Abdomen is flat.     Palpations: There is no mass.     Tenderness: There is no abdominal tenderness. There is no guarding or rebound.     Hernia: No hernia is present. There is no hernia in the left inguinal area or right inguinal area.  Genitourinary:    Pubic Area: No rash.      Penis: Normal and circumcised.      Testes: Normal.     Epididymis:     Right: Normal.     Left: Normal.     Prostate: Enlarged. Not tender and no nodules present.     Rectum: Normal. Guaiac result negative. No mass, tenderness, anal fissure, external hemorrhoid or internal hemorrhoid. Normal anal tone.  Musculoskeletal:     Cervical back:  Neck supple.     Right lower leg: Edema (trace pitting) present.     Left lower leg: Edema (trace pitting) present.  Lymphadenopathy:     Cervical: No cervical adenopathy.     Lower Body: No right inguinal adenopathy. No left inguinal adenopathy.  Skin:    General: Skin is warm and dry.     Findings: No rash.  Neurological:     General: No focal deficit present.     Mental Status: He is alert. Mental status is at baseline.  Psychiatric:        Mood and Affect: Mood normal.        Behavior: Behavior normal.     Lab Results  Component Value Date   WBC 7.7 04/27/2022   HGB 14.0 04/27/2022   HCT 40.3 04/27/2022   PLT 287.0 04/27/2022   GLUCOSE 119 (H) 04/27/2022   CHOL 206 (H) 12/23/2021   TRIG 101.0 12/23/2021   HDL 49.10 12/23/2021   LDLDIRECT 166.7 08/24/2012   LDLCALC 137 (H) 12/23/2021   ALT 15 04/27/2022   AST 15 04/27/2022   NA 140 04/27/2022   K 3.7 04/27/2022   CL 98 04/27/2022   CREATININE 0.82 04/27/2022   BUN 23 04/27/2022   CO2 32 04/27/2022   TSH 2.88 04/27/2022   PSA 1.37 04/27/2022   HGBA1C 6.4 04/27/2022   MICROALBUR 11.8 (H) 12/23/2021    MR CARDIAC MORPHOLOGY W WO CONTRAST  Result Date: 11/26/2021 CLINICAL DATA:  Clinical question of hypertrophic cardiomyopathy Study assumes BSA of 2.16 m2. EXAM: CARDIAC MRI TECHNIQUE: The patient was scanned on a 1.5 Tesla GE magnet. A dedicated cardiac coil was used. Functional imaging was done using Fiesta sequences. 2,3, and 4 chamber views were done to assess for RWMA's. Modified Simpson's rule using a short axis stack was used to calculate an ejection fraction on a dedicated work Conservation officer, nature. The patient received 12 cc of Gadavist. After 10 minutes inversion recovery sequences were used to assess for infiltration and scar tissue. Velocity encoding sequences performed for valve assessment. Patient needed to leave and return during CMR. Native T1 will be used instead of ECV analysis. CONTRAST:  12 cc   of Gadavist FINDINGS: 1. Normal left ventricular size, with LVEDD 41 mm, and LVEDVi 68 mL/m2. Severe asymmetric septal hypertrophy, septal thickness of 17 mm, and myocardial mass index of 57 g/m2. Normal left ventricular systolic function (LVEF [id number]). There are no regional wall motion abnormalities. No systemic motion of the anterior mitral valve suggestive of supine LVOT obstruction. Left ventricular  parametric mapping notable for normal native T1 signal and native T2 signal. There is no late gadolinium enhancement in the left ventricular myocardium. 2. Normal right ventricular size with RVEDVI 68 mL/m2. Normal right ventricular thickness. Normal right ventricular systolic function (RVEF 123XX123). There are no regional wall motion abnormalities or aneurysms. 3.  Normal left and right atrial size. 4. Normal size of the aortic root and pulmonary artery. Evidence of mild ascending aortic dilation, 40 mm. 5. Valve assessment: Aortic Valve: Morphology not well seen. There is no significant regurgitation. Regurgitant fraction 6%. Gradient 2 mm Hg. Pulmonic Valve: There is no significant regurgitation. Regurgitant fraction 2%. Tricuspid Valve: There is no significant regurgitation. Regurgitant fraction 4%. Mitral Valve: Anterior mitral valve billowing without true prolapse. There is no significant regurgitation. Regurgitant fraction 12%. 6.  Normal pericardium.  No pericardial effusion. 7. Grossly, no extracardiac findings. Recommended dedicated study if concerned for non-cardiac pathology. IMPRESSION: Study meets imaging criteria for hypertrophic cardiomyopathy. Maximal hypertrophy 17 mm. No LGE No LVOT Obstruction No Apical aneurysm LVEF 66% Evidence of mild ascending aortic dilation, 40 mm. Consider secondary imaging modality (echocardiogram, CTA Aorta Protocol, MRA Aorta Protocol) in one year if clinically indicated. Rudean Haskell MD Electronically Signed   By: Rudean Haskell M.D.   On: 11/26/2021 11:36    MR CARDIAC VELOCITY FLOW MAP  Result Date: 11/26/2021 CLINICAL DATA:  Clinical question of hypertrophic cardiomyopathy Study assumes BSA of 2.16 m2. EXAM: CARDIAC MRI TECHNIQUE: The patient was scanned on a 1.5 Tesla GE magnet. A dedicated cardiac coil was used. Functional imaging was done using Fiesta sequences. 2,3, and 4 chamber views were done to assess for RWMA's. Modified Simpson's rule using a short axis stack was used to calculate an ejection fraction on a dedicated work Conservation officer, nature. The patient received 12 cc of Gadavist. After 10 minutes inversion recovery sequences were used to assess for infiltration and scar tissue. Velocity encoding sequences performed for valve assessment. Patient needed to leave and return during CMR. Native T1 will be used instead of ECV analysis. CONTRAST:  12 cc  of Gadavist FINDINGS: 1. Normal left ventricular size, with LVEDD 41 mm, and LVEDVi 68 mL/m2. Severe asymmetric septal hypertrophy, septal thickness of 17 mm, and myocardial mass index of 57 g/m2. Normal left ventricular systolic function (LVEF Q000111Q). There are no regional wall motion abnormalities. No systemic motion of the anterior mitral valve suggestive of supine LVOT obstruction. Left ventricular parametric mapping notable for normal native T1 signal and native T2 signal. There is no late gadolinium enhancement in the left ventricular myocardium. 2. Normal right ventricular size with RVEDVI 68 mL/m2. Normal right ventricular thickness. Normal right ventricular systolic function (RVEF 123XX123). There are no regional wall motion abnormalities or aneurysms. 3.  Normal left and right atrial size. 4. Normal size of the aortic root and pulmonary artery. Evidence of mild ascending aortic dilation, 40 mm. 5. Valve assessment: Aortic Valve: Morphology not well seen. There is no significant regurgitation. Regurgitant fraction 6%. Gradient 2 mm Hg. Pulmonic Valve: There is no significant regurgitation.  Regurgitant fraction 2%. Tricuspid Valve: There is no significant regurgitation. Regurgitant fraction 4%. Mitral Valve: Anterior mitral valve billowing without true prolapse. There is no significant regurgitation. Regurgitant fraction 12%. 6.  Normal pericardium.  No pericardial effusion. 7. Grossly, no extracardiac findings. Recommended dedicated study if concerned for non-cardiac pathology. IMPRESSION: Study meets imaging criteria for hypertrophic cardiomyopathy. Maximal hypertrophy 17 mm. No LGE No LVOT Obstruction No Apical aneurysm LVEF  66% Evidence of mild ascending aortic dilation, 40 mm. Consider secondary imaging modality (echocardiogram, CTA Aorta Protocol, MRA Aorta Protocol) in one year if clinically indicated. Rudean Haskell MD Electronically Signed   By: Rudean Haskell M.D.   On: 11/26/2021 11:36    Assessment & Plan:   Sherard was seen today for annual exam, hypertension, diabetes and hyperlipidemia.  Diagnoses and all orders for this visit:  Essential hypertension- His blood pressure is not well-controlled.  I have asked him to be more compliant with the ARB and will add a thiazide diuretic. -     Basic metabolic panel; Future -     CBC with Differential/Platelet; Future -     TSH; Future -     Aldosterone + renin activity w/ ratio; Future -     Aldosterone + renin activity w/ ratio -     TSH -     CBC with Differential/Platelet -     Basic metabolic panel -     indapamide (LOZOL) 1.25 MG tablet; Take 1 tablet (1.25 mg total) by mouth daily.  Benign prostatic hyperplasia without lower urinary tract symptoms- His PSA is normal. -     PSA; Future -     PSA  Alkaline phosphatase elevation -     Hepatic function panel; Future -     Hepatic function panel  Type II diabetes mellitus with manifestations ([place])- His blood sugar is adequately well-controlled. -     Hemoglobin A1c; Future -     Basic metabolic panel; Future -     Basic metabolic panel -      Hemoglobin A1c -     HM Diabetes Foot Exam  Encounter for general adult medical examination with abnormal findings- Exam completed, labs reviewed, vaccines reviewed and updated, cancer screenings are up-to-date, patient education was given.  Hyperparathyroidism ([place])- His calcium is normal now. -     PTH, intact and calcium; Future -     PTH, intact and calcium   I am having [name]. Deaton start on indapamide. I am also having him maintain his VITAMIN D PO, acetaminophen, naproxen sodium, olmesartan, atorvastatin, Mesalamine, metoprolol succinate, amLODipine, and metFORMIN.  Meds ordered this encounter  Medications   indapamide (LOZOL) 1.25 MG tablet    Sig: Take 1 tablet (1.25 mg total) by mouth daily.    Dispense:  90 tablet    Refill:  0     Follow-up: Return in about 6 weeks (around 06/08/2022).  Scarlette Calico, MD

## 2022-04-29 DIAGNOSIS — M25551 Pain in right hip: Secondary | ICD-10-CM | POA: Diagnosis not present

## 2022-05-02 LAB — ALDOSTERONE + RENIN ACTIVITY W/ RATIO
ALDO / PRA Ratio: 75 Ratio — ABNORMAL HIGH (ref 0.9–28.9)
Aldosterone: 6 ng/dL
Renin Activity: 0.08 ng/mL/h — ABNORMAL LOW (ref 0.25–5.82)

## 2022-05-02 LAB — PTH, INTACT AND CALCIUM
Calcium: 9.3 mg/dL (ref 8.6–10.3)
PTH: 43 pg/mL (ref 16–77)

## 2022-05-06 ENCOUNTER — Other Ambulatory Visit: Payer: Self-pay | Admitting: Internal Medicine

## 2022-05-06 ENCOUNTER — Encounter: Payer: Self-pay | Admitting: Internal Medicine

## 2022-05-11 ENCOUNTER — Telehealth: Payer: Self-pay | Admitting: Cardiology

## 2022-05-11 DIAGNOSIS — I10 Essential (primary) hypertension: Secondary | ICD-10-CM

## 2022-05-11 NOTE — Telephone Encounter (Signed)
Pt stated 2 weeks he had an OV with is PCP and his blood pressure was elevated. PCP added two hypertension medications olmseartan and indapamide. Patient stated for the past couple of days he has felt like "crap" and his mind is cloudy. Denies any other symptoms. On 3/10 pt decided to d/c'd indapamide and olmesartan due to current symptoms. He does have concerns that he is currently taking too may medications pertaining to treating hypertension. Bp is morning 152/79. Will forward to MD and nurse.

## 2022-05-11 NOTE — Telephone Encounter (Signed)
Per pt - at last office visit with his PCP, he was started on to new medications.  Actually, he was supposed to have been taking Olmesartan 20 mg,  but had not been and was instructed to restart it along with starting indapamide 1.25 mg daily.  He did as instructed but reports feeling tired and cloudy headed.  BPs have been between 150-160/70-90 since.  D/t feeling poorly and not seeing an improvement in his BP, pt discontinued these 2 medications yesterday.  He feels as if he should not have to take more than one medication to control his BP because he only has to take one medication for his DM, vit D deficiency and colitis and they are under control.  Advised pt somettimes one medication is not enough to manage a pt's BP completely and not infrequently, we see pt's on several medications for control.  Pt would like Dr Marlou Porch to determine which medications he needs for his BP and if several - why.  Pt is currently taking Metoprolol and Amlodipine as instructed.

## 2022-05-11 NOTE — Telephone Encounter (Signed)
Pt sent this via Mychart to the scheduling pool:    Last 5 BP readings: 156/86, 148/81, 156/89, 150/74, 152/98. Since adding the 2 meds I'm feeling tired, a little cloudy head, legs feel weaker than normal. Was feeling a little of that before, meds made it worse. I really think my BP meds need to be reviewed, taking 4 of them now and they are not helping my readings or how I feel.    Good Morning Lelie,   Can you tell me a little more about what is going on with your BP so I can it over to a nurse    1. What are your last 5 BP readings?   2. Are you having any other symptoms (ex. Dizziness, headache, blurred vision, passed out)?   3. What is your BP issue?      Appointment Request From: [name]  With Provider: Candee Furbish, MD [facility]  Preferred Date Range: 05/11/2022 - 05/15/2022  Preferred Times: Any Time  Reason for visit: Office Visit  Comments: My blood pressure is high and not feeling well after a week of taking 2 additional BP meds (Now taking 4 BP meds after Dr. Ronnald Ramp added Olmesartan and Indapamide). I also am taking Aleve 2 times a day for hip pain. Would like to get checked as soon as possible. Thank you.

## 2022-05-15 NOTE — Telephone Encounter (Signed)
Jerline Pain, MD    05/14/22  3:13 PM Lets have him talk with our hypertension clinic to help guide him with blood pressure medication. Candee Furbish, MD  Pt is aware he will be contacted to b scheduled in the HTN clinic.

## 2022-05-19 ENCOUNTER — Encounter: Payer: Self-pay | Admitting: Student

## 2022-05-19 ENCOUNTER — Ambulatory Visit: Payer: Medicare HMO | Attending: Cardiovascular Disease | Admitting: Student

## 2022-05-19 VITALS — BP 168/80 | HR 85

## 2022-05-19 DIAGNOSIS — I10 Essential (primary) hypertension: Secondary | ICD-10-CM

## 2022-05-19 DIAGNOSIS — E785 Hyperlipidemia, unspecified: Secondary | ICD-10-CM | POA: Diagnosis not present

## 2022-05-19 MED ORDER — OLMESARTAN MEDOXOMIL 5 MG PO TABS: 10.0000 mg | ORAL_TABLET | Freq: Every day | ORAL | 3 refills | Status: DC

## 2022-05-19 MED ORDER — ROSUVASTATIN CALCIUM 20 MG PO TABS: 20.0000 mg | ORAL_TABLET | Freq: Every day | ORAL | 3 refills | Status: DC

## 2022-05-19 NOTE — Assessment & Plan Note (Signed)
Assessment:  LDL goal: < 100 mg/dl last LDLc 137 mg/dl (12/23/2021) Not on any statins  Was prescribed atorvastatin - due to mild chest pain d/c therapy  Discussed significance of achieving LDLc goal with other co-morbidities and benefits of statins   Plan: Start rosuvastatin 20 mg daily  If not tolerated will reduce dose to 10 mg at 2 weeks follow up via phone  Fasting Lipid  and LFT lab due in 3 months of starting rosuvastatin 20 mg

## 2022-05-19 NOTE — Progress Notes (Unsigned)
Patient ID: JABRIL YELL                 DOB: 07-18-1945                      MRN: [id number]      HPI: Patrick Floyd is a 77 y.o. male referred by Dr. Marlou Porch to HTN clinic. PMH is significant for hypertrophic cardiomyopathy, T2DM, ulcerative colitis, hyperlipidemia.  Patient presented today for BP follow up. Home BP ~150-160/80-90. Bought new wrist cuff. Patient was put on indapamide and olmesartan by PCP in Feb. He did not liked those medications as they were making him tired and he was confused while on the therapy. He did agreed to the idea of starting 2 medications at once.  He tend to gets anxious at the doctors visits he has white coat syndrome. He also had stopped taking atorvastatin he took for one day and he stopped due to mild chest pain. He has lost 43 lbs in past 4 years with healthy eating and regular exercise. Lately his hp is bothering a lot so he is unable to walk long distances  Current mediations: amlodipine 10 mg daily, Toprol XL 50 mg daily Previously tried mediations: olmesartan, indapamide  BP goal: <130/80   Family History:  Mother- heart attack  Father - HTN, heart attack  Siblings- colitis, cardiomyopathy    Social History:  Alcohol: occasional 1-2 drinks per week Smoking: quit 42 years ago   Diet: eats home cooked meals most days of the week eats out once or twice week can reduce that to every other day.  Advised to cut down on the salt intake     Exercise: walking 2 miles  3-4 times per week lately unable to walk due to hip pain    Home BP readings: 150-160/80-90   Lab Results  Component Value Date   CHOL 206 (H) 12/23/2021   HDL 49.10 12/23/2021   LDLCALC 137 (H) 12/23/2021   LDLDIRECT 166.7 08/24/2012   TRIG 101.0 12/23/2021   CHOLHDL 4 12/23/2021     Wt Readings from Last 3 Encounters:  04/27/22 201 lb (91.2 kg)  01/19/22 200 lb (90.7 kg)  12/23/21 197 lb (89.4 kg)   BP Readings from Last 3 Encounters:  05/19/22 (!) 168/80   04/27/22 (!) 184/88  01/19/22 (!) 156/84   Pulse Readings from Last 3 Encounters:  05/19/22 85  04/27/22 68  01/19/22 61    Renal function: CrCl cannot be calculated (Patient's most recent lab result is older than the maximum 21 days allowed.).  Past Medical History:  Diagnosis Date   Diverticulosis of colon    GERD (gastroesophageal reflux disease)    watches diet   Hemorrhoids, internal    History of adenomatous polyp of colon    History of anal fissures    History of basal cell carcinoma    History of COVID-19 01/2021   per pt mild symptoms that resolved   History of primary hyperparathyroidism 01/2016   endocrinologist--- dr Kelton Pillar;  s/p right superior parathyroidectomy 06/ 2018,  adenoma   Hyperlipidemia    Hypertension    Hypertrophic cardiomyopathy [facility])    cardiologist--- dr Marlou Porch   IBS (irritable bowel syndrome)    Inguinal hernia, bilateral    Nocturia    PONV (postoperative nausea and vomiting)    RBBB (right bundle branch block with left anterior fascicular block)    Seasonal allergies    Type 2 diabetes  mellitus ([place])    followed by dr Kelton Pillar (endocrinologist);;  (06-25-2021 pt stated does not check blood sugar at home)   Ulcerative colitis, left sided [facility])    followed by dr stark (GI)   Umbilical hernia    Wears glasses     Current Outpatient Medications on File Prior to Visit  Medication Sig Dispense Refill   amLODipine (NORVASC) 10 MG tablet TAKE 1 TABLET BY MOUTH EVERY DAY 90 tablet 3   Mesalamine (ASACOL) 400 MG CPDR DR capsule Take 1 capsule (400 mg total) by mouth 3 (three) times daily. 270 capsule 0   metFORMIN (GLUCOPHAGE-XR) 500 MG 24 hr tablet Take 1 tablet (500 mg total) by mouth daily with breakfast. 90 tablet 3   metoprolol succinate (TOPROL-XL) 50 MG 24 hr tablet TAKE WITH OR IMMEDIATELY FOLLOWING A MEAL EVERY DAY 90 tablet 3   VITAMIN D PO Take by mouth daily.     acetaminophen (TYLENOL) 500 MG tablet Take 500 mg by mouth  every 6 (six) hours as needed.     atorvastatin (LIPITOR) 20 MG tablet Take 1 tablet (20 mg total) by mouth daily. (Patient not taking: Reported on 05/19/2022) 90 tablet 0   naproxen sodium (ALEVE) 220 MG tablet Take 220 mg by mouth 2 (two) times daily as needed.     No current facility-administered medications on file prior to visit.    No Known Allergies  Blood pressure (!) 168/80, pulse 85, SpO2 97 %.   Assessment/Plan:  1. Hypertension -  Essential hypertension Assessment: BP is uncontrolled in office BP 178/80 mmHg upon repeating 168/80  with HR 85  Home BP on wrist cuff ~155/85 with heart rate 85  Patient reports he has white coat syndrome  Tolerates current BP medications well without any side effects Denies SOB, palpitation, chest pain, headaches,or swelling Reiterated the importance of regular exercise and low salt diet  To improve tolerability patient is in agreement to start olmesartan 10 mg   Plan:  Start taking olmesartan 10 mg daily ( 1/2 tablet of 20 mg- will use up home supply) Continue taking amlodipine 10 mg and Toprol Xl 50 mg daily  Patient to keep record of BP readings with heart rate and report to [place] at the next visit Patient to bring BP cuff for validation in 2 weeks  Patient to see PharmD in 4 weeks for follow up  Follow up lab(s): BMP in 2 weeks    Hyperlipidemia with target LDL less than 100 Assessment:  LDL goal: < 100 mg/dl last LDLc 137 mg/dl (12/23/2021) Not on any statins  Was prescribed atorvastatin - due to mild chest pain d/c therapy  Discussed significance of achieving LDLc goal with other co-morbidities and benefits of statins   Plan: Start rosuvastatin 20 mg daily  If not tolerated will reduce dose to 10 mg at 2 weeks follow up via phone  Fasting Lipid  and LFT lab due in 3 months of starting rosuvastatin 20 mg     Thank you  Cammy Copa, Pharm.D [facility] A Division of [facility] [address], [place], [place] [address]  Phone: [phone]; Fax: [phone]

## 2022-05-19 NOTE — Patient Instructions (Addendum)
Changes made by your pharmacist Cammy Copa, PharmD at today's visit:    Instructions/Changes  (what do you need to do) Your Notes  (what you did and when you did it)  Start olmesartan 10 mg daily    2.   Continue taking amlodipine 10mg , metoprolol xl 50 mg daily    3.  Start taking Tylenol arthritis instead of Aleve. try to use aleve sparingly     Bring all of your meds, your BP cuff and your record of home blood pressures to your next appointment.    HOW TO TAKE YOUR BLOOD PRESSURE AT HOME  Rest 5 minutes before taking your blood pressure.  Don't smoke or drink caffeinated beverages for at least 30 minutes before. Take your blood pressure before (not after) you eat. Sit comfortably with your back supported and both feet on the floor (don't cross your legs). Elevate your arm to heart level on a table or a desk. Use the proper sized cuff. It should fit smoothly and snugly around your bare upper arm. There should be enough room to slip a fingertip under the cuff. The bottom edge of the cuff should be 1 inch above the crease of the elbow. Ideally, take 3 measurements at one sitting and record the average.  Important lifestyle changes to control high blood pressure  Intervention  Effect on the BP  Lose extra pounds and watch your waistline Weight loss is one of the most effective lifestyle changes for controlling blood pressure. If you're overweight or obese, losing even a small amount of weight can help reduce blood pressure. Blood pressure might go down by about 1 millimeter of mercury (mm Hg) with each kilogram (about 2.2 pounds) of weight lost.  Exercise regularly As a general goal, aim for at least 30 minutes of moderate physical activity every day. Regular physical activity can lower high blood pressure by about 5 to 8 mm Hg.  Eat a healthy diet Eating a diet rich in whole grains, fruits, vegetables, and low-fat dairy products and low in saturated fat and cholesterol. A healthy  diet can lower high blood pressure by up to 11 mm Hg.  Reduce salt (sodium) in your diet Even a small reduction of sodium in the diet can improve heart health and reduce high blood pressure by about 5 to 6 mm Hg.  Limit alcohol One drink equals 12 ounces of beer, 5 ounces of wine, or 1.5 ounces of 80-proof liquor.  Limiting alcohol to less than one drink a day for women or two drinks a day for men can help lower blood pressure by about 4 mm Hg.   If you have any questions or concerns please use My Chart to send questions or call the office at [phone]

## 2022-05-19 NOTE — Assessment & Plan Note (Signed)
Assessment: BP is uncontrolled in office BP 178/80 mmHg upon repeating 168/80  with HR 85  Home BP on wrist cuff ~155/85 with heart rate 85  Patient reports he has white coat syndrome  Tolerates current BP medications well without any side effects Denies SOB, palpitation, chest pain, headaches,or swelling Reiterated the importance of regular exercise and low salt diet  To improve tolerability patient is in agreement to start olmesartan 10 mg   Plan:  Start taking olmesartan 10 mg daily ( 1/2 tablet of 20 mg- will use up home supply) Continue taking amlodipine 10 mg and Toprol Xl 50 mg daily  Patient to keep record of BP readings with heart rate and report to [place] at the next visit Patient to bring BP cuff for validation in 2 weeks  Patient to see PharmD in 4 weeks for follow up  Follow up lab(s): BMP in 2 weeks

## 2022-05-20 ENCOUNTER — Telehealth: Payer: Self-pay | Admitting: Cardiology

## 2022-05-20 DIAGNOSIS — I10 Essential (primary) hypertension: Secondary | ICD-10-CM

## 2022-05-20 NOTE — Telephone Encounter (Signed)
Pt c/o medication issue:  1. Name of Medication: rosuvastatin (CRESTOR) 20 MG tablet   2. How are you currently taking this medication (dosage and times per day)? N/A  3. Are you having a reaction (difficulty breathing--STAT)? N/A  4. What is your medication issue? Received a message via patient schedule with the patient stating   "The order for Rosuvastatin is for 20mg . I previously did not feel good from lower mg. I thought she said she was going to start me on a low dose and build later."  Please advise.

## 2022-05-21 NOTE — Telephone Encounter (Signed)
called patient. Patient miss understood our previous conversation, will be trying Crestor 20 mg and adjust the dose based on tolerability. Patient also restarted olmesartan which he did not tolerate well last time so advised him to wait to start Crestor 20 mg in 7-10 days.   Will call to follow up in 3 weeks.

## 2022-05-23 ENCOUNTER — Other Ambulatory Visit: Payer: Self-pay | Admitting: Internal Medicine

## 2022-05-23 DIAGNOSIS — E119 Type 2 diabetes mellitus without complications: Secondary | ICD-10-CM

## 2022-05-23 DIAGNOSIS — I10 Essential (primary) hypertension: Secondary | ICD-10-CM

## 2022-06-02 ENCOUNTER — Ambulatory Visit: Payer: Medicare HMO

## 2022-06-03 ENCOUNTER — Ambulatory Visit: Payer: Medicare HMO | Attending: Cardiology

## 2022-06-03 ENCOUNTER — Encounter: Payer: Self-pay | Admitting: Cardiology

## 2022-06-03 DIAGNOSIS — I10 Essential (primary) hypertension: Secondary | ICD-10-CM | POA: Diagnosis not present

## 2022-06-03 NOTE — Addendum Note (Signed)
Addended by: Anda Latina on: 06/03/2022 10:04 AM   Modules accepted: Orders

## 2022-06-03 NOTE — Addendum Note (Signed)
Addended by: Anda Latina on: 06/03/2022 09:31 AM   Modules accepted: Orders

## 2022-06-03 NOTE — Addendum Note (Signed)
Addended by: Anda Latina on: 06/03/2022 09:54 AM   Modules accepted: Orders

## 2022-06-03 NOTE — Addendum Note (Signed)
Addended by: Anda Latina on: 06/03/2022 09:58 AM   Modules accepted: Orders

## 2022-06-03 NOTE — Addendum Note (Signed)
Addended by: Anda Latina on: 06/03/2022 09:49 AM   Modules accepted: Orders

## 2022-06-03 NOTE — Telephone Encounter (Signed)
BMP lab orders changed to clinic collect.

## 2022-06-03 NOTE — Addendum Note (Signed)
Addended by: Anda Latina on: 06/03/2022 10:07 AM   Modules accepted: Orders

## 2022-06-04 DIAGNOSIS — M1611 Unilateral primary osteoarthritis, right hip: Secondary | ICD-10-CM | POA: Diagnosis not present

## 2022-06-04 LAB — BASIC METABOLIC PANEL
BUN/Creatinine Ratio: 29 — ABNORMAL HIGH (ref 10–24)
BUN: 23 mg/dL (ref 8–27)
CO2: 28 mmol/L (ref 20–29)
Calcium: 9.5 mg/dL (ref 8.6–10.2)
Chloride: 99 mmol/L (ref 96–106)
Creatinine, Ser: 0.78 mg/dL (ref 0.76–1.27)
Glucose: 148 mg/dL — ABNORMAL HIGH (ref 70–99)
Potassium: 3.7 mmol/L (ref 3.5–5.2)
Sodium: 142 mmol/L (ref 134–144)
eGFR: 94 mL/min/{1.73_m2} (ref >59)

## 2022-06-05 ENCOUNTER — Encounter: Payer: Self-pay | Admitting: Pharmacist

## 2022-06-05 MED ORDER — OLMESARTAN MEDOXOMIL 20 MG PO TABS: 20.0000 mg | ORAL_TABLET | Freq: Every day | ORAL | 3 refills | Status: DC

## 2022-06-05 NOTE — Addendum Note (Signed)
Addended by: Tylene Fantasia on: 06/05/2022 11:01 AM   Modules accepted: Orders

## 2022-06-05 NOTE — Telephone Encounter (Signed)
error 

## 2022-06-05 NOTE — Telephone Encounter (Addendum)
N/A LVM.  Patient called back  Reports tolerates Olmesartan 20 mg well without any s/e home BP ~144-147/70-84 from past few days. No s/e from olmesartan.  Tried Crestor 20 mg for 7-10 days - myalgia  Will try 10 mg or even 5 mg  Follow up visit in 3 weeks.   Prescription for olmesartan 20 mg sent to the [facility] per patient request and patient has been informed.

## 2022-06-06 ENCOUNTER — Other Ambulatory Visit: Payer: Self-pay | Admitting: Gastroenterology

## 2022-06-08 ENCOUNTER — Other Ambulatory Visit: Payer: Self-pay | Admitting: Internal Medicine

## 2022-06-08 ENCOUNTER — Telehealth: Payer: Self-pay | Admitting: Gastroenterology

## 2022-06-08 MED ORDER — MESALAMINE 400 MG PO CPDR: 400.0000 mg | DELAYED_RELEASE_CAPSULE | Freq: Three times a day (TID) | ORAL | 0 refills | Status: DC

## 2022-06-08 MED ORDER — OLMESARTAN MEDOXOMIL 20 MG PO TABS: 20.0000 mg | ORAL_TABLET | Freq: Every day | ORAL | 1 refills | Status: DC

## 2022-06-08 NOTE — Telephone Encounter (Signed)
Informed patient he is due for follow up visit. Patient scheduled appt for 07/23/22 at 1:50pm. Prescription sent to patient's pharmacy.

## 2022-06-08 NOTE — Telephone Encounter (Signed)
Patient is calling states he tried to get his Mesalamine refilled but it was rejected and is not sure why. Please advise

## 2022-06-08 NOTE — Addendum Note (Signed)
Addended by: Tylene Fantasia on: 06/08/2022 02:59 PM   Modules accepted: Orders

## 2022-06-09 ENCOUNTER — Ambulatory Visit: Payer: Medicare HMO | Admitting: Internal Medicine

## 2022-06-13 DIAGNOSIS — M25551 Pain in right hip: Secondary | ICD-10-CM | POA: Diagnosis not present

## 2022-06-15 ENCOUNTER — Telehealth: Payer: Self-pay | Admitting: Pharmacist

## 2022-06-15 DIAGNOSIS — E785 Hyperlipidemia, unspecified: Secondary | ICD-10-CM

## 2022-06-15 NOTE — Telephone Encounter (Signed)
Call to follow up on Crestor.  Reports unable to take 20 mg dose due to myalgia however taking 10 mg without any issue. Encourage him to continue with 10 mg dose and will repeat LFT and fasting lipid in 3 months.

## 2022-06-22 DIAGNOSIS — M25551 Pain in right hip: Secondary | ICD-10-CM | POA: Diagnosis not present

## 2022-06-23 NOTE — Progress Notes (Unsigned)
Name: Patrick Floyd  Age/ Sex: 77 y.o., male   MRN/ DOB: [id number], 1946-01-15     PCP: Etta Grandchild, MD   Reason for Endocrinology Evaluation: Type 2 Diabetes Mellitus  Initial Endocrine Consultative Visit: 03/23/2016    PATIENT IDENTIFIER: Patrick Floyd is a 77 y.o. male with a past medical history of T2Dm, HTN , UC ocular migraines and dyslipidemia . The patient has followed with Endocrinology clinic since 03/23/2016 for consultative assistance with management of his diabetes.  DIABETIC HISTORY:  Patrick Floyd was diagnosed with DM in 2014, he has been on metformin since his diagnosis.  His hemoglobin A1c has ranged from 6.1% in 2023, peaking at 8.6% in 2019.  HYPERPARATHYROID HISTORY:  He was diagnosed with hyperparathyroidism in 2010.  He is status post parathyroidectomy in 2018  Last DXA 2018-low bone density   SUBJECTIVE:   During the last visit (12/23/2021): A1c 6.1%    Today (06/24/2022): Patrick Floyd is here for a follow up on diabetes management.  He checks his blood sugars 0 times daily.   He has been following up with Ortho for  right hip pain He has been following up with cardiology for hypertrophic cardiomyopathy   He had an abnormal renin with elevated Aldo/renin ratio 04/2022, which was checked during evaluation of HTN 180/88  mmhg, he was not on ARB at the time due to dizziness   Restarted rosuvastatin 3 weeks    HOME ENDOCRINE REGIMEN:  Metformin 500 mg XR daily  Rosuvatstain 20 mg, half a tablet  Vitamin D  2000 iu daily      Statin: not taking crestor  ACE-I/ARB: No    METER DOWNLOAD SUMMARY: did not bring      DIABETIC COMPLICATIONS: Microvascular complications:   Denies: CKD, neuropathy, retinopathy  Last Eye Exam: Completed 06/2021  Macrovascular complications:   Denies: CAD, CVA, PVD   HISTORY:  Past Medical History:  Past Medical History:  Diagnosis Date   Diverticulosis of colon    GERD (gastroesophageal  reflux disease)    watches diet   Hemorrhoids, internal    History of adenomatous polyp of colon    History of anal fissures    History of basal cell carcinoma    History of COVID-19 01/2021   per Patrick Floyd mild symptoms that resolved   History of primary hyperparathyroidism 01/2016   endocrinologist--- dr Lonzo Cloud;  s/p right superior parathyroidectomy 06/ 2018,  adenoma   Hyperlipidemia    Hypertension    Hypertrophic cardiomyopathy    cardiologist--- dr Anne Fu   IBS (irritable bowel syndrome)    Inguinal hernia, bilateral    Nocturia    PONV (postoperative nausea and vomiting)    RBBB (right bundle branch block with left anterior fascicular block)    Seasonal allergies    Type 2 diabetes mellitus    followed by dr Lonzo Cloud (endocrinologist);;  (06-25-2021 Patrick Floyd stated does not check blood sugar at home)   Ulcerative colitis, left sided    followed by dr stark (GI)   Umbilical hernia    Wears glasses    Past Surgical History:  Past Surgical History:  Procedure Laterality Date   COLONOSCOPY WITH PROPOFOL  05/27/2020   by dr stark   INGUINAL HERNIA REPAIR Bilateral 06/27/2021   Procedure: LAPAROSCOPIC BILATERAL INGUINAL HERNIA REPAIR;  Surgeon: Karie Soda, MD;  Location: [facility];  Service: General;  Laterality: Bilateral;   MASS EXCISION Left 02/12/2017   Procedure: EXCISION 6x6 CM BACK  MASS;  Surgeon: Luretha Murphy, MD;  Location: [facility];  Service: General;  Laterality: Left;   PARATHYROIDECTOMY Right 08/20/2016   ;  right superior   (adenoma)   UMBILICAL HERNIA REPAIR  06/27/2021   Procedure: PRIMARY UMBILICAL HERNIA REPAIR;  Surgeon: Karie Soda, MD;  Location: [facility];  Service: General;;   Social History:  reports that he quit smoking about 42 years ago. His smoking use included cigarettes. He has never used smokeless tobacco. He reports that he does not currently use alcohol. He reports that he does not use drugs. Family  History:  Family History  Problem Relation Age of Onset   Hypertension Other    Breast cancer Mother    Heart disease Father    Hypertrophic cardiomyopathy Sister    Colon cancer Neg Hx    Stomach cancer Neg Hx    Cancer Neg Hx        Colon and Prostate   Hyperparathyroidism Neg Hx    Colon polyps Neg Hx    Esophageal cancer Neg Hx    Rectal cancer Neg Hx      HOME MEDICATIONS: Allergies as of 06/24/2022   No Known Allergies      Medication List        Accurate as of June 24, 2022 10:17 AM. If you have any questions, ask your nurse or doctor.          STOP taking these medications    atorvastatin 20 MG tablet Commonly known as: LIPITOR Stopped by: Scarlette Shorts, MD       TAKE these medications    acetaminophen 500 MG tablet Commonly known as: TYLENOL Take 500 mg by mouth every 6 (six) hours as needed.   amLODipine 10 MG tablet Commonly known as: NORVASC TAKE 1 TABLET BY MOUTH EVERY DAY   Mesalamine 400 MG Cpdr DR capsule Commonly known as: ASACOL Take 1 capsule (400 mg total) by mouth 3 (three) times daily.   metFORMIN 500 MG 24 hr tablet Commonly known as: GLUCOPHAGE-XR Take 1 tablet (500 mg total) by mouth daily with breakfast.   metoprolol succinate 50 MG 24 hr tablet Commonly known as: TOPROL-XL TAKE WITH OR IMMEDIATELY FOLLOWING A MEAL EVERY DAY   naproxen sodium 220 MG tablet Commonly known as: ALEVE Take 220 mg by mouth 2 (two) times daily as needed.   olmesartan 20 MG tablet Commonly known as: BENICAR Take 1 tablet (20 mg total) by mouth daily.   rosuvastatin 20 MG tablet Commonly known as: CRESTOR Take 1 tablet (20 mg total) by mouth daily.   VITAMIN D PO Take by mouth daily.         OBJECTIVE:   Vital Signs: BP 136/84 (BP Location: Left Arm, Patient Position: Sitting, Cuff Size: Large)   Pulse 74   Ht  (1.854 m)   Wt 199 lb (90.3 kg)   SpO2 96%   BMI 26.25 kg/m   Wt Readings from Last 3 Encounters:   06/24/22 199 lb (90.3 kg)  04/27/22 201 lb (91.2 kg)  01/19/22 200 lb (90.7 kg)     Exam: General: Patrick Floyd appears well and is in NAD  Lungs: Clear with good BS bilat   Heart: RRR   Abdomen:  soft, nontender  Extremities: + 1 pretibial edema.   Neuro: MS is good with appropriate affect, Patrick Floyd is alert and Ox3    DM foot exam: 06/24/2022  The skin of the feet is intact without sores  or ulcerations. The pedal pulses are 2+ on right and 2+ on left. The sensation is intact to a screening 5.07, 10 gram monofilament bilaterally    DATA REVIEWED:  Lab Results  Component Value Date   HGBA1C 6.2 (A) 06/24/2022   HGBA1C 6.4 04/27/2022   HGBA1C 6.1 (A) 12/23/2021       Latest Reference Range & Units 06/03/22 13:38  Sodium 134 - 144 mmol/L 142  Potassium 3.5 - 5.2 mmol/L 3.7  Chloride 96 - 106 mmol/L 99  CO2 20 - 29 mmol/L 28  Glucose 70 - 99 mg/dL 045 (H)  BUN 8 - 27 mg/dL 23  Creatinine 4.09 - 8.11 mg/dL 9.14  Calcium 8.6 - 78.2 mg/dL 9.5  BUN/Creatinine Ratio 10 - 24  29 (H)  eGFR >59 mL/min/1.73 94    In office BG 124 mg/dL    ASSESSMENT / PLAN / RECOMMENDATIONS:   1) Type 2 Diabetes Mellitus, optimally controlled, With out complications - Most recent A1c of 6.2%. Goal A1c <7.0%.     -His A1c remains at goal -He has been encouraged to continue with lifestyle changes  MEDICATIONS: Continue  metformin 500 mg XR daily    2) Diabetic complications:  Eye: Does not have known diabetic retinopathy.  Neuro/ Feet: Does not have known diabetic peripheral neuropathy .  Renal: Patient does not have known baseline CKD. He is intolerant to losartan.  We discussed renal benefits of ARB and ACE inhibitors   3) Dyslipidemia:  -LDL remains above goal  -In the past he had endorsed multiple nonspecific symptoms or side effects to rosuvastatin -He is currently on rosuvastatin half a tablet at bedtime and tolerating it well  Medication Continue rosuvastatin 20 mg, half a tablet  at bedtime  4) Low renin:  -This was done through PCPs office during evaluation of hypertensive episodes with SBP >180 mmhg -His renin was low with elevated Aldo/PRA ratio -Today we discussed the abnormal screening test for hyperaldosteronism, we discussed proper testing to include confirmation, followed by localization - Would like to avoid  surgical intervention at this time  -We opted to proceed with repeat renin/aldo , if his renin continues to be low while on olmesartan, then he would not need confirmation, as low renin in the setting of ARB will be a confirmation for hyperaldosteronism -Will proceed with adrenal imaging -I did suggest that if his test comes back abnormal, we would proceed with starting him on spironolactone to replace amlodipine   F/U in 6 months   Signed electronically by: Lyndle Herrlich, MD  [facility] Endocr[facility]ology  [facility] [address] [place] [place], [place] [address] Phone: [phone] FAX: [phone]   CC: Etta Grandchild, MD [address] [place] [address] Phone: [phone]  Fax: [phone]  Return to Endocrinology clinic as below: Future Appointments  Date Time Provider Department Center  06/25/2022  9:30 AM [facility] PHARMACIST [facility]  07/23/2022  1:50 PM Meryl Dare, MD [facility]

## 2022-06-24 ENCOUNTER — Encounter: Payer: Self-pay | Admitting: Internal Medicine

## 2022-06-24 ENCOUNTER — Ambulatory Visit (INDEPENDENT_AMBULATORY_CARE_PROVIDER_SITE_OTHER): Payer: Medicare HMO | Admitting: Internal Medicine

## 2022-06-24 VITALS — BP 136/84 | HR 74 | Ht 73.0 in | Wt 199.0 lb

## 2022-06-24 DIAGNOSIS — R7989 Other specified abnormal findings of blood chemistry: Secondary | ICD-10-CM | POA: Diagnosis not present

## 2022-06-24 DIAGNOSIS — Z7984 Long term (current) use of oral hypoglycemic drugs: Secondary | ICD-10-CM | POA: Diagnosis not present

## 2022-06-24 DIAGNOSIS — E785 Hyperlipidemia, unspecified: Secondary | ICD-10-CM | POA: Diagnosis not present

## 2022-06-24 DIAGNOSIS — E119 Type 2 diabetes mellitus without complications: Secondary | ICD-10-CM | POA: Diagnosis not present

## 2022-06-24 LAB — POCT GLYCOSYLATED HEMOGLOBIN (HGB A1C): Hemoglobin A1C: 6.2 % — AB (ref 4.0–5.6)

## 2022-06-24 LAB — POCT GLUCOSE (DEVICE FOR HOME USE): Glucose Fasting, POC: 124 mg/dL — AB (ref 70–99)

## 2022-06-24 NOTE — Patient Instructions (Addendum)
  Continue  Metformin 500 mg XR daily   Read about hyperaldosteronism [facility] clinic website)

## 2022-06-25 ENCOUNTER — Ambulatory Visit: Payer: Medicare HMO | Attending: Cardiovascular Disease | Admitting: Pharmacist

## 2022-06-25 VITALS — BP 140/70 | HR 65

## 2022-06-25 DIAGNOSIS — I10 Essential (primary) hypertension: Secondary | ICD-10-CM | POA: Diagnosis not present

## 2022-06-25 LAB — ALDOSTERONE + RENIN ACTIVITY W/ RATIO

## 2022-06-25 LAB — POTASSIUM: Potassium: 3.9 mmol/L (ref 3.5–5.2)

## 2022-06-25 NOTE — Patient Instructions (Addendum)
Start back with aerobic exercise. Try the different machines to find one that doesn't bother your hip Consider adding weights for upper body/core  I will talk with Dr. Lonzo Cloud when your labs come back. I will call you to discuss the plan.  Make sure you rest 5 min before checking blood pressure.  Call me at [phone] with any questions

## 2022-06-25 NOTE — Progress Notes (Signed)
Patient ID: RECO SHONK                 DOB: 05-Dec-1945                      MRN: [id number]      HPI: Patrick Floyd is a 77 y.o. male referred by Dr. Anne Fu to HTN clinic. PMH is significant for hypertrophic cardiomyopathy, T2DM, ulcerative colitis, hyperlipidemia.   At last appointment with PharmD, BP was very elevated. Patient reported home BP around 150-160/80-90. Back in Feb patient was started on omlesartan and indapamide by PCP but patient stopped taking stating they made him tired. Olmesartan  daily was resumed, which was later increased to  daily. He was also started on rosuvastatin  which he reported doing well on. Renal function and K stable on ARB.   Of note patient did have low renin with elevated Aldo/PRA ratio (prior to ARB use). He is followed by endocrinology who will be doing further testing to rule in or out hyperaldosteronism. If he is positive for hyperaldosteronism he will need to be started on spironolactone. Lab was drawn yesterday.  May take a few days to come back.  Patient presents today to hypertension clinic.  He brings in a list of his home blood pressure readings and his wrist cuff.  Readings range from 130s to 150s/60s to 80s.  He admits that in the beginning he was resting 5 minutes most recently only resting 30 seconds or so before checking blood pressure.  He checks 3 times in a row separated by about 30 seconds.  Today in clinic with proper technique his blood pressure cuff was found to be accurate.  We talked about what hyperaldosterone is on is and how it could be affecting his blood pressure.  We also talked about spironolactone and how we can use this medication to treat it and lower the blood pressure.  Patient is to walk for exercise but his hip has been bothering him too much.  Was taking naproxen or Tylenol and now is on diclofenac.  We did talk about how this can increase his blood pressure, effects on kidney and heart with longtime use.   He is going to start physical therapy which I thought was a great idea.  He does have a membership to [employer] and does think that some of the other cardio machines there do not bother his hip.  Home wrist cuff:146/69 HR 66 Clinic cuff 148/68  Current mediations: amlodipine 10 mg daily, Toprol XL 50 mg daily, olmesartan  daily Previously tried mediations: olmesartan, indapamide  BP goal: <130/80   Family History:  Mother- heart attack  Father - HTN, heart attack  Siblings- colitis, cardiomyopathy    Social History:  Alcohol: occasional 1-2 drinks per week Smoking: quit 42 years ago   Diet: eats home cooked meals most days of the week eats out once or twice week can reduce that to every other day.  Advised to cut down on the salt intake     Exercise: walking 2 miles  3-4 times per week lately unable to walk due to hip pain    Home BP readings: 130-150's systolic   Lab Results  Component Value Date   CHOL 206 (H) 12/23/2021   HDL 49.10 12/23/2021   LDLCALC 137 (H) 12/23/2021   LDLDIRECT 166.7 08/24/2012   TRIG 101.0 12/23/2021   CHOLHDL 4 12/23/2021     Wt Readings from Last 3  Encounters:  06/24/22 199 lb (90.3 kg)  04/27/22 201 lb (91.2 kg)  01/19/22 200 lb (90.7 kg)   BP Readings from Last 3 Encounters:  06/25/22 (!) 140/70  06/24/22 136/84  05/19/22 (!) 168/80   Pulse Readings from Last 3 Encounters:  06/25/22 65  06/24/22 74  05/19/22 85    Renal function: CrCl cannot be calculated (Patient's most recent lab result is older than the maximum 21 days allowed.).  Past Medical History:  Diagnosis Date   Diverticulosis of colon    GERD (gastroesophageal reflux disease)    watches diet   Hemorrhoids, internal    History of adenomatous polyp of colon    History of anal fissures    History of basal cell carcinoma    History of COVID-19 01/2021   per pt mild symptoms that resolved   History of primary hyperparathyroidism 01/2016   endocrinologist---  dr Lonzo Cloud;  s/p right superior parathyroidectomy 06/ 2018,  adenoma   Hyperlipidemia    Hypertension    Hypertrophic cardiomyopathy    cardiologist--- dr Anne Fu   IBS (irritable bowel syndrome)    Inguinal hernia, bilateral    Nocturia    PONV (postoperative nausea and vomiting)    RBBB (right bundle branch block with left anterior fascicular block)    Seasonal allergies    Type 2 diabetes mellitus    followed by dr Lonzo Cloud (endocrinologist);;  (06-25-2021 pt stated does not check blood sugar at home)   Ulcerative colitis, left sided    followed by dr stark (GI)   Umbilical hernia    Wears glasses     Current Outpatient Medications on File Prior to Visit  Medication Sig Dispense Refill   diclofenac (VOLTAREN) 75 MG EC tablet Take 75 mg by mouth daily.     acetaminophen (TYLENOL) 500 MG tablet Take 500 mg by mouth every 6 (six) hours as needed.     amLODipine (NORVASC) 10 MG tablet TAKE 1 TABLET BY MOUTH EVERY DAY 90 tablet 3   Mesalamine (ASACOL) 400 MG CPDR DR capsule Take 1 capsule (400 mg total) by mouth 3 (three) times daily. 270 capsule 0   metFORMIN (GLUCOPHAGE-XR) 500 MG 24 hr tablet Take 1 tablet (500 mg total) by mouth daily with breakfast. 90 tablet 3   metoprolol succinate (TOPROL-XL) 50 MG 24 hr tablet TAKE WITH OR IMMEDIATELY FOLLOWING A MEAL EVERY DAY 90 tablet 3   olmesartan (BENICAR) 20 MG tablet Take 1 tablet (20 mg total) by mouth daily. 90 tablet 1   rosuvastatin (CRESTOR) 20 MG tablet Take 1 tablet (20 mg total) by mouth daily. 90 tablet 3   VITAMIN D PO Take by mouth daily.     No current facility-administered medications on file prior to visit.    No Known Allergies  Blood pressure (!) 140/70, pulse 65, SpO2 99 %.   Assessment/Plan:  1. Hypertension -  Essential hypertension Assessment: Blood pressure is above goal of less than 130/80 today in clinic.  Home blood pressures range from the 130s systolic to 150s.  This is an improvement from  previous Patient's activity level has decreased due to hip pain. Home wrist cuff was found to be accurate.  We did review proper technique. He saw endocrinology yesterday who drew repeat renal and aldosterone labs.  Still pending. Patient also ordered CT to evaluate his adrenal gland.  Encouraged patient to call clinic back to schedule CT. If patient is diagnosed with hyperaldosteronism the plan would be to start  him on spironolactone.  Dr.Shamleffer note suggested that he would stop amlodipine.  Although I agree that he will need to be uptitrated on spironolactone, I think possibly down titrating amlodipine while uptitrating spironolactone might be a better option given that his blood pressure is not at goal.  Plan: Will await the results of his lab work before making any medication changes.  I will follow recommendations from Dr.Dr.Shamleffer. I have encouraged patient to go to the [facility] and resume exercise such as the recumbent bike but does not bother his hip.  Also encouraged upper body strength exercises and core exercises. Patient scheduled for follow-up in clinic in 3 weeks Continue checking blood pressure at home, but rest 5 minutes before checking.  Make sure to rest elbow on table.    Thank you  Olene Floss, Pharm.D, BCPS, CPP [facility] [address], [place], [place] [address]  Phone: [phone]; Fax: [phone]

## 2022-06-25 NOTE — Assessment & Plan Note (Signed)
Assessment: Blood pressure is above goal of less than 130/80 today in clinic.  Home blood pressures range from the 130s systolic to 150s.  This is an improvement from previous Patient's activity level has decreased due to hip pain. Home wrist cuff was found to be accurate.  We did review proper technique. He saw endocrinology yesterday who drew repeat renal and aldosterone labs.  Still pending. Patient also ordered CT to evaluate his adrenal gland.  Encouraged patient to call clinic back to schedule CT. If patient is diagnosed with hyperaldosteronism the plan would be to start him on spironolactone.  Dr.Shamleffer note suggested that he would stop amlodipine.  Although I agree that he will need to be uptitrated on spironolactone, I think possibly down titrating amlodipine while uptitrating spironolactone might be a better option given that his blood pressure is not at goal.  Plan: Will await the results of his lab work before making any medication changes.  I will follow recommendations from Dr.Dr.Shamleffer. I have encouraged patient to go to the [facility] and resume exercise such as the recumbent bike but does not bother his hip.  Also encouraged upper body strength exercises and core exercises. Patient scheduled for follow-up in clinic in 3 weeks Continue checking blood pressure at home, but rest 5 minutes before checking.  Make sure to rest elbow on table.

## 2022-06-26 ENCOUNTER — Encounter: Payer: Self-pay | Admitting: Internal Medicine

## 2022-06-30 LAB — ALDOSTERONE + RENIN ACTIVITY W/ RATIO

## 2022-07-01 LAB — ALDOSTERONE + RENIN ACTIVITY W/ RATIO: Renin Activity, Plasma: 1.693 ng/mL/hr (ref 0.167–5.380)

## 2022-07-02 ENCOUNTER — Encounter: Payer: Self-pay | Admitting: Internal Medicine

## 2022-07-03 ENCOUNTER — Encounter (INDEPENDENT_AMBULATORY_CARE_PROVIDER_SITE_OTHER): Payer: Medicare HMO | Admitting: Pharmacist

## 2022-07-03 DIAGNOSIS — I10 Essential (primary) hypertension: Secondary | ICD-10-CM | POA: Diagnosis not present

## 2022-07-03 MED ORDER — OLMESARTAN MEDOXOMIL 40 MG PO TABS: 40.0000 mg | ORAL_TABLET | Freq: Every day | ORAL | 3 refills | Status: DC

## 2022-07-07 DIAGNOSIS — M25551 Pain in right hip: Secondary | ICD-10-CM | POA: Diagnosis not present

## 2022-07-14 DIAGNOSIS — M25551 Pain in right hip: Secondary | ICD-10-CM | POA: Diagnosis not present

## 2022-07-16 ENCOUNTER — Ambulatory Visit: Payer: Medicare HMO | Attending: Interventional Cardiology | Admitting: Pharmacist

## 2022-07-16 VITALS — BP 128/68 | HR 59

## 2022-07-16 DIAGNOSIS — I10 Essential (primary) hypertension: Secondary | ICD-10-CM | POA: Diagnosis not present

## 2022-07-16 NOTE — Assessment & Plan Note (Signed)
Assessment: Blood pressure in clinic today is at goal of less than 130/80 Home blood pressure is above goal Patient takes diclofenac daily, he tries to cut back but could not. Has not been exercising as advised at last visit Pending CT of abdomen to assess for hyperaldosteronism on June 4  Plan: Will not make any medication changes today Encourage patient to increase his physical activity which should help improve his blood pressure.  Patient previously exercise frequently until his hip started bothering him.  We talked about physical activity he could do that would not bother his hip Could potentially change his metoprolol to carvedilol in the future.  He does have hypertrophic cardiomyopathy listed in his chart but no known obstruction.  Therefore this could be a possibility to use. Follow-up in 4 weeks

## 2022-07-16 NOTE — Patient Instructions (Addendum)
Summary of today's discussion  Start checking blood pressure at least 2 hr after medication  2. Continue amlodipine 10 mg daily, Toprol XL 50 mg daily, olmesartan 40mg  daily  3. Increase physical activity  4. Call me with any issues  5.   Your blood pressure goal is <130/80  To check your pressure at home you will need to:  1. Sit up in a chair, with feet flat on the floor and back supported. Do not cross your ankles or legs. 2. Rest your left arm so that the cuff is about heart level. If the cuff goes on your upper arm,  then just relax the arm on the table, arm of the chair or your lap. If you have a wrist cuff, we  suggest relaxing your wrist against your chest (think of it as Pledging the Flag with the  wrong arm).  3. Place the cuff snugly around your arm, about 1 inch above the crook of your elbow. The  cords should be inside the groove of your elbow.  4. Sit quietly, with the cuff in place, for about 5 minutes. After that 5 minutes press the power  button to start a reading. 5. Do not talk or move while the reading is taking place.  6. Record your readings on a sheet of paper. Although most cuffs have a memory, it is often  easier to see a pattern developing when the numbers are all in front of you.  7. You can repeat the reading after 1-3 minutes if it is recommended  Make sure your bladder is empty and you have not had caffeine or tobacco within the last 30 min  Always bring your blood pressure log with you to your appointments. If you have not brought your monitor in to be double checked for accuracy, please bring it to your next appointment.  You can find a list of validated (accurate) blood pressure cuffs at [email or web address]   Important lifestyle changes to control high blood pressure  Intervention  Effect on the BP  Lose extra pounds and watch your waistline Weight loss is one of the most effective lifestyle changes for controlling blood pressure. If you're overweight  or obese, losing even a small amount of weight can help reduce blood pressure. Blood pressure might go down by about 1 millimeter of mercury (mm Hg) with each kilogram (about 2.2 pounds) of weight lost.  Exercise regularly As a general goal, aim for at least 30 minutes of moderate physical activity every day. Regular physical activity can lower high blood pressure by about 5 to 8 mm Hg.  Eat a healthy diet Eating a diet rich in whole grains, fruits, vegetables, and low-fat dairy products and low in saturated fat and cholesterol. A healthy diet can lower high blood pressure by up to 11 mm Hg.  Reduce salt (sodium) in your diet Even a small reduction of sodium in the diet can improve heart health and reduce high blood pressure by about 5 to 6 mm Hg.  Limit alcohol One drink equals 12 ounces of beer, 5 ounces of wine, or 1.5 ounces of 80-proof liquor.  Limiting alcohol to less than one drink a day for women or two drinks a day for men can help lower blood pressure by about 4 mm Hg.   Please call me at [phone] with any questions.

## 2022-07-16 NOTE — Progress Notes (Signed)
Patient ID: Patrick Floyd                 DOB: 1946-02-21                      MRN: [id number]      HPI: Patrick Floyd is a 77 y.o. male referred by Dr. Anne Fu to HTN clinic. PMH is significant for hypertrophic cardiomyopathy, T2DM, ulcerative colitis, hyperlipidemia.   At last appointment with PharmD, BP was very elevated. Patient reported home BP around 150-160/80-90. Back in Feb patient was started on omlesartan and indapamide by PCP but patient stopped taking stating they made him tired. Olmesartan 10mg  daily was resumed, which was later increased to 20mg  daily. He was also started on rosuvastatin 10mg  which he reported doing well on. Renal function and K stable on ARB.   Of note patient did have low renin with elevated Aldo/PRA ratio (prior to ARB use). He is followed by endocrinology who will be doing further testing to rule in or out hyperaldosteronism. If he is positive for hyperaldosteronism he will need to be started on spironolactone. His repeat aldosterone/renin labs were not conclusive and he has been scheduled for CT to evaluate adrenal glands.   I saw patient is clinic 4/25. No changes were made at appointment, but after his labs resulted and I discussed with Dr. Lonzo Cloud, olmesartan was increased to 40mg  daily. He contacted me via mychart starting that he had a headache, felt a little dizzy with exercise and nauseous. We decided to wait a little longer to see if symptoms would go away.    Patient presents today for follow-up.  He brings in a list of blood pressure readings.  He takes 3 readings and takes the average of those 3 readings.  Average blood pressure 138/74.  He has a mix of blood pressures in the 130s and 140s.  He went to the gym only 1 day so far.  He reports that he still having some dizziness and nauseous when lying down or when he goes from a seated to standing position.  States it only lasts a few seconds.  Admits that he can live with it.  Started physical  therapy.  A lot of the exercises he is lying on his back and that is when he notices the dizziness and nauseous the most.  Home blood pressure cuff previously found to be accurate. Home wrist cuff:146/69 HR 66 Clinic cuff 148/68  Current mediations: amlodipine 10 mg daily, Toprol XL 50 mg daily, olmesartan 40mg  daily Previously tried mediations: olmesartan, indapamide  BP goal: <130/80   Family History:  Mother- heart attack  Father - HTN, heart attack  Siblings- colitis, cardiomyopathy    Social History:  Alcohol: occasional 1-2 drinks per week Smoking: quit 42 years ago   Diet: eats home cooked meals most days of the week eats out once or twice week can reduce that to every other day.  Advised to cut down on the salt intake     Exercise: walking 2 miles  3-4 times per week lately unable to walk due to hip pain    Home BP readings:  138 70  151 82  139 73  141 75  138 76  130 74  132 75  131 65  140 76  143 75  138.3 74.1    Lab Results  Component Value Date   CHOL 206 (H) 12/23/2021   HDL 49.10 12/23/2021   LDLCALC  137 (H) 12/23/2021   LDLDIRECT 166.7 08/24/2012   TRIG 101.0 12/23/2021   CHOLHDL 4 12/23/2021     Wt Readings from Last 3 Encounters:  06/24/22 199 lb (90.3 kg)  04/27/22 201 lb (91.2 kg)  01/19/22 200 lb (90.7 kg)   BP Readings from Last 3 Encounters:  07/16/22 128/68  06/25/22 (!) 140/70  06/24/22 136/84   Pulse Readings from Last 3 Encounters:  07/16/22 (!) 59  06/25/22 65  06/24/22 74    Renal function: CrCl cannot be calculated (Patient's most recent lab result is older than the maximum 21 days allowed.).  Past Medical History:  Diagnosis Date   Diverticulosis of colon    GERD (gastroesophageal reflux disease)    watches diet   Hemorrhoids, internal    History of adenomatous polyp of colon    History of anal fissures    History of basal cell carcinoma    History of COVID-19 01/2021   per pt mild symptoms that resolved    History of primary hyperparathyroidism 01/2016   endocrinologist--- dr Lonzo Cloud;  s/p right superior parathyroidectomy 06/ 2018,  adenoma   Hyperlipidemia    Hypertension    Hypertrophic cardiomyopathy [facility])    cardiologist--- dr Anne Fu   IBS (irritable bowel syndrome)    Inguinal hernia, bilateral    Nocturia    PONV (postoperative nausea and vomiting)    RBBB (right bundle branch block with left anterior fascicular block)    Seasonal allergies    Type 2 diabetes mellitus (HCC)    followed by dr Lonzo Cloud (endocrinologist);;  (06-25-2021 pt stated does not check blood sugar at home)   Ulcerative colitis, left sided (HCC)    followed by dr stark (GI)   Umbilical hernia    Wears glasses     Current Outpatient Medications on File Prior to Visit  Medication Sig Dispense Refill   acetaminophen (TYLENOL) 500 MG tablet Take 500 mg by mouth every 6 (six) hours as needed.     amLODipine (NORVASC) 10 MG tablet TAKE 1 TABLET BY MOUTH EVERY DAY 90 tablet 3   diclofenac (VOLTAREN) 75 MG EC tablet Take 75 mg by mouth daily.     Mesalamine (ASACOL) 400 MG CPDR DR capsule Take 1 capsule (400 mg total) by mouth 3 (three) times daily. 270 capsule 0   metFORMIN (GLUCOPHAGE-XR) 500 MG 24 hr tablet Take 1 tablet (500 mg total) by mouth daily with breakfast. 90 tablet 3   metoprolol succinate (TOPROL-XL) 50 MG 24 hr tablet TAKE WITH OR IMMEDIATELY FOLLOWING A MEAL EVERY DAY 90 tablet 3   olmesartan (BENICAR) 40 MG tablet Take 1 tablet (40 mg total) by mouth daily. 90 tablet 3   rosuvastatin (CRESTOR) 20 MG tablet Take 1 tablet (20 mg total) by mouth daily. 90 tablet 3   VITAMIN D PO Take by mouth daily.     No current facility-administered medications on file prior to visit.    No Known Allergies  Blood pressure 128/68, pulse (!) 59, SpO2 94 %.   Assessment/Plan:  1. Hypertension -  Essential hypertension Assessment: Blood pressure in clinic today is at goal of less than 130/80 Home  blood pressure is above goal Patient takes diclofenac daily, he tries to cut back but could not. Has not been exercising as advised at last visit Pending CT of abdomen to assess for hyperaldosteronism on June 4  Plan: Will not make any medication changes today Encourage patient to increase his physical activity which should  help improve his blood pressure.  Patient previously exercise frequently until his hip started bothering him.  We talked about physical activity he could do that would not bother his hip Could potentially change his metoprolol to carvedilol in the future.  He does have hypertrophic cardiomyopathy listed in his chart but no known obstruction.  Therefore this could be a possibility to use. Follow-up in 4 weeks    Thank you  Olene Floss, Pharm.D, BCPS, CPP [facility] [address], [place], [place] [address]  Phone: [phone]; Fax: [phone]

## 2022-07-17 DIAGNOSIS — M25551 Pain in right hip: Secondary | ICD-10-CM | POA: Diagnosis not present

## 2022-07-20 DIAGNOSIS — M25551 Pain in right hip: Secondary | ICD-10-CM | POA: Diagnosis not present

## 2022-07-22 DIAGNOSIS — M25551 Pain in right hip: Secondary | ICD-10-CM | POA: Diagnosis not present

## 2022-07-23 ENCOUNTER — Ambulatory Visit: Payer: Medicare HMO | Admitting: Gastroenterology

## 2022-07-23 ENCOUNTER — Encounter: Payer: Self-pay | Admitting: Gastroenterology

## 2022-07-23 ENCOUNTER — Other Ambulatory Visit (HOSPITAL_COMMUNITY): Payer: Self-pay

## 2022-07-23 VITALS — BP 130/74 | HR 65 | Ht 72.0 in | Wt 203.0 lb

## 2022-07-23 DIAGNOSIS — E119 Type 2 diabetes mellitus without complications: Secondary | ICD-10-CM | POA: Diagnosis not present

## 2022-07-23 DIAGNOSIS — H5213 Myopia, bilateral: Secondary | ICD-10-CM | POA: Diagnosis not present

## 2022-07-23 DIAGNOSIS — H2513 Age-related nuclear cataract, bilateral: Secondary | ICD-10-CM | POA: Diagnosis not present

## 2022-07-23 DIAGNOSIS — K5150 Left sided colitis without complications: Secondary | ICD-10-CM

## 2022-07-23 LAB — HM DIABETES EYE EXAM

## 2022-07-23 MED ORDER — MESALAMINE 400 MG PO CPDR: 1200.0000 mg | DELAYED_RELEASE_CAPSULE | Freq: Every day | ORAL | 3 refills | Status: DC

## 2022-07-23 NOTE — Patient Instructions (Signed)
We have sent the following medications to your pharmacy for you to pick up at your convenience: Asacol.  ? ?The [facility] providers would like to encourage you to use MYCHART to communicate with providers for non-urgent requests or questions.  Due to long hold times on the telephone, sending your provider a message by MYCHART may be a faster and more efficient way to get a response.  Please allow 48 business hours for a response.  Please remember that this is for non-urgent requests.  ? ?Thank you for choosing me and  Gastroenterology. ? ?Malcolm T. Stark, Jr., MD., FACG ? ? ? ?

## 2022-07-23 NOTE — Progress Notes (Signed)
Assessment     Left sided ulcerative colitis, asymptomatic   Recommendations    Continue mesalamine 1.2 g every morning.  Contact us if symptoms flare Colonoscopy recommended in March 2025 REV in 1 year   HPI    This is a 77 year old male with left sided ulcerative colitis.  He has no gastrointestinal complaints.  He feels well.  He denies any flares of colitis.  He decreased his mesalamine dose to 1.2 g every morning.  BMP and CBC from earlier this year were reviewed.  Colonoscopy Mar 2022 - Two 4 to 6 mm polyps in the rectum, removed with a cold snare. Resected and retrieved.  - Moderate diverticulosis in the left colon.  - Internal hemorrhoid - Otherwise normal. Random biopsies.   Path: Hyperplastic polyp, normal random biopsies  Labs / Imaging       Latest Ref Rng & Units 04/27/2022    9:31 AM 12/23/2021    8:44 AM 03/31/2021    9:42 AM  Hepatic Function  Total Protein 6.0 - 8.3 g/dL 6.8  7.7  7.0   Albumin 3.5 - 5.2 g/dL 4.1  4.6  4.4   AST 0 - 37 U/L 15  20  13    ALT 0 - 53 U/L 15  20  12    Alk Phosphatase 39 - 117 U/L 66  65  55   Total Bilirubin 0.2 - 1.2 mg/dL 1.2  1.3  1.2   Bilirubin, Direct 0.0 - 0.3 mg/dL 0.2   0.2        Latest Ref Rng & Units 04/27/2022    9:31 AM 06/27/2021    8:06 AM 03/31/2021    9:42 AM  CBC  WBC 4.0 - 10.5 K/uL 7.7   8.8   Hemoglobin 13.0 - 17.0 g/dL 52.8  41.3  24.4   Hematocrit 39.0 - 52.0 % 40.3  45.0  41.7   Platelets 150.0 - 400.0 K/uL 287.0   252.0      LONG TERM MONITOR (3-14 DAYS)   Normal sinus rhythm avg HR 64 bpm   Brief atrial tachycardia - benign   Rare PAC/PVC's   No atrial fibrillation no ventricular tachycardia   Reassuring monitor  Patch Wear Time:  3 days and 1 hours (2023-10-11T15:49:00-0400 to  2023-10-14T17:02:39-0400)  Patient had a min HR of 50 bpm, max HR of 120 bpm, and avg HR of 64 bpm.  Predominant underlying rhythm was Sinus Rhythm. 3 Supraventricular  Tachycardia runs occurred, the run  with the fastest interval lasting 14.1  secs with a max rate of 120 bpm, the  longest lasting 14.5 secs with an avg rate of 101 bpm. Isolated SVEs were  rare (<1.0%), SVE Couplets were rare (<1.0%), and no SVE Triplets were  present. Isolated VEs were rare (<1.0%), and no VE Couplets or VE Triplets  were present.    Current Medications, Allergies, Past Medical History, Past Surgical History, Family History and Social History were reviewed in [employer] record.   Physical Exam: General: Well developed, well nourished, no acute distress Head: Normocephalic and atraumatic Eyes: Sclerae anicteric, EOMI Ears: Normal auditory acuity Mouth: No deformities or lesions noted Lungs: Clear throughout to auscultation Heart: Regular rate and rhythm; No murmurs, rubs or bruits Abdomen: Soft, non tender and non distended. No masses, hepatosplenomegaly or hernias noted. Normal Bowel sounds Rectal: Not done Musculoskeletal: Symmetrical with no gross deformities  Pulses:  Normal pulses noted Extremities: No edema or deformities  noted Neurological: Alert oriented x 4, grossly nonfocal Psychological:  Alert and cooperative. Normal mood and affect   Adedamola Seto T. Russella Dar, MD 07/23/2022, 1:59 PM

## 2022-08-04 ENCOUNTER — Ambulatory Visit
Admission: RE | Admit: 2022-08-04 | Discharge: 2022-08-04 | Disposition: A | Discharge status: Home / Self Care | Payer: Medicare HMO | Source: Ambulatory Visit | Attending: Internal Medicine | Admitting: Internal Medicine

## 2022-08-04 DIAGNOSIS — R7989 Other specified abnormal findings of blood chemistry: Secondary | ICD-10-CM | POA: Diagnosis not present

## 2022-08-04 DIAGNOSIS — K5730 Diverticulosis of large intestine without perforation or abscess without bleeding: Secondary | ICD-10-CM | POA: Diagnosis not present

## 2022-08-04 DIAGNOSIS — N200 Calculus of kidney: Secondary | ICD-10-CM | POA: Diagnosis not present

## 2022-08-04 DIAGNOSIS — E269 Hyperaldosteronism, unspecified: Secondary | ICD-10-CM | POA: Diagnosis not present

## 2022-08-04 DIAGNOSIS — I700 Atherosclerosis of aorta: Secondary | ICD-10-CM | POA: Diagnosis not present

## 2022-08-04 DIAGNOSIS — D3500 Benign neoplasm of unspecified adrenal gland: Secondary | ICD-10-CM | POA: Diagnosis not present

## 2022-08-04 MED ORDER — IOPAMIDOL (ISOVUE-300) INJECTION 61%
100.0000 mL | Freq: Once | INTRAVENOUS | Status: AC | PRN
  Administered 2022-08-04: 100 mL via INTRAVENOUS

## 2022-08-06 ENCOUNTER — Encounter: Payer: Self-pay | Admitting: Internal Medicine

## 2022-08-07 DIAGNOSIS — M25551 Pain in right hip: Secondary | ICD-10-CM | POA: Diagnosis not present

## 2022-08-11 NOTE — Telephone Encounter (Signed)
Appointment scheduled patient aware  

## 2022-08-12 ENCOUNTER — Telehealth: Payer: Self-pay | Admitting: Internal Medicine

## 2022-08-12 ENCOUNTER — Other Ambulatory Visit (HOSPITAL_COMMUNITY): Payer: Self-pay

## 2022-08-12 ENCOUNTER — Encounter: Payer: Self-pay | Admitting: Internal Medicine

## 2022-08-12 ENCOUNTER — Ambulatory Visit: Payer: Medicare HMO | Admitting: Internal Medicine

## 2022-08-12 VITALS — BP 134/82 | HR 67 | Ht 72.0 in | Wt 201.0 lb

## 2022-08-12 DIAGNOSIS — D3501 Benign neoplasm of right adrenal gland: Secondary | ICD-10-CM | POA: Diagnosis not present

## 2022-08-12 DIAGNOSIS — D3502 Benign neoplasm of left adrenal gland: Secondary | ICD-10-CM

## 2022-08-12 DIAGNOSIS — I700 Atherosclerosis of aorta: Secondary | ICD-10-CM

## 2022-08-12 DIAGNOSIS — E119 Type 2 diabetes mellitus without complications: Secondary | ICD-10-CM

## 2022-08-12 DIAGNOSIS — E785 Hyperlipidemia, unspecified: Secondary | ICD-10-CM | POA: Diagnosis not present

## 2022-08-12 DIAGNOSIS — Z7984 Long term (current) use of oral hypoglycemic drugs: Secondary | ICD-10-CM

## 2022-08-12 NOTE — Telephone Encounter (Signed)
Patient scheduled and advised of the appointment time. Saline instructions have been faxed to infusion center.

## 2022-08-12 NOTE — Telephone Encounter (Signed)
Please schedule the pt for saline loading through infusion suite 8 AM    Thanks

## 2022-08-12 NOTE — Progress Notes (Signed)
Name: Patrick Floyd  Age/ Sex: 77 y.o., male   MRN/ DOB: [id number], May 09, 1945     PCP: Etta Grandchild, MD   Reason for Endocrinology Evaluation: Type 2 Diabetes Mellitus  Initial Endocrine Consultative Visit: 03/23/2016    PATIENT IDENTIFIER: Patrick Floyd is a 77 y.o. male with a past medical history of T2Dm, HTN , UC ocular migraines and dyslipidemia . The patient has followed with Endocrinology clinic since 03/23/2016 for consultative assistance with management of his diabetes.  DIABETIC HISTORY:  Patrick Floyd was diagnosed with DM in 2014, he has been on metformin since his diagnosis.  His hemoglobin A1c has ranged from 6.1% in 2023, peaking at 8.6% in 2019.  HYPERPARATHYROID HISTORY:  He was diagnosed with hyperparathyroidism in 2010.  He is status post parathyroidectomy in 2018  Last DXA 2018-low bone density    ADRENAL HISTORY: He had an abnormal renin with elevated Aldo/renin ratio 04/2022, which was checked during evaluation of HTN 180/88  mmhg, he was not on ARB at the time due to dizziness   He was initially reluctant with invasive testing  We opted to proceed with adrenal imaging prior to confirmation His renin normalized on olmesartan    Adrenal imaging 08/2022 revealed 15 mm right adrenal adenoma, 12 mm left adrenal adenoma  SUBJECTIVE:   During the last visit (12/23/2021): A1c 6.1%    Today (08/12/2022): Patrick Floyd is here to discuss his most recent CT scan of the abdomen showing bilateral adrenal nodules.  He is accompanied by his spouse Bonita Quin  He does endorse episode of nausea and dizziness with exercise Denies frequent or out of the ordinary headaches No recent visual changes   HOME ENDOCRINE REGIMEN:  Metformin 500 mg XR daily  Rosuvatstain 20 mg, half a tablet  Vitamin D  2000 iu daily      Statin: not taking crestor  ACE-I/ARB: No    METER DOWNLOAD SUMMARY:    DIABETIC COMPLICATIONS: Microvascular complications:    Denies: CKD, neuropathy, retinopathy  Last Eye Exam: Completed 07/23/2022  Macrovascular complications:   Denies: CAD, CVA, PVD   HISTORY:  Past Medical History:  Past Medical History:  Diagnosis Date   Diverticulosis of colon    GERD (gastroesophageal reflux disease)    watches diet   Hemorrhoids, internal    History of adenomatous polyp of colon    History of anal fissures    History of basal cell carcinoma    History of COVID-19 01/2021   per pt mild symptoms that resolved   History of primary hyperparathyroidism 01/2016   endocrinologist--- dr Lonzo Cloud;  s/p right superior parathyroidectomy 06/ 2018,  adenoma   Hyperlipidemia    Hypertension    Hypertrophic cardiomyopathy [facility])    cardiologist--- dr Anne Fu   IBS (irritable bowel syndrome)    Inguinal hernia, bilateral    Nocturia    PONV (postoperative nausea and vomiting)    RBBB (right bundle branch block with left anterior fascicular block)    Seasonal allergies    Type 2 diabetes mellitus (HCC)    followed by dr Lonzo Cloud (endocrinologist);;  (06-25-2021 pt stated does not check blood sugar at home)   Ulcerative colitis, left sided (HCC)    followed by dr stark (GI)   Umbilical hernia    Wears glasses    Past Surgical History:  Past Surgical History:  Procedure Laterality Date   COLONOSCOPY WITH PROPOFOL  05/27/2020   by dr stark   INGUINAL HERNIA REPAIR  Bilateral 06/27/2021   Procedure: LAPAROSCOPIC BILATERAL INGUINAL HERNIA REPAIR;  Surgeon: Karie Soda, MD;  Location: [facility];  Service: General;  Laterality: Bilateral;   MASS EXCISION Left 02/12/2017   Procedure: EXCISION 6x6 CM BACK MASS;  Surgeon: Luretha Murphy, MD;  Location: [facility];  Service: General;  Laterality: Left;   PARATHYROIDECTOMY Right 08/20/2016   @[facility] ;  right superior   (adenoma)   UMBILICAL HERNIA REPAIR  06/27/2021   Procedure: PRIMARY UMBILICAL HERNIA REPAIR;  Surgeon: Karie Soda, MD;  Location: [facility]  [place];  Service: General;;   Social History:  reports that he quit smoking about 42 years ago. His smoking use included cigarettes. He has never used smokeless tobacco. He reports that he does not currently use alcohol. He reports that he does not use drugs. Family History:  Family History  Problem Relation Age of Onset   Hypertension Other    Breast cancer Mother    Heart disease Father    Hypertrophic cardiomyopathy Sister    Colon cancer Neg Hx    Stomach cancer Neg Hx    Cancer Neg Hx        Colon and Prostate   Hyperparathyroidism Neg Hx    Colon polyps Neg Hx    Esophageal cancer Neg Hx    Rectal cancer Neg Hx      HOME MEDICATIONS: Allergies as of 08/12/2022   No Known Allergies      Medication List        Accurate as of August 12, 2022  8:17 AM. If you have any questions, ask your nurse or doctor.          acetaminophen 500 MG tablet Commonly known as: TYLENOL Take 500 mg by mouth every 6 (six) hours as needed.   amLODipine 10 MG tablet Commonly known as: NORVASC TAKE 1 TABLET BY MOUTH EVERY DAY   diclofenac 75 MG EC tablet Commonly known as: VOLTAREN Take 75 mg by mouth daily.   Mesalamine 400 MG Cpdr DR capsule Commonly known as: ASACOL Take 3 capsules (1,200 mg total) by mouth daily.   metFORMIN 500 MG 24 hr tablet Commonly known as: GLUCOPHAGE-XR Take 1 tablet (500 mg total) by mouth daily with breakfast.   metoprolol succinate 50 MG 24 hr tablet Commonly known as: TOPROL-XL TAKE WITH OR IMMEDIATELY FOLLOWING A MEAL EVERY DAY   olmesartan 40 MG tablet Commonly known as: BENICAR Take 1 tablet (40 mg total) by mouth daily.   rosuvastatin 20 MG tablet Commonly known as: CRESTOR Take 1 tablet (20 mg total) by mouth daily.   VITAMIN D PO Take by mouth daily.         OBJECTIVE:   Vital Signs: BP 134/82 (BP Location: Left Arm, Patient Position: Sitting, Cuff Size: Small)   Pulse 67   Ht 6' (1.829 m)   Wt 201 lb (91.2  kg)   SpO2 97%   BMI 27.26 kg/m   Wt Readings from Last 3 Encounters:  08/12/22 201 lb (91.2 kg)  07/23/22 203 lb (92.1 kg)  06/24/22 199 lb (90.3 kg)     Exam: General: Pt appears well and is in NAD  Lungs: Clear with good BS bilat   Heart: RRR   Abdomen:  soft, nontender  Extremities: + 1 pretibial edema.   Neuro: MS is good with appropriate affect, pt is alert and Ox3    DM foot exam: 06/24/2022  The skin of the feet is intact without sores or  ulcerations. The pedal pulses are 2+ on right and 2+ on left. The sensation is intact to a screening 5.07, 10 gram monofilament bilaterally    DATA REVIEWED:  Lab Results  Component Value Date   HGBA1C 6.2 (A) 06/24/2022   HGBA1C 6.4 04/27/2022   HGBA1C 6.1 (A) 12/23/2021       Latest Reference Range & Units 06/03/22 13:38  Sodium 134 - 144 mmol/L 142  Potassium 3.5 - 5.2 mmol/L 3.7  Chloride 96 - 106 mmol/L 99  CO2 20 - 29 mmol/L 28  Glucose 70 - 99 mg/dL 161 (H)  BUN 8 - 27 mg/dL 23  Creatinine 0.96 - 0.45 mg/dL 4.09  Calcium 8.6 - 81.1 mg/dL 9.5  BUN/Creatinine Ratio 10 - 24  29 (H)  eGFR >59 mL/min/1.73 94    CT abdomen 08/04/2022 Adrenals/Urinary Tract: Enhancing 15 mm right adrenal nodule measures Hounsfield units of 3 pre contrast administration fifty-five postcontrast administration and 14 on delayed compatible with a benign adrenal adenoma.   Enhancing 12 mm left adrenal nodule measures Hounsfield units of 11 pre contrast administration sixty-six postcontrast administration and 16 on delayed compatible with a benign adrenal adenoma.   No hydronephrosis. Punctate nonobstructive bilateral renal stones. Kidneys demonstrate symmetric enhancement.  ASSESSMENT / PLAN / RECOMMENDATIONS:   1) Bilateral adrenal adenoma:    - Eighty five percent of adrenal adenomas are nonsecretory.  -We discussed that these nodules are benign -Since he had a low renin, we will proceed with confirmation testing of  hyperaldosteronism -Patient given the option to either proceed with 3-day oral salt loading versus saline loading at the infusion suite -Patient opted to proceed with saline loading at the infusion suite -He is reluctant regarding invasive testing, such as adrenal venous sampling which will be needed for localization, I did explain to the patient that there are instances where he may have bilateral excretion, and with that no surgical intervention will be recommended -We also discussed medical management to include spironolactone  and eplerenone -Will eventually need to be screened for cortisol and catecholamines   2)Type 2 Diabetes Mellitus, optimally controlled, With out complications - Most recent A1c of 6.2%. Goal A1c <7.0%.     -A1c has been at goal -No changes   MEDICATIONS: Continue  metformin 500 mg XR daily    2) Diabetic complications:  Eye: Does not have known diabetic retinopathy.  Neuro/ Feet: Does not have known diabetic peripheral neuropathy .  Renal: Patient does not have known baseline CKD. He is intolerant to losartan.  We discussed renal benefits of ARB and ACE inhibitors   3) Dyslipidemia:  -LDL remains above goal  -Most recent CT scan indicated aortic atherosclerosis, patient again encouraged to continue with rosuvastatin -In the past he had endorsed multiple nonspecific symptoms or side effects to rosuvastatin   Medication Continue rosuvastatin 20 mg, half a tablet at bedtime   Signed electronically by: Lyndle Herrlich, MD  [facility]  [facility] [address] [place] [place], [place] [address] Phone: [phone] FAX: [phone]   CC: Etta Grandchild, MD [address] [place] [address] Phone: [phone]  Fax: [phone]  Return to Endocrinology clinic as below: Future Appointments  Date Time Provider Department Center  08/14/2022 10:30 AM [facility] PHARMACIST [facility]   12/28/2022 10:10 AM Kennan Detter, Konrad Dolores, MD [facility] None

## 2022-08-14 ENCOUNTER — Ambulatory Visit: Payer: Medicare HMO

## 2022-08-14 DIAGNOSIS — M25551 Pain in right hip: Secondary | ICD-10-CM | POA: Diagnosis not present

## 2022-08-18 ENCOUNTER — Encounter: Payer: Self-pay | Admitting: Internal Medicine

## 2022-08-18 ENCOUNTER — Telehealth: Payer: Self-pay

## 2022-08-18 NOTE — Telephone Encounter (Signed)
Patient wants to know if Saline loading schedule for the infusion center has been approved by insurance. Test is scheduled for 08/21/22. He states that infusion center told him that we need to contact insurance for procedure.

## 2022-08-21 ENCOUNTER — Telehealth: Payer: Self-pay | Admitting: Internal Medicine

## 2022-08-21 ENCOUNTER — Telehealth: Payer: Self-pay

## 2022-08-21 ENCOUNTER — Encounter (HOSPITAL_COMMUNITY): Payer: Medicare HMO

## 2022-08-21 NOTE — Telephone Encounter (Signed)
[facility] called and said patient needs a prior authorization for the saline infusion. It was ordered by Dr. Yetta Barre for that office.

## 2022-08-21 NOTE — Telephone Encounter (Signed)
Good Morning All!  So I see there has been a lot of confusion about a test that was recently ordered and although the actual test that is listed has this patient's PCP (Dr. Sanda Linger) listed as the referring physician it in fact was ordered by Dr. Lonzo Cloud on 08/12/22 @ 8:43am.  With that said it places the responsibility on her office to achieve the authorization based upon what was relayed to me by the infusion center.  To bring the patient some peace and to finalize everything on our end, I reached out to this patient to apologize for the confusion and the inconvenience. I then assured him that I will personally reach out to his insurance to see if PA is needed.  I am not familiar with this test so I had to reach out to the infusion clinic and the pre-service center to get the correct CPT code, but was unable to do so being they do not handle that part of this particular test. I did a little research and found (2) codes that I pray is correct (16109 & 96361).  I then called [employer] ( to see if a PA is required and spoke with Sheril B @ 8:45am. I gave her the NPI for the rendering site [facility] [id number]) along with the ordering physician's NPI (Dr. Lonzo Cloud [id number]) and the CPT codes that I found (13086 & 96361). After she ran everything through she informed me based upon this information given :  PRE-CERT is NOT REQ   CALL REF # [id number]  I then called the patient back to relay this information and he was surprised no auth was needed, but was grateful.  He asked the question, "what do I do if I am charged?" I reassured him that everything was documented in the call with Monia Pouch and will also be documented in his chart so he should be covered but if there was any additional questions to feel free to contact me directly. I gave him my direct line and asked him to please get the person's name and contact information when and if he receives any additional calls regarding this test. He agreed and  said thanks!

## 2022-08-25 ENCOUNTER — Other Ambulatory Visit (HOSPITAL_COMMUNITY): Payer: Self-pay | Admitting: *Deleted

## 2022-08-25 DIAGNOSIS — Z0001 Encounter for general adult medical examination with abnormal findings: Secondary | ICD-10-CM

## 2022-08-25 NOTE — Telephone Encounter (Signed)
Please see the MyChart message reply(ies) for my assessment and plan.    I agree with Efraim Kaufmann, lets try decreasing the dose to half tablet of the olmesartan and see how you do.  I am also comfortable with you doing the saline loading test.  Thank you for the update.    This patient gave consent for this Medical Advice Message and is aware that it may result in a bill to [employer], as well as the possibility of receiving a bill for a co-payment or deductible. They are an established patient, but are not seeking medical advice exclusively about a problem treated during an in person or video visit in the last seven days. I did not recommend an in person or video visit within seven days of my reply.    I spent a total of 6 minutes cumulative time within 7 days through [employer].  Donato Schultz, MD

## 2022-08-27 DIAGNOSIS — M25551 Pain in right hip: Secondary | ICD-10-CM | POA: Diagnosis not present

## 2022-08-28 ENCOUNTER — Ambulatory Visit (HOSPITAL_COMMUNITY)
Admission: RE | Admit: 2022-08-28 | Discharge: 2022-08-28 | Disposition: A | Discharge status: Home / Self Care | Payer: Medicare HMO | Source: Ambulatory Visit | Attending: Internal Medicine | Admitting: Internal Medicine

## 2022-08-28 DIAGNOSIS — Z0001 Encounter for general adult medical examination with abnormal findings: Secondary | ICD-10-CM | POA: Insufficient documentation

## 2022-08-28 LAB — CORTISOL
Cortisol, Plasma: 15.5 ug/dL
Cortisol, Plasma: 3.8 ug/dL

## 2022-08-28 MED ORDER — CLONIDINE HCL 0.1 MG PO TABS
ORAL_TABLET | ORAL | Status: AC
  Filled 2022-08-28: qty 1

## 2022-08-28 MED ORDER — CLONIDINE HCL 0.1 MG PO TABS
0.1000 mg | ORAL_TABLET | Freq: Once | ORAL | Status: AC
  Administered 2022-08-28: 0.1 mg via ORAL

## 2022-08-28 MED ORDER — SODIUM CHLORIDE 0.9 % IV SOLN
Freq: Once | INTRAVENOUS | Status: AC
  Administered 2022-08-28: 2000 mL via INTRAVENOUS

## 2022-08-31 DIAGNOSIS — M25551 Pain in right hip: Secondary | ICD-10-CM | POA: Diagnosis not present

## 2022-09-02 LAB — ALDOSTERONE
Aldosterone: 15.8 ng/dL (ref 0.0–30.0)
Aldosterone: 4.8 ng/dL (ref 0.0–30.0)

## 2022-09-04 DIAGNOSIS — M25551 Pain in right hip: Secondary | ICD-10-CM | POA: Diagnosis not present

## 2022-09-15 ENCOUNTER — Ambulatory Visit: Payer: Medicare HMO | Attending: Cardiovascular Disease | Admitting: Pharmacist

## 2022-09-15 VITALS — BP 130/70 | HR 64

## 2022-09-15 DIAGNOSIS — I10 Essential (primary) hypertension: Secondary | ICD-10-CM | POA: Diagnosis not present

## 2022-09-15 NOTE — Progress Notes (Signed)
Patient ID: Patrick Floyd                 DOB: May 09, 1945                      MRN: [id number]      HPI: Patrick Floyd is a 77 y.o. male referred by Dr. Anne Fu to HTN clinic. PMH is significant for hypertrophic cardiomyopathy, T2DM, ulcerative colitis, hyperlipidemia.   At last appointment with PharmD, BP was very elevated. Patient reported home BP around 150-160/80-90. Back in Feb patient was started on omlesartan and indapamide by PCP but patient stopped taking stating they made him tired. Olmesartan 10mg  daily was resumed, which was later increased to 20mg  daily. He was also started on rosuvastatin 10mg  which he reported doing well on. Renal function and K stable on ARB.   Of note patient did have low renin with elevated Aldo/PRA ratio (prior to ARB use). He is followed by endocrinology who will be doing further testing to rule in or out hyperaldosteronism. If he is positive for hyperaldosteronism he will need to be started on spironolactone. His repeat aldosterone/renin labs were not conclusive and he has been scheduled for CT to evaluate adrenal glands.   I saw patient is clinic 4/25. No changes were made at appointment, but after his labs resulted and I discussed with Dr. Lonzo Cloud, olmesartan was increased to 40mg  daily. He contacted me via mychart starting that he had a headache, felt a little dizzy with exercise and nauseous. We decided to wait a little longer to see if symptoms would go away.    I saw patient in clinic 5/16. Blood pressure was 128/68. No medication changes were made. Patient contacted me via mychart 6/25 requesting to decrease omlesartan due to tiredness, nausea dizziness and weakness that he contributes to olmesartan.   Patient presents today for from.  He brings in a list of blood pressures which are mainly in the low 130s to low 140s over 60s and 70s.  And occasional readings in the 120s or 150s systolic.  Over the last week or 2 he has started going back to the  gym couple days a week.  He also has completed physical therapy reports that sometimes he does his exercises at home and sometimes he does not.   Had his saline infusion test and was found not to have hyperaldosteronism. He does feel a little better on the lower dose of olmesartan.  Blood pressures have not changed much on the lower dose.  Does still have some dizziness with the movement of his neck especially when lying on his back.  He does not remember why he did not tolerate indapamide.  Admits that he only took it for couple days.  Considered changing Toprol to carvedilol which is still a possibility but patient does have hypertrophic cardiomyopathy and if he developed obstruction this would not be ideal.  Same thing could be said about diuretics however I do feel as a spironolactone might be the next best option for him medication wise.  Patient would prefer not to add additional medications although he does understand that the best range for his blood pressure, if we can achieve without side effects, would be less than 130/80.  He used to exercise frequently until his hip started bothering him.  He has gone through physical therapy and is resuming exercise.  Home blood pressure cuff previously found to be accurate. Home wrist cuff:146/69 HR 66 Clinic cuff 148/68  Current mediations: amlodipine 10 mg daily, Toprol XL 50 mg daily, olmesartan 20mg  daily Previously tried mediations: olmesartan, indapamide (didn't react well) BP goal: <130/80   Family History:  Mother- heart attack  Father - HTN, heart attack  Siblings- colitis, cardiomyopathy    Social History:  Alcohol: occasional 1-2 drinks per week Smoking: quit 42 years ago   Diet: eats home cooked meals most days of the week eats out once or twice week can reduce that to every other day.  Advised to cut down on the salt intake     Exercise: walking 2 miles  3-4 times per week lately unable to walk due to hip pain    Home BP  readings:  Mainly low 130's-140's/60-70's  Lab Results  Component Value Date   CHOL 206 (H) 12/23/2021   HDL 49.10 12/23/2021   LDLCALC 137 (H) 12/23/2021   LDLDIRECT 166.7 08/24/2012   TRIG 101.0 12/23/2021   CHOLHDL 4 12/23/2021     Wt Readings from Last 3 Encounters:  08/28/22 197 lb 3.2 oz (89.4 kg)  08/12/22 201 lb (91.2 kg)  07/23/22 203 lb (92.1 kg)   BP Readings from Last 3 Encounters:  09/15/22 130/70  08/28/22 113/68  08/12/22 134/82   Pulse Readings from Last 3 Encounters:  09/15/22 64  08/28/22 (!) 54  08/12/22 67    Renal function: CrCl cannot be calculated (Patient's most recent lab result is older than the maximum 21 days allowed.).  Past Medical History:  Diagnosis Date   Diverticulosis of colon    GERD (gastroesophageal reflux disease)    watches diet   Hemorrhoids, internal    History of adenomatous polyp of colon    History of anal fissures    History of basal cell carcinoma    History of COVID-19 01/2021   per pt mild symptoms that resolved   History of primary hyperparathyroidism 01/2016   endocrinologist--- dr Lonzo Cloud;  s/p right superior parathyroidectomy 06/ 2018,  adenoma   Hyperlipidemia    Hypertension    Hypertrophic cardiomyopathy [facility])    cardiologist--- dr Anne Fu   IBS (irritable bowel syndrome)    Inguinal hernia, bilateral    Nocturia    PONV (postoperative nausea and vomiting)    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     acetaminophen (TYLENOL) 500 MG  tablet Take 500 mg by mouth every 6 (six) hours as needed.     amLODipine (NORVASC) 10 MG tablet TAKE 1 TABLET BY MOUTH EVERY DAY 90 tablet 3   diclofenac (VOLTAREN) 75 MG EC tablet Take 75 mg by mouth daily.     Mesalamine (ASACOL) 400 MG CPDR DR capsule Take 3 capsules (1,200 mg total) by mouth daily. 270 capsule 3   metFORMIN (GLUCOPHAGE-XR) 500 MG 24 hr tablet Take 1 tablet (500 mg total) by mouth daily with breakfast. 90 tablet 3   metoprolol succinate (TOPROL-XL) 50 MG 24 hr tablet TAKE WITH OR IMMEDIATELY FOLLOWING  A MEAL EVERY DAY 90 tablet 3   rosuvastatin (CRESTOR) 20 MG tablet Take 1 tablet (20 mg total) by mouth daily. 90 tablet 3   VITAMIN D PO Take by mouth daily.     No current facility-administered medications on file prior to visit.    No Known Allergies  Blood pressure 130/70, pulse 64.   Assessment/Plan:  1. Hypertension -  Essential hypertension Assessment: Blood pressure is not consistently less than 130/80 However certainly has improved and is now much closer to goal Just recently resumed exercise.  For the last 2 weeks he is gone to the gym 2 days and worked out about an hour to an hour and a half Patient and I had a long conversation about his blood pressure goals medication use and the effects of exercise and around strengthening the muscles around his hip  Plan: We have decided to hold off on medication changes at this time Focus on increasing back exercise including both cardio and strength training Continue olmesartan at the lower dose of 20 mg daily along with amlodipine 10 mg daily and metoprolol 50 mg daily Follow-up in clinic in about 6 weeks.  Agreement with patient that if blood pressure still not at goal we will make medication changes at that time      Thank you Olene Floss, Pharm.D, BCACP, BCPS, CPP [facility] [address], [place], [place] [address]  Phone: [phone]; Fax:  [phone]

## 2022-09-15 NOTE — Assessment & Plan Note (Signed)
Assessment: Blood pressure is not consistently less than 130/80 However certainly has improved and is now much closer to goal Just recently resumed exercise.  For the last 2 weeks he is gone to the gym 2 days and worked out about an hour to an hour and a half Patient and I had a long conversation about his blood pressure goals medication use and the effects of exercise and around strengthening the muscles around his hip  Plan: We have decided to hold off on medication changes at this time Focus on increasing back exercise including both cardio and strength training Continue olmesartan at the lower dose of 20 mg daily along with amlodipine 10 mg daily and metoprolol 50 mg daily Follow-up in clinic in about 6 weeks.  Agreement with patient that if blood pressure still not at goal we will make medication changes at that time

## 2022-09-15 NOTE — Patient Instructions (Addendum)
Continue to work on increasing exercise Continue monitoring blood pressure

## 2022-09-23 ENCOUNTER — Encounter: Payer: Self-pay | Admitting: Internal Medicine

## 2022-10-07 DIAGNOSIS — M1611 Unilateral primary osteoarthritis, right hip: Secondary | ICD-10-CM | POA: Diagnosis not present

## 2022-10-07 DIAGNOSIS — M7061 Trochanteric bursitis, right hip: Secondary | ICD-10-CM | POA: Diagnosis not present

## 2022-10-26 NOTE — Progress Notes (Unsigned)
Patient ID: QUANTAVIOUS SCHILLO                 DOB: 1945/04/11                      MRN: [id number]      HPI: Patrick Floyd is a 77 y.o. male referred by Dr. Anne Fu to HTN clinic. PMH is significant for hypertrophic cardiomyopathy, T2DM, ulcerative colitis, hyperlipidemia.   At last appointment with PharmD, BP was very elevated. Patient reported home BP around 150-160/80-90. Back in Feb patient was started on omlesartan and indapamide by PCP but patient stopped taking stating they made him tired. Olmesartan 10mg  daily was resumed, which was later increased to 20mg  daily. He was also started on rosuvastatin 10mg  which he reported doing well on. Renal function and K stable on ARB.   Of note patient did have low renin with elevated Aldo/PRA ratio (prior to ARB use). He is followed by endocrinology who will be doing further testing to rule in or out hyperaldosteronism. If he is positive for hyperaldosteronism he will need to be started on spironolactone. His repeat aldosterone/renin labs were not conclusive and he has been scheduled for CT to evaluate adrenal glands.   I saw patient is clinic 4/25. No changes were made at appointment, but after his labs resulted and I discussed with Dr. Lonzo Cloud, olmesartan was increased to 40mg  daily. He contacted me via mychart starting that he had a headache, felt a little dizzy with exercise and nauseous. We decided to wait a little longer to see if symptoms would go away.    I saw patient in clinic 5/16. Blood pressure was 128/68. No medication changes were made. Patient contacted me via mychart 6/25 requesting to decrease omlesartan due to tiredness, nausea dizziness and weakness that he contributes to olmesartan.   Last pharmacist visit on 7/16 patient brought in log of blood pressures which are mainly in the low 130s to low 140s over 60s and 70s.  And occasional readings in the 120s or 150s systolic.  Had started going to the gym twice a week for about one hour  each time and had completed physical therapy. Had his saline infusion test and was found not to have hyperaldosteronism. Was feeling better on the lower dose of olmesartan. Endorsed still having some dizziness with the movement of his neck especially when lying on his back.  He does not remember why he did not tolerate indapamide.  Admits that he only took it for couple days.  Considered changing Toprol to carvedilol which is still a possibility but patient does have hypertrophic cardiomyopathy and if he developed obstruction this would not be ideal.  Same thing could be said about diuretics however I do feel as a spironolactone might be the next best option for him medication wise.  Patient would prefer not to add additional medications although he does understand that the best range for his blood pressure, if we can achieve without side effects, would be less than 130/80.  He used to exercise frequently until his hip started bothering him.  He has gone through physical therapy and is resuming exercise. Discussed that if BP elevated at follow up visit would need to make medication changes.  Home blood pressure cuff previously found to be accurate. Home wrist cuff:146/69 HR 66 Clinic cuff 148/68  Current mediations: amlodipine 10 mg daily, Toprol XL 50 mg daily, olmesartan 20mg  daily Previously tried mediations: olmesartan 40 mg, indapamide (didn't  react well) BP goal: <130/80   Family History:  Mother- heart attack  Father - HTN, heart attack  Siblings- colitis, cardiomyopathy    Social History:  Alcohol: occasional 1-2 drinks per week Smoking: quit 42 years ago   Diet: eats home cooked meals most days of the week eats out once or twice week can reduce that to every other day.  Advised to cut down on the salt intake     Exercise: walking 2 miles  3-4 times per week lately unable to walk due to hip pain    Home BP readings:  Mainly low 130's-140's/60-70's  Lab Results  Component  Value Date   CHOL 206 (H) 12/23/2021   HDL 49.10 12/23/2021   LDLCALC 137 (H) 12/23/2021   LDLDIRECT 166.7 08/24/2012   TRIG 101.0 12/23/2021   CHOLHDL 4 12/23/2021     Wt Readings from Last 3 Encounters:  08/28/22 197 lb 3.2 oz (89.4 kg)  08/12/22 201 lb (91.2 kg)  07/23/22 203 lb (92.1 kg)   BP Readings from Last 3 Encounters:  09/15/22 130/70  08/28/22 113/68  08/12/22 134/82   Pulse Readings from Last 3 Encounters:  09/15/22 64  08/28/22 (!) 54  08/12/22 67    Renal function: CrCl cannot be calculated (Patient's most recent lab result is older than the maximum 21 days allowed.).  Past Medical History:  Diagnosis Date   Diverticulosis of colon    GERD (gastroesophageal reflux disease)    watches diet   Hemorrhoids, internal    History of adenomatous polyp of colon    History of anal fissures    History of basal cell carcinoma    History of COVID-19 01/2021   per pt mild symptoms that resolved   History of primary hyperparathyroidism 01/2016   endocrinologist--- dr Lonzo Cloud;  s/p right superior parathyroidectomy 06/ 2018,  adenoma   Hyperlipidemia    Hypertension    Hypertrophic cardiomyopathy [facility])    cardiologist--- dr Anne Fu   IBS (irritable bowel syndrome)    Inguinal hernia, bilateral    Nocturia    PONV (postoperative nausea and vomiting)    RBBB (right bundle branch block with left anterior fascicular block)    Seasonal allergies    Type 2 diabetes mellitus (HCC)    followed by dr Lonzo Cloud (endocrinologist);;  (06-25-2021 pt stated does not check blood sugar at home)   Ulcerative colitis, left sided (HCC)    followed by dr stark (GI)   Umbilical hernia    Wears glasses     Current Outpatient Medications on File Prior to Visit  Medication Sig Dispense Refill   acetaminophen (TYLENOL) 500 MG tablet Take 500 mg by mouth every 6 (six) hours as needed.     amLODipine (NORVASC) 10 MG tablet TAKE 1 TABLET BY MOUTH EVERY DAY 90 tablet 3    diclofenac (VOLTAREN) 75 MG EC tablet Take 75 mg by mouth daily.     Mesalamine (ASACOL) 400 MG CPDR DR capsule Take 3 capsules (1,200 mg total) by mouth daily. 270 capsule 3   metFORMIN (GLUCOPHAGE-XR) 500 MG 24 hr tablet Take 1 tablet (500 mg total) by mouth daily with breakfast. 90 tablet 3   metoprolol succinate (TOPROL-XL) 50 MG 24 hr tablet TAKE WITH OR IMMEDIATELY FOLLOWING A MEAL EVERY DAY 90 tablet 3   olmesartan (BENICAR) 20 MG tablet Take 1 tablet (20 mg total) by mouth daily.     rosuvastatin (CRESTOR) 20 MG tablet Take 1 tablet (20 mg total)  by mouth daily. 90 tablet 3   VITAMIN D PO Take by mouth daily.     No current facility-administered medications on file prior to visit.    No Known Allergies  There were no vitals taken for this visit.  Assessment: Blood pressure is not consistently less than 130/80 However certainly has improved and is now much closer to goal Just recently resumed exercise.  For the last 2 weeks he is gone to the gym 2 days and worked out about an hour to an hour and a half Patient and I had a long conversation about his blood pressure goals medication use and the effects of exercise and around strengthening the muscles around his hip   Plan: We have decided to hold off on medication changes at this time Focus on increasing back exercise including both cardio and strength training Continue olmesartan at the lower dose of 20 mg daily along with amlodipine 10 mg daily and metoprolol 50 mg daily Follow-up in clinic in about 6 weeks.  Agreement with patient that if blood pressure still not at goal we will make medication changes at that time  Assessment/Plan:  1. Hypertension -  No problem-specific Assessment & Plan notes found for this encounter.

## 2022-10-27 ENCOUNTER — Ambulatory Visit: Payer: Medicare HMO | Attending: Cardiology | Admitting: Pharmacist

## 2022-10-27 VITALS — BP 154/80 | HR 62

## 2022-10-27 DIAGNOSIS — E785 Hyperlipidemia, unspecified: Secondary | ICD-10-CM

## 2022-10-27 DIAGNOSIS — I10 Essential (primary) hypertension: Secondary | ICD-10-CM | POA: Diagnosis not present

## 2022-10-27 MED ORDER — CARVEDILOL 3.125 MG PO TABS: 3.1250 mg | ORAL_TABLET | Freq: Two times a day (BID) | ORAL | 3 refills | Status: DC

## 2022-10-27 NOTE — Patient Instructions (Signed)
Stop taking metoprolol and start taking carvedilol 3.125 mg twice a day.  Please continue to keep a log of your blood pressure at home and bring to office visits.

## 2022-10-28 ENCOUNTER — Encounter: Payer: Self-pay | Admitting: Internal Medicine

## 2022-10-29 DIAGNOSIS — M1611 Unilateral primary osteoarthritis, right hip: Secondary | ICD-10-CM | POA: Diagnosis not present

## 2022-10-29 DIAGNOSIS — M7601 Gluteal tendinitis, right hip: Secondary | ICD-10-CM | POA: Diagnosis not present

## 2022-11-27 ENCOUNTER — Ambulatory Visit (HOSPITAL_BASED_OUTPATIENT_CLINIC_OR_DEPARTMENT_OTHER)
Admission: RE | Admit: 2022-11-27 | Discharge: 2022-11-27 | Disposition: A | Discharge status: Home / Self Care | Payer: Medicare HMO | Source: Ambulatory Visit | Attending: Cardiology | Admitting: Cardiology

## 2022-11-27 DIAGNOSIS — I77819 Aortic ectasia, unspecified site: Secondary | ICD-10-CM | POA: Insufficient documentation

## 2022-11-27 DIAGNOSIS — J9811 Atelectasis: Secondary | ICD-10-CM | POA: Diagnosis not present

## 2022-11-27 DIAGNOSIS — I7121 Aneurysm of the ascending aorta, without rupture: Secondary | ICD-10-CM | POA: Diagnosis not present

## 2022-11-27 DIAGNOSIS — I517 Cardiomegaly: Secondary | ICD-10-CM | POA: Diagnosis not present

## 2022-11-27 DIAGNOSIS — I700 Atherosclerosis of aorta: Secondary | ICD-10-CM | POA: Diagnosis not present

## 2022-11-27 LAB — POCT I-STAT CREATININE: Creatinine, Ser: 1 mg/dL (ref 0.61–1.24)

## 2022-11-27 MED ORDER — IOHEXOL 350 MG/ML SOLN
100.0000 mL | Freq: Once | INTRAVENOUS | Status: AC | PRN
  Administered 2022-11-27: 100 mL via INTRAVENOUS

## 2022-11-29 ENCOUNTER — Encounter: Payer: Self-pay | Admitting: Pharmacist

## 2022-11-30 ENCOUNTER — Other Ambulatory Visit: Payer: Self-pay | Admitting: *Deleted

## 2022-11-30 DIAGNOSIS — I77819 Aortic ectasia, unspecified site: Secondary | ICD-10-CM

## 2022-12-08 ENCOUNTER — Encounter: Payer: Self-pay | Admitting: Cardiology

## 2022-12-08 ENCOUNTER — Ambulatory Visit: Payer: Medicare HMO | Attending: Cardiology | Admitting: Cardiology

## 2022-12-08 VITALS — BP 168/80 | HR 62 | Ht 72.0 in | Wt 204.4 lb

## 2022-12-08 DIAGNOSIS — I10 Essential (primary) hypertension: Secondary | ICD-10-CM

## 2022-12-08 DIAGNOSIS — E785 Hyperlipidemia, unspecified: Secondary | ICD-10-CM

## 2022-12-08 MED ORDER — NEBIVOLOL HCL 10 MG PO TABS: 10.0000 mg | ORAL_TABLET | Freq: Every day | ORAL | 3 refills | Status: DC

## 2022-12-08 NOTE — Patient Instructions (Signed)
Medication Instructions:  Please discontinue Olmesartan and Carvedilol. Start Bystolic 10 mg once a day. Continue all other medications as listed.  *If you need a refill on your cardiac medications before your next appointment, please call your pharmacy*   Lab Work: Please have blood work today. (Lipid)  If you have labs (blood work) drawn today and your tests are completely normal, you will receive your results only by: MyChart Message (if you have MyChart) OR A paper copy in the mail If you have any lab test that is abnormal or we need to change your treatment, we will call you to review the results.  Testing: Your physician has requested that you have an echocardiogram in 1 year. Echocardiography is a painless test that uses sound waves to create images of your heart. It provides your doctor with information about the size and shape of your heart and how well your heart's chambers and valves are working. This procedure takes approximately one hour. There are no restrictions for this procedure. Please do NOT wear cologne, perfume, aftershave, or lotions (deodorant is allowed). Please arrive 15 minutes prior to your appointment time.  Follow-Up: At [facility], you and your health needs are our priority.  As part of our continuing mission to provide you with exceptional heart care, we have created designated Provider Care Teams.  These Care Teams include your primary Cardiologist (physician) and Advanced Practice Providers (APPs -  Physician Assistants and Nurse Practitioners) who all work together to provide you with the care you need, when you need it.  We recommend signing up for the patient portal called "MyChart".  Sign up information is provided on this After Visit Summary.  MyChart is used to connect with patients for Virtual Visits (Telemedicine).  Patients are able to view lab/test results, encounter notes, upcoming appointments, etc.  Non-urgent messages can be sent to  your provider as well.   To learn more about what you can do with MyChart, go to [email or web address].    Your next appointment:   2 month(s)  Provider:   Malena Peer in HTN Clinic

## 2022-12-08 NOTE — Progress Notes (Signed)
Cardiology Office Note:  .   Date:  12/08/2022  ID:  Patrick Floyd, DOB [birth year], MRN [id number] PCP: Etta Grandchild, MD   HeartCare Providers Cardiologist:  Donato Schultz, MD     History of Present Illness: .   Patrick Floyd is a 77 y.o. male Discussed with the use of AI scribe software  History of Present Illness   The patient is a 77 year old individual with a history of hypertension, hypertrophic cardiomyopathy, type 2 diabetes, ulcerative colitis, and hyperlipidemia. He has been under the care of a hypertension clinic and has been on a regimen of amlodipine, olmesartan, and carvedilol. However, the patient reports experiencing side effects such as lightheadedness and dizziness, which have been particularly pronounced with the recent addition of carvedilol.  The patient's blood pressure readings at home have been mostly in the 130s, but there have been instances of readings in the 140s and 150s. The patient expresses concern about these elevated readings and his potential impact on his health. He also reports a history of high cholesterol, with an LDL cholesterol level of 137 in October 2023. The patient admits to inconsistent use of Crestor, a cholesterol-lowering medication, due to perceived side effects, including increased urination at night.  In addition to these cardiovascular concerns, the patient has been dealing with hip pain, for which he has been taking diclofenac. However, he expresses a willingness to discontinue this medication due to concerns about its potential impact on blood pressure.  The patient's most recent CT scan showed mild dilation of the aorta (4 cm), which has remained stable. An echocardiogram in 2018 showed an ejection fraction of 65% with no valvular abnormalities, but a pattern of severe left ventricular hypertrophy. An event monitor in October 2023 showed no adverse arrhythmias.  The patient is actively trying to manage his health through  regular exercise, including walking two to three miles several times a week. However, he expresses frustration with the number of medications he is taking and the side effects he is experiencing. He expresses a desire for a simpler, more effective treatment plan.           Studies Reviewed: Marland Kitchen   EKG Interpretation Date/Time:  Tuesday December 08 2022 11:37:59 EDT Ventricular Rate:  62 PR Interval:  184 QRS Duration:  132 QT Interval:  452 QTC Calculation: 458 R Axis:   -26  Text Interpretation: Normal sinus rhythm Right bundle branch block Minimal voltage criteria for LVH, may be normal variant ( R in aVL ) When compared with ECG of 20-Mar-2016 12:46, No significant change was found Confirmed by Donato Schultz ([id number]) on 12/08/2022 11:50:07 AM    Results LABS LDL cholesterol: 137 (11/2021)  RADIOLOGY CT scan of the aorta: Mild dilatation, 4 cm, stable (11/27/2022)  DIAGNOSTIC Echocardiogram: Ejection fraction 65%, no valvular abnormalities, severe LVH (2018) Event monitor: No significant arrhythmias (11/2021)  Risk Assessment/Calculations:           Physical Exam:   VS:  BP (!) 168/80   Pulse 62   Ht 6' (1.829 m)   Wt 204 lb 6.4 oz (92.7 kg)   SpO2 95%   BMI 27.72 kg/m    Wt Readings from Last 3 Encounters:  12/08/22 204 lb 6.4 oz (92.7 kg)  08/28/22 197 lb 3.2 oz (89.4 kg)  08/12/22 201 lb (91.2 kg)    GEN: Well nourished, well developed in no acute distress NECK: No JVD; No carotid bruits CARDIAC: RRR, no murmurs, no rubs, no gallops  RESPIRATORY:  Clear to auscultation without rales, wheezing or rhonchi  ABDOMEN: Soft, non-tender, non-distended EXTREMITIES:  No edema; No deformity   ASSESSMENT AND PLAN: .    Assessment and Plan    Hypertension Home blood pressure readings in the 130s, but some recent readings in the 140s. Patient reports side effects with current regimen (amlodipine 10mg  daily, olmesartan 20mg  daily, carvedilol 3.125mg  twice daily he stopped  previously). -Discontinue olmesartan. -Start Bystolic (nebivolol), 10 mg.  Hyperlipidemia Patient on Crestor, but LDL cholesterol was 137 in October 2023. Discussed potential side effects and the importance of cholesterol management. -Continue Crestor. -Check lipid panel today.  Aortic Dilation CT scan on 11/27/2022 showed mild dilation of the aorta at 4cm, stable. Discussed the implications and management plan. -Plan for echocardiogram next year to monitor aortic dilation and assess cardiac function.  Hip Pain Patient taking diclofenac for hip pain, which may be contributing to elevated blood pressure. -Discontinue diclofenac. -Consider scheduled acetaminophen for pain management.  Hypertrophic Cardiomyopathy Stable with EF of 65% on echocardiogram in 2018. No adverse arrhythmias on event monitor in October 2023. -Continue current management. -Plan for echocardiogram next year to assess cardiac function.             Follow up with Mellisa in BP clinic.   Signed, Donato Schultz, MD

## 2022-12-09 LAB — LIPID PANEL
Chol/HDL Ratio: 3.1 {ratio} (ref 0.0–5.0)
Cholesterol, Total: 154 mg/dL (ref 100–199)
HDL: 50 mg/dL (ref >39)
LDL Chol Calc (NIH): 88 mg/dL (ref 0–99)
Triglycerides: 85 mg/dL (ref 0–149)
VLDL Cholesterol Cal: 16 mg/dL (ref 5–40)

## 2022-12-14 ENCOUNTER — Telehealth (INDEPENDENT_AMBULATORY_CARE_PROVIDER_SITE_OTHER): Payer: Medicare HMO | Admitting: Family Medicine

## 2022-12-14 VITALS — BP 148/74 | Ht 72.0 in | Wt 203.0 lb

## 2022-12-14 DIAGNOSIS — U071 COVID-19: Secondary | ICD-10-CM | POA: Diagnosis not present

## 2022-12-14 MED ORDER — NIRMATRELVIR/RITONAVIR (PAXLOVID)TABLET
3.0000 | ORAL_TABLET | Freq: Two times a day (BID) | ORAL | 0 refills | Status: AC
Start: 2022-12-14 — End: 2022-12-19

## 2022-12-14 NOTE — Progress Notes (Signed)
Virtual Visit via Video Note  I connected with Patrick Floyd on 12/14/22 at 4:37 PM by a video enabled telemedicine application and verified that I am speaking with the correct person using two identifiers.  Patient location: home by self.  My location: office - [place].    I discussed the limitations, risks, security and privacy concerns of performing an evaluation and management service by telephone and the availability of in person appointments. I also discussed with the patient that there may be a patient responsible charge related to this service. The patient expressed understanding and agreed to proceed, consent obtained  Chief complaint:  Chief Complaint  Patient presents with   Covid Positive    Pt reports he tested Saturday and today positive Sx started Friday  12/11/2022 sore throat. Today sx today cough,nasal congestion,body aches,sore throat. Pt reports last night unable to sleep last night. Pt reports B/P from home.    History of Present Illness: Patrick Floyd is a 77 y.o. male  COVID-19 infection: Sore throat started 3 days ago. Negative cod test 2 days ago, Persistent sore throat, cough, some nasal congestion. Congestion last night. Trouble sleeping last night. Positive covid test today.  Possible sick contact - grandson.  No CP, no dyspnea, congestion only last night.  No confusion. Drinking fluids.   Tx: tylenol only. Has coricidin HBP if needed.   Has completed the COVID-19 vaccine series and multiple boosters, no recent booster. Last covid infection 01/2021.   Chart reviewed, history of diabetes, hypertrophic cardiomyopathy, obesity, ulcerative colitis.  eGFR 94 on 06/03/2022, normal creatinine of 1.0 on 11/27/2022. He is on statin with Crestor 20 mg daily, last dose 2 nights ago.  Also on amlodipine 10 mg daily.   Patient Active Problem List   Diagnosis Date Noted   Encounter for general adult medical examination with abnormal findings  04/27/2022   Current moderate episode of major depressive disorder without prior episode (HCC) 01/19/2022   Type 2 diabetes mellitus without complication, without long-term current use of insulin (HCC) 06/25/2021   Inguinal hernia of right side without obstruction or gangrene 03/31/2021   Hypertrophic cardiomyopathy (HCC) 02/10/2021   Hyperparathyroidism (HCC) 09/28/2018   Right bundle branch block 03/12/2016   Vitamin D deficiency 02/18/2016   Alkaline phosphatase elevation 02/17/2016   Benign prostatic hyperplasia without lower urinary tract symptoms 02/17/2016   Nonspecific abnormal electrocardiogram (ECG) (EKG) 02/17/2016   OBESITY, CLASS I 11/13/2009   Type II diabetes mellitus with manifestations (HCC) 09/14/2008   Hyperlipidemia with target LDL less than 100 06/20/2007   Essential hypertension 06/20/2007   Ulcerative colitis (HCC) 06/20/2007   Past Medical History:  Diagnosis Date   Diverticulosis of colon    GERD (gastroesophageal reflux disease)    watches diet   Hemorrhoids, internal    History of adenomatous polyp of colon    History of anal fissures    History of basal cell carcinoma    History of COVID-19 01/2021   per pt mild symptoms that resolved   History of primary hyperparathyroidism 01/2016   endocrinologist--- dr Lonzo Cloud;  s/p right superior parathyroidectomy 06/ 2018,  adenoma   Hyperlipidemia    Hypertension    Hypertrophic cardiomyopathy [facility])    cardiologist--- dr Anne Fu   IBS (irritable bowel syndrome)    Inguinal hernia, bilateral    Nocturia    PONV (postoperative nausea and vomiting)    RBBB (right bundle branch block with left anterior fascicular block)    Seasonal allergies  Type 2 diabetes mellitus (HCC)    followed by dr Lonzo Cloud (endocrinologist);;  (06-25-2021 pt stated does not check blood sugar at home)   Ulcerative colitis, left sided [facility])    followed by dr stark (GI)   Umbilical hernia    Wears glasses    Past Surgical  History:  Procedure Laterality Date   COLONOSCOPY WITH PROPOFOL  05/27/2020   by dr stark   INGUINAL HERNIA REPAIR Bilateral 06/27/2021   Procedure: LAPAROSCOPIC BILATERAL INGUINAL HERNIA REPAIR;  Surgeon: Karie Soda, MD;  Location: [facility];  Service: General;  Laterality: Bilateral;   MASS EXCISION Left 02/12/2017   Procedure: EXCISION 6x6 CM BACK MASS;  Surgeon: Luretha Murphy, MD;  Location: [facility];  Service: General;  Laterality: Left;   PARATHYROIDECTOMY Right 08/20/2016   @[facility] ;  right superior   (adenoma)   UMBILICAL HERNIA REPAIR  06/27/2021   Procedure: PRIMARY UMBILICAL HERNIA REPAIR;  Surgeon: Karie Soda, MD;  Location: [facility];  Service: General;;   No Known Allergies Prior to Admission medications   Medication Sig Start Date End Date Taking? Authorizing Provider  acetaminophen (TYLENOL) 500 MG tablet Take 500 mg by mouth every 6 (six) hours as needed.   Yes [provider]  amLODipine (NORVASC) 10 MG tablet TAKE 1 TABLET BY MOUTH EVERY DAY 03/13/22  Yes Jake Bathe, MD  diclofenac (VOLTAREN) 75 MG EC tablet Take 75 mg by mouth daily.   Yes [provider]  Mesalamine (ASACOL) 400 MG CPDR DR capsule Take 3 capsules (1,200 mg total) by mouth daily. 07/23/22  Yes Meryl Dare, MD  metFORMIN (GLUCOPHAGE-XR) 500 MG 24 hr tablet Take 1 tablet (500 mg total) by mouth daily with breakfast. 03/16/22  Yes Shamleffer, Konrad Dolores, MD  nebivolol (BYSTOLIC) 10 MG tablet Take 1 tablet (10 mg total) by mouth daily. 12/08/22  Yes Jake Bathe, MD  rosuvastatin (CRESTOR) 20 MG tablet Take 1 tablet (20 mg total) by mouth daily. 05/19/22  Yes Jake Bathe, MD  VITAMIN D PO Take by mouth daily.   Yes [provider]   Social History   Socioeconomic History   Marital status: Married    Spouse name: Bonita Quin   Number of children: Not on file   Years of education: college   Highest education level: Bachelor's degree  (e.g., BA, AB, BS)  Occupational History   Not on file  Tobacco Use   Smoking status: Former    Current packs/day: 0.00    Types: Cigarettes    Start date: 12/05/1974    Quit date: 12/05/1979    Years since quitting: 43.0   Smokeless tobacco: Never  Vaping Use   Vaping status: Never Used  Substance and Sexual Activity   Alcohol use: Not Currently    Comment: rare   Drug use: Never   Sexual activity: Not on file  Other Topics Concern   Not on file  Social History Narrative   Engineer, maintenance (IT), married with 2 daughters - 1 finished college in Engineer, mining Studies, 1 in college (7/10). 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 Marriage is in good health. Work is usual amount of stress.    Social Determinants of Health   Financial Resource Strain: Low Risk  (12/14/2022)   Overall Financial Resource Strain (CARDIA)    Difficulty of Paying Living Expenses: Not hard at all  Food Insecurity: No Food Insecurity (12/14/2022)   Hunger Vital  Sign    Worried About Programme researcher, broadcasting/film/video in the Last Year: Never true    Ran Out of Food in the Last Year: Never true  Transportation Needs: No Transportation Needs (12/14/2022)   PRAPARE - Administrator, Civil Service (Medical): No    Lack of Transportation (Non-Medical): No  Physical Activity: Insufficiently Active (12/14/2022)   Exercise Vital Sign    Days of Exercise per Week: 3 days    Minutes of Exercise per Session: 40 min  Stress: No Stress Concern Present (12/14/2022)   [employer] of Occupational Health - Occupational Stress Questionnaire    Feeling of Stress : Only a little  Social Connections: Unknown (12/14/2022)   Social Connection and Isolation Panel [NHANES]    Frequency of Communication with Friends and Family: More than three times a week    Frequency of Social Gatherings with Friends and Family: Three times a week    Attends Religious Services: Patient declined     Active Member of Clubs or Organizations: Yes    Attends Banker Meetings: 1 to 4 times per year    Marital Status: Married  Catering manager Violence: Not At Risk (01/26/2022)   Humiliation, Afraid, Rape, and Kick questionnaire    Fear of Current or Ex-Partner: No    Emotionally Abused: No    Physically Abused: No    Sexually Abused: No    Observations/Objective: Vitals:   12/14/22 1557 12/14/22 1622  BP: (!) 162/95 (!) 148/74  Weight: 203 lb (92.1 kg)   Height: 6' (1.829 m)   Nontoxic appearance on video.  Speaking in full sentences without respiratory distress.  No audible wheeze or stridor.  No significant cough during video visit, appropriate responses.  All questions were answered with understanding of plan expressed   Assessment and Plan: COVID-19 virus infection - Plan: nirmatrelvir/ritonavir (PAXLOVID) 20 x 150 MG & 10 x 100MG  TABS  -Day 3 of COVID-19 infection.  Mild symptoms at this time.  Appears to be appropriate for continued outpatient treatment with ER precautions given.  I will discuss with potential side effects and risks versus potential benefits with his health history.  Chose to try Paxlovid.  Normal renal function, full dose.  Stop statin for 1 week, monitor blood pressure on calcium channel blocker with option to decrease dose slightly if lower readings on Paxlovid.  Symptomatic care with expectorants, fluids, rest, ER precautions.  Masking, contact precautions discussed.  All questions answered.  Follow Up Instructions: Patient Instructions  Sorry you are sick but as we discussed I expect you to continue to have fairly mild symptoms.  I sent the prescription for the antiviral to your pharmacy.  Make sure to avoid taking the rosuvastatin for 1 week, and option to decrease amlodipine to half dose temporarily if blood pressures are running lower which can sometimes occur with that medication.  If you have significant side effects from the Paxlovid okay  to discontinue.  Mucinex or Coricidin HBP for cough, Tylenol if needed for body aches or fever, make sure to drink plenty of fluids and rest.  Be seen through an urgent care or ER if any shortness of breath at rest, confusion, chest pain or other acute worsening symptoms but I do not expect these to occur.  Hope you feel better soon.  Dr. Neva Seat    I discussed the assessment and treatment plan with the patient. The patient was provided an opportunity to ask questions and all were answered.  The patient agreed with the plan and demonstrated an understanding of the instructions.   The patient was advised to call back or seek an in-person evaluation if the symptoms worsen or if the condition fails to improve as anticipated.   Shade Flood, MD

## 2022-12-14 NOTE — Patient Instructions (Addendum)
Sorry you are sick but as we discussed I expect you to continue to have fairly mild symptoms.  I sent the prescription for the antiviral to your pharmacy.  Make sure to avoid taking the rosuvastatin for 1 week, and option to decrease amlodipine to half dose temporarily if blood pressures are running lower which can sometimes occur with that medication.  If you have significant side effects from the Paxlovid okay to discontinue.  Mucinex or Coricidin HBP for cough, Tylenol if needed for body aches or fever, make sure to drink plenty of fluids and rest.  Be seen through an urgent care or ER if any shortness of breath at rest, confusion, chest pain or other acute worsening symptoms but I do not expect these to occur.  Hope you feel better soon.  Dr. Neva Seat

## 2022-12-15 ENCOUNTER — Encounter: Payer: Self-pay | Admitting: Cardiology

## 2022-12-28 ENCOUNTER — Encounter: Payer: Self-pay | Admitting: Internal Medicine

## 2022-12-28 ENCOUNTER — Ambulatory Visit (INDEPENDENT_AMBULATORY_CARE_PROVIDER_SITE_OTHER): Payer: Medicare HMO | Admitting: Internal Medicine

## 2022-12-28 VITALS — Ht 72.0 in | Wt 202.0 lb

## 2022-12-28 DIAGNOSIS — D3501 Benign neoplasm of right adrenal gland: Secondary | ICD-10-CM

## 2022-12-28 DIAGNOSIS — E785 Hyperlipidemia, unspecified: Secondary | ICD-10-CM

## 2022-12-28 DIAGNOSIS — Z7984 Long term (current) use of oral hypoglycemic drugs: Secondary | ICD-10-CM | POA: Diagnosis not present

## 2022-12-28 DIAGNOSIS — D3502 Benign neoplasm of left adrenal gland: Secondary | ICD-10-CM | POA: Diagnosis not present

## 2022-12-28 DIAGNOSIS — E119 Type 2 diabetes mellitus without complications: Secondary | ICD-10-CM | POA: Diagnosis not present

## 2022-12-28 LAB — POCT GLYCOSYLATED HEMOGLOBIN (HGB A1C): Hemoglobin A1C: 6.2 % — AB (ref 4.0–5.6)

## 2022-12-28 LAB — POCT GLUCOSE (DEVICE FOR HOME USE): POC Glucose: 122 mg/dL — AB (ref 70–99)

## 2022-12-28 MED ORDER — METFORMIN HCL ER 500 MG PO TB24: 500.0000 mg | ORAL_TABLET | Freq: Every day | ORAL | 3 refills | Status: DC

## 2022-12-28 NOTE — Patient Instructions (Signed)
24-Hour Urine Collection   You will be collecting your urine for a 24-hour period of time.  Your timer starts with your first urine of the morning (For example - If you first pee at 9AM, your timer will start at 9AM)  Throw away your first urine of the morning  Collect your urine every time you pee for the next 24 hours STOP your urine collection 24 hours after you started the collection (For example - You would stop at 9AM the day after you started)  

## 2022-12-28 NOTE — Progress Notes (Signed)
Name: Patrick Floyd  Age/ Sex: 77 y.o., male   MRN/ DOB: [id number], [birth year]     PCP: Etta Grandchild, MD   Reason for Endocrinology Evaluation: Type 2 Diabetes Mellitus  Initial Endocrine Consultative Visit: 03/23/2016    PATIENT IDENTIFIER: Patrick Floyd is a 77 y.o. male with a past medical history of T2Dm, HTN , UC ocular migraines and dyslipidemia . The patient has followed with Endocrinology clinic since 03/23/2016 for consultative assistance with management of his diabetes.  DIABETIC HISTORY:  Patrick Floyd was diagnosed with DM in 2014, he has been on metformin since his diagnosis.  His hemoglobin A1c has ranged from 6.1% in 2023, peaking at 8.6% in 2019.  HYPERPARATHYROID HISTORY:  He was diagnosed with hyperparathyroidism in 2010.  He is status post parathyroidectomy in 2018  Last DXA 2018-low bone density    ADRENAL HISTORY: He had an abnormal renin with elevated Aldo/renin ratio 04/2022, which was checked during evaluation of HTN 180/88  mmhg, he was not on ARB at the time due to dizziness   He was initially reluctant with invasive testing  We opted to proceed with adrenal imaging prior to confirmation His renin normalized on olmesartan    Adrenal imaging 08/2022 revealed 15 mm right adrenal adenoma, 12 mm left adrenal adenoma   Saline loading ruled out hyperaldosteronism with postinfusion aldosterone of 4.8 NG/DL 10/31/5174  SUBJECTIVE:   During the last visit (08/12/2022): A1c 6.1%    Today (12/28/2022): Patrick Floyd is here to discuss his most recent CT scan of the abdomen showing bilateral adrenal nodules.  He continues to follow-up with cardiology for hypertrophic cardiomyopathy and HTN  Recovered from COVID   Denies constipation or diarrhea  Denies palpitations  Denies pre-syncope but has noted occasional dizziness with antihypertensives   HOME ENDOCRINE REGIMEN:  Metformin 500 mg XR daily  Rosuvatstain 20 mg, half a tablet       Statin: not taking crestor  ACE-I/ARB: No    METER DOWNLOAD SUMMARY:    DIABETIC COMPLICATIONS: Microvascular complications:   Denies: CKD, neuropathy, retinopathy  Last Eye Exam: Completed 07/23/2022  Macrovascular complications:   Denies: CAD, CVA, PVD   HISTORY:  Past Medical History:  Past Medical History:  Diagnosis Date   Diverticulosis of colon    GERD (gastroesophageal reflux disease)    watches diet   Hemorrhoids, internal    History of adenomatous polyp of colon    History of anal fissures    History of basal cell carcinoma    History of COVID-19 01/2021   per pt mild symptoms that resolved   History of primary hyperparathyroidism 01/2016   endocrinologist--- dr Lonzo Cloud;  s/p right superior parathyroidectomy 06/ 2018,  adenoma   Hyperlipidemia    Hypertension    Hypertrophic cardiomyopathy [facility])    cardiologist--- dr Anne Fu   IBS (irritable bowel syndrome)    Inguinal hernia, bilateral    Nocturia    PONV (postoperative nausea and vomiting)    RBBB (right bundle branch block with left anterior fascicular block)    Seasonal allergies    Type 2 diabetes mellitus (HCC)    followed by dr Lonzo Cloud (endocrinologist);;  (06-25-2021 pt stated does not check blood sugar at home)   Ulcerative colitis, left sided (HCC)    followed by dr stark (GI)   Umbilical hernia    Wears glasses    Past Surgical History:  Past Surgical History:  Procedure Laterality Date   COLONOSCOPY WITH PROPOFOL  05/27/2020   by dr stark   INGUINAL HERNIA REPAIR Bilateral 06/27/2021   Procedure: LAPAROSCOPIC BILATERAL INGUINAL HERNIA REPAIR;  Surgeon: Karie Soda, MD;  Location: [facility];  Service: General;  Laterality: Bilateral;   MASS EXCISION Left 02/12/2017   Procedure: EXCISION 6x6 CM BACK MASS;  Surgeon: Luretha Murphy, MD;  Location: [facility];  Service: General;  Laterality: Left;   PARATHYROIDECTOMY Right 08/20/2016   @[facility] ;  right superior    (adenoma)   UMBILICAL HERNIA REPAIR  06/27/2021   Procedure: PRIMARY UMBILICAL HERNIA REPAIR;  Surgeon: Karie Soda, MD;  Location: [facility] [place];  Service: General;;   Social History:  reports that he quit smoking about 43 years ago. His smoking use included cigarettes. He started smoking about 48 years ago. He has never used smokeless tobacco. He reports that he does not currently use alcohol. He reports that he does not use drugs. Family History:  Family History  Problem Relation Age of Onset   Hypertension Other    Breast cancer Mother    Heart disease Father    Hypertrophic cardiomyopathy Sister    Colon cancer Neg Hx    Stomach cancer Neg Hx    Cancer Neg Hx        Colon and Prostate   Hyperparathyroidism Neg Hx    Colon polyps Neg Hx    Esophageal cancer Neg Hx    Rectal cancer Neg Hx      HOME MEDICATIONS: Allergies as of 12/28/2022   No Known Allergies      Medication List        Accurate as of December 28, 2022 10:03 AM. If you have any questions, ask your nurse or doctor.          acetaminophen 500 MG tablet Commonly known as: TYLENOL Take 500 mg by mouth every 6 (six) hours as needed.   amLODipine 10 MG tablet Commonly known as: NORVASC TAKE 1 TABLET BY MOUTH EVERY DAY   diclofenac 75 MG EC tablet Commonly known as: VOLTAREN Take 75 mg by mouth daily.   Mesalamine 400 MG Cpdr DR capsule Commonly known as: ASACOL Take 3 capsules (1,200 mg total) by mouth daily.   metFORMIN 500 MG 24 hr tablet Commonly known as: GLUCOPHAGE-XR Take 1 tablet (500 mg total) by mouth daily with breakfast.   nebivolol 10 MG tablet Commonly known as: Bystolic Take 1 tablet (10 mg total) by mouth daily.   rosuvastatin 20 MG tablet Commonly known as: CRESTOR Take 1 tablet (20 mg total) by mouth daily.   VITAMIN D PO Take by mouth daily.         OBJECTIVE:   Vital Signs: Ht 6' (1.829 m)   Wt 202 lb (91.6 kg)   BMI 27.40 kg/m   Wt Readings  from Last 3 Encounters:  12/28/22 202 lb (91.6 kg)  12/14/22 203 lb (92.1 kg)  12/08/22 204 lb 6.4 oz (92.7 kg)     Exam: General: Pt appears well and is in NAD  Lungs: Clear with good BS bilat   Heart: RRR   Abdomen:  soft, nontender  Extremities: Trace  pretibial edema.   Neuro: MS is good with appropriate affect, pt is alert and Ox3    DM foot exam: 12/28/2022  The skin of the feet is intact without sores or ulcerations. The pedal pulses are 2+ on right and 2+ on left. The sensation is intact to a screening 5.07, 10 gram monofilament bilaterally  DATA REVIEWED:  Lab Results  Component Value Date   HGBA1C 6.2 (A) 12/28/2022   HGBA1C 6.2 (A) 06/24/2022   HGBA1C 6.4 04/27/2022    Latest Reference Range & Units 12/08/22 12:29  Total CHOL/HDL Ratio 0.0 - 5.0 ratio 3.1  Cholesterol, Total 100 - 199 mg/dL 737  HDL Cholesterol >10 mg/dL 50  Triglycerides 0 - 626 mg/dL 85  VLDL Cholesterol Cal 5 - 40 mg/dL 16  LDL Chol Calc (NIH) 0 - 99 mg/dL 88      CT abdomen 11/03/8544 Adrenals/Urinary Tract: Enhancing 15 mm right adrenal nodule measures Hounsfield units of 3 pre contrast administration fifty-five postcontrast administration and 14 on delayed compatible with a benign adrenal adenoma.   Enhancing 12 mm left adrenal nodule measures Hounsfield units of 11 pre contrast administration sixty-six postcontrast administration and 16 on delayed compatible with a benign adrenal adenoma.   No hydronephrosis. Punctate nonobstructive bilateral renal stones. Kidneys demonstrate symmetric enhancement.  ASSESSMENT / PLAN / RECOMMENDATIONS:   1) Bilateral adrenal adenoma:    -Saline loading ruled out hyperaldosteronism 08/2022 -Will proceed with screened for cortisol and catecholamines   2)Type 2 Diabetes Mellitus, optimally controlled, With out complications - Most recent A1c of 6.2%. Goal A1c <7.0%.     -A1c has been at goal -No changes   MEDICATIONS: Continue   metformin 500 mg XR daily    2) Diabetic complications:  Eye: Does not have known diabetic retinopathy.  Neuro/ Feet: Does not have known diabetic peripheral neuropathy .  Renal: Patient does not have known baseline CKD. He is intolerant to losartan.  We discussed renal benefits of ARB and ACE inhibitors   3) Dyslipidemia:  -LDL has improved  -Most recent CT scan indicated aortic atherosclerosis -LDL has improved    Medication Continue rosuvastatin 20 mg, half a tablet at bedtime  Follow-up in 6 months Signed electronically by: Lyndle Herrlich, MD  [facility] Endocrinology  [facility] [address] [place] [place], [place] [address] Phone: [phone] FAX: [phone]   CC: Etta Grandchild, MD [address] [place] [address] Phone: [phone]  Fax: [phone]  Return to Endocrinology clinic as below: Future Appointments  Date Time Provider Department Center  12/28/2022 10:10 AM Patrick Floyd, Patrick Dolores, MD [id number] None  02/09/2023 10:30 AM [facility] PHARMACIST [facility]

## 2023-01-08 ENCOUNTER — Encounter: Payer: Self-pay | Admitting: Internal Medicine

## 2023-02-05 ENCOUNTER — Other Ambulatory Visit: Payer: Self-pay

## 2023-02-05 ENCOUNTER — Other Ambulatory Visit: Payer: Medicare HMO

## 2023-02-05 DIAGNOSIS — R7989 Other specified abnormal findings of blood chemistry: Secondary | ICD-10-CM

## 2023-02-05 DIAGNOSIS — E119 Type 2 diabetes mellitus without complications: Secondary | ICD-10-CM

## 2023-02-09 ENCOUNTER — Ambulatory Visit: Payer: Medicare HMO | Attending: Cardiology | Admitting: Pharmacist

## 2023-02-09 VITALS — BP 140/78 | HR 64

## 2023-02-09 DIAGNOSIS — I10 Essential (primary) hypertension: Secondary | ICD-10-CM

## 2023-02-09 MED ORDER — IRBESARTAN 75 MG PO TABS: 75.0000 mg | ORAL_TABLET | Freq: Every day | ORAL | 11 refills | Status: DC

## 2023-02-09 NOTE — Patient Instructions (Addendum)
Take metoprolol 25mg  (1/2 tablet) daily for 4 days then stop START irbesartan 75mg  daily Continue amlodipine 10mg  daily  [facility] [address] [place] [place],  [place]  [address] Main: [phone]

## 2023-02-09 NOTE — Progress Notes (Signed)
Patient ID: Patrick Floyd                 DOB: [birth year]                      MRN: [id number]      HPI: Patrick Floyd is a 77 y.o. male referred by Dr. Anne Fu to HTN clinic. PMH is significant for hypertrophic cardiomyopathy, T2DM, ulcerative colitis, hyperlipidemia, hyperparathyroidism s/p parathyroidectomy. He has been undergoing a workup with endocrinology. Hyperaldosteronism was ruled out. Adrenal imaging 08/2022 revealed 15 mm right adrenal adenoma, 12 mm left adrenal adenoma. Waiting on results of 24hr urine collection.  Back in Feb patient was started on omlesartan and indapamide by PCP but patient stopped taking stating they made him tired. Olmesartan 10mg  daily was resumed, which was later increased to 20mg  daily. He was also started on rosuvastatin 10mg  which he reported doing well on. Renal function and K stable on ARB.   I saw patient is clinic 4/25. No changes were made at appointment, but after his labs resulted and I discussed with Dr. Lonzo Cloud, olmesartan was increased to 40mg  daily. He contacted me via mychart starting that he had a headache, felt a little dizzy with exercise and nauseous. We decided to wait a little longer to see if symptoms would go away.    I saw patient in clinic 5/16. Blood pressure was 128/68. No medication changes were made. Patient contacted me via mychart 6/25 requesting to decrease omlesartan due to tiredness, nausea dizziness and weakness that he contributes to olmesartan.   Seen 7/16, patient refused medication changes. Increased physical activity/intensity was encouraged. On 8/27 metoprolol was changed to carvedilol.  Olmesartan and carvedilol stopped at appointment with Dr. Anne Fu 10/8.  Nebivolol 10 mg daily started.  Patient presents today to clinic. He stopped taking Nebivolol because it made him feel off balance and his legs weak when he walked. He also did not think it helped his blood pressure. He went back to taking metoprolol.  He has  been on a beta blocker ever since he started seeing a cardiologist. No hx of afib or SVT. He does have HCM- non obstructive and no symptoms. Patient would like to minimize the amount of medications he is on. He reports some anxiety and often times has dizziness. I wonder if some of this is from his beta blocker. I discussed with Dr. Izora Ribas and no reason he needs to be on beta blocker.   He has increased his intensity at the gym and is exercising frequently. Does feel his BP goes up with stress. Just finished his 24hr urine collection for catacholamines and cortisol. No results yet.  Home blood pressure cuff previously found to be accurate. Home wrist cuff:146/69 HR 66, Clinic cuff 148/68   Current mediations: amlodipine 10 mg daily (AM), metoprolol succinate 50mg  Previously tried mediations: olmesartan 40 mg, indapamide (didn't react well), metoprolol, carvedilol, nebivolol (leg weakness). Hydrochlorothiazide, indapamide (didn't like the way it made him feel) BP goal: <130/80   Family History:  Mother- heart attack  Father - HTN, heart attack  Siblings- colitis, cardiomyopathy    Social History:  Alcohol: occasional 1-2 drinks per week Smoking: quit 42 years ago   Diet: eats home cooked meals most days of the week eats out once or twice week can reduce that to every other day. Limits salt intake    Exercise: 30-45 minutes on bike or elliptical 3x/week, has been doing some strength training  Home BP readings:  138/69, 138/72, 130/72, 151/81, 149/76, 140/77, 142/81  Lab Results  Component Value Date   CHOL 154 12/08/2022   HDL 50 12/08/2022   LDLCALC 88 12/08/2022   LDLDIRECT 166.7 08/24/2012   TRIG 85 12/08/2022   CHOLHDL 3.1 12/08/2022     Wt Readings from Last 3 Encounters:  12/28/22 202 lb (91.6 kg)  12/14/22 203 lb (92.1 kg)  12/08/22 204 lb 6.4 oz (92.7 kg)   BP Readings from Last 3 Encounters:  12/14/22 (!) 148/74  12/08/22 (!) 168/80  10/27/22 (!) 154/80    Pulse Readings from Last 3 Encounters:  12/08/22 62  10/27/22 62  09/15/22 64    Renal function: CrCl cannot be calculated (Patient's most recent lab result is older than the maximum 21 days allowed.).  Past Medical History:  Diagnosis Date   Diverticulosis of colon    GERD (gastroesophageal reflux disease)    watches diet   Hemorrhoids, internal    History of adenomatous polyp of colon    History of anal fissures    History of basal cell carcinoma    History of COVID-19 01/2021   per pt mild symptoms that resolved   History of primary hyperparathyroidism 01/2016   endocrinologist--- dr Lonzo Cloud;  s/p right superior parathyroidectomy 06/ 2018,  adenoma   Hyperlipidemia    Hypertension    Hypertrophic cardiomyopathy [facility])    cardiologist--- dr Anne Fu   IBS (irritable bowel syndrome)    Inguinal hernia, bilateral    Nocturia    PONV (postoperative nausea and vomiting)    RBBB (right bundle branch block with left anterior fascicular block)    Seasonal allergies    Type 2 diabetes mellitus (HCC)    followed by dr Lonzo Cloud (endocrinologist);;  (06-25-2021 pt stated does not check blood sugar at home)   Ulcerative colitis, left sided (HCC)    followed by dr stark (GI)   Umbilical hernia    Wears glasses     Current Outpatient Medications on File Prior to Visit  Medication Sig Dispense Refill   acetaminophen (TYLENOL) 500 MG tablet Take 500 mg by mouth every 6 (six) hours as needed.     amLODipine (NORVASC) 10 MG tablet TAKE 1 TABLET BY MOUTH EVERY DAY 90 tablet 3   diclofenac (VOLTAREN) 75 MG EC tablet Take 75 mg by mouth daily.     Mesalamine (ASACOL) 400 MG CPDR DR capsule Take 3 capsules (1,200 mg total) by mouth daily. 270 capsule 3   metFORMIN (GLUCOPHAGE-XR) 500 MG 24 hr tablet Take 1 tablet (500 mg total) by mouth daily with breakfast. 90 tablet 3   nebivolol (BYSTOLIC) 10 MG tablet Take 1 tablet (10 mg total) by mouth daily. 90 tablet 3   rosuvastatin  (CRESTOR) 20 MG tablet Take 1 tablet (20 mg total) by mouth daily. 90 tablet 3   VITAMIN D PO Take by mouth daily.     No current facility-administered medications on file prior to visit.    No Known Allergies  There were no vitals taken for this visit.  Assessment/Plan:  1. Hypertension -  Assessment: Blood pressure above goal of <130/80 at home and in office. Did not tolerate nebivolol Has several medication intolerances. Patient states he doesn't know if he gave them enough time but did try to take all the medications for several days before he stopped them Willing to try another ARB Still taking diclofenac. Has tried to stop but then he hurts all over. We  discussed the CV, bleeding and kidney risks of prolonged NSAID use. He takes daily instead of BID. He has to weigh the risks with his benefits. APAP does not work.  Suggested that celebrex might be a little safer  Plan: Stop metoprolol. First decreased to 25mg  for 4 days then stop START irbesartan 75mg  daily Continue amlodipine 10mg  daily BMP in 2-4 weeks F/u in office in 4-5 weeks   Benita Boonstra D Rhiannon Sassaman, Pharm.D, BCACP, BCPS, CPP [facility] A Division of [facility] [address] [place], [place] [address]  Phone: [phone]; Fax: [phone]

## 2023-02-11 LAB — CATECHOLAMINES, FRACTIONATED, URINE, 24 HOUR
Calc Total (E+NE): 42 ug/(24.h) (ref 26–121)
Creatinine, Urine mg/day-CATEUR: 1.31 g/(24.h) (ref 0.50–2.15)
Dopamine 24 Hr Urine: 205 ug/(24.h) (ref 52–480)
Norepinephrine, 24H, Ur: 42 ug/(24.h) (ref 15–100)
Total Volume: 1550 mL

## 2023-02-11 LAB — CORTISOL, FREE AND CORTISONE, 24 HOUR URINE W/CREATININE
24 Hour urine volume (VMAHVA): 1550 mL
CREATININE, URINE: 1.33 g/(24.h) (ref 0.50–2.15)
Cortisol (Ur), Free: 24.9 ug/(24.h) (ref 4.0–50.0)
Cortisone, 24H Ur: 63.2 ug/(24.h) (ref 23–195)

## 2023-02-12 ENCOUNTER — Encounter: Payer: Self-pay | Admitting: Internal Medicine

## 2023-02-17 ENCOUNTER — Encounter: Payer: Self-pay | Admitting: Gastroenterology

## 2023-02-19 ENCOUNTER — Encounter: Payer: Self-pay | Admitting: Pharmacist

## 2023-02-26 DIAGNOSIS — H6123 Impacted cerumen, bilateral: Secondary | ICD-10-CM | POA: Diagnosis not present

## 2023-03-05 ENCOUNTER — Other Ambulatory Visit: Payer: Self-pay | Admitting: Cardiology

## 2023-03-09 ENCOUNTER — Encounter: Payer: Self-pay | Admitting: Internal Medicine

## 2023-03-18 ENCOUNTER — Other Ambulatory Visit (HOSPITAL_COMMUNITY): Payer: Self-pay

## 2023-03-18 ENCOUNTER — Ambulatory Visit: Payer: Medicare HMO | Attending: Cardiovascular Disease | Admitting: Pharmacist

## 2023-03-18 ENCOUNTER — Encounter: Payer: Self-pay | Admitting: Internal Medicine

## 2023-03-18 VITALS — BP 150/78 | HR 80

## 2023-03-18 DIAGNOSIS — I10 Essential (primary) hypertension: Secondary | ICD-10-CM

## 2023-03-18 MED ORDER — METOPROLOL SUCCINATE ER 50 MG PO TB24
25.0000 mg | ORAL_TABLET | Freq: Every day | ORAL | 3 refills | Status: DC
  Filled 2023-03-18: qty 45, 90d supply, fill #0

## 2023-03-18 NOTE — Patient Instructions (Addendum)
Resume metoprolol succinate 25mg  daily Please resume irbesartan 150mg  daily Continue amlodipine 10mg  daily  Please come for lab work on Monday or Tuesday  Please send me blood pressure readings in 2 weeks

## 2023-03-18 NOTE — Progress Notes (Addendum)
 Patient ID: [name]                 DOB: 09-23-1945                      MRN: [id number]      HPI: Patrick Floyd Floyd is a 78 y.o. male referred by Dr. Anne Floyd to HTN clinic. PMH is significant for hypertrophic cardiomyopathy, T2DM, ulcerative colitis, hyperlipidemia, hyperparathyroidism s/p parathyroidectomy. He has been undergoing a workup with endocrinology. Hyperaldosteronism was ruled out. Adrenal imaging 08/2022 revealed 15 mm right adrenal adenoma, 12 mm left adrenal adenoma. Waiting on results of 24hr urine collection.  Back in Feb patient was started on omlesartan and indapamide by PCP but patient stopped taking stating they made him tired. Olmesartan 10mg  daily was resumed, which was later increased to 20mg  daily. He was also started on rosuvastatin 10mg  which he reported doing well on. Renal function and K stable on ARB.   I saw patient is clinic 4/25. No changes were made at appointment, but after his labs resulted and I discussed with Dr. Lonzo Floyd, olmesartan was increased to 40mg  daily. He contacted me via mychart starting that he had a headache, felt a little dizzy with exercise and nauseous. We decided to wait a little longer to see if symptoms would go away.    I saw patient in clinic 5/16. Blood pressure was 128/68. No medication changes were made. Patient contacted me via mychart 6/25 requesting to decrease omlesartan due to tiredness, nausea dizziness and weakness that he contributes to olmesartan.   Seen 7/16, patient refused medication changes. Increased physical activity/intensity was encouraged. On 8/27 metoprolol was changed to carvedilol.  Olmesartan and carvedilol stopped at appointment with Dr. Anne Floyd 10/8.  Nebivolol 10 mg daily started.  At last visit with PharmD, patient had stopped nebivolol (felt like it was affecting his balance and leg strength) and had gone back to metoprolol. I stopped his metoprolol thinking maybe it was contributing to his fatigue.  Irbesartan 75mg  was started. Via mychart, it was increased to 150mg  daily.   Patient messaged me a few days ago stating he had an episode where he had some palpitations and overall did not feel well after cleaning out a closet.  He sat down and felt better after about 20 minutes.  Also reported some palpitations in the evening.  Patient presents today for follow-up.  Brings in blood pressure readings.  Mainly 140s/70's and a few 130s.  Patient admits the last couple days he only took 75 mg of irbesartan with concerns that maybe he is not feeling well was associated with a higher dose of irbesartan.  He admits that he is not sure if the reason he does not feel well is from his medications.  We have tried several blood pressure medications all with the same results.  Home blood pressure cuff previously found to be accurate. Home wrist cuff:146/69 HR 66, Clinic cuff 148/68  Current mediations: amlodipine 10 mg daily (AM), irbesartan 150mg  daily Previously tried mediations: olmesartan 40 mg, indapamide (didn't react well), metoprolol, carvedilol, nebivolol (leg weakness). Hydrochlorothiazide, indapamide (didn't like the way it made him feel) BP goal: <130/80   Family History:  Mother- heart attack  Father - HTN, heart attack  Siblings- colitis, cardiomyopathy    Social History:  Alcohol: occasional 1-2 drinks per week Smoking: quit 42 years ago   Diet: eats home cooked meals most days of the week eats out once or twice  week can reduce that to every other day. Limits salt intake    Exercise: 30-45 minutes on bike or elliptical 3x/week, has been doing some strength training   Home BP readings:  142/76, 140/79, 140/73, 143/84, 135/82, 137/73  Lab Results  Component Value Date   CHOL 154 12/08/2022   HDL 50 12/08/2022   LDLCALC 88 12/08/2022   LDLDIRECT 166.7 08/24/2012   TRIG 85 12/08/2022   CHOLHDL 3.1 12/08/2022     Wt Readings from Last 3 Encounters:  12/28/22 202 lb (91.6 kg)   12/14/22 203 lb (92.1 kg)  12/08/22 204 lb 6.4 oz (92.7 kg)   BP Readings from Last 3 Encounters:  03/18/23 (!) 150/78  02/09/23 (!) 140/78  12/14/22 (!) 148/74   Pulse Readings from Last 3 Encounters:  03/18/23 80  02/09/23 64  12/08/22 62    Renal function: CrCl cannot be calculated (Patient's most recent lab result is older than the maximum 21 days allowed.).  Past Medical History:  Diagnosis Date   Diverticulosis of colon    GERD (gastroesophageal reflux disease)    watches diet   Hemorrhoids, internal    History of adenomatous polyp of colon    History of anal fissures    History of basal cell carcinoma    History of COVID-19 01/2021   per pt mild symptoms that resolved   History of primary hyperparathyroidism 01/2016   endocrinologist--- dr Patrick Floyd Floyd;  s/p right superior parathyroidectomy 06/ 2018,  adenoma   Hyperlipidemia    Hypertension    Hypertrophic cardiomyopathy [facility])    cardiologist--- dr Patrick Floyd Floyd   IBS (irritable bowel syndrome)    Inguinal hernia, bilateral    Nocturia    PONV (postoperative nausea and vomiting)    RBBB (right bundle branch block with left anterior fascicular block)    Seasonal allergies    Type 2 diabetes mellitus (HCC)    followed by dr Patrick Floyd Floyd (endocrinologist);;  (06-25-2021 pt stated does not check blood sugar at home)   Ulcerative colitis, left sided (HCC)    followed by dr stark (GI)   Umbilical hernia    Wears glasses     Current Outpatient Medications on File Prior to Visit  Medication Sig Dispense Refill   acetaminophen (TYLENOL) 500 MG tablet Take 500 mg by mouth every 6 (six) hours as needed.     amLODipine (NORVASC) 10 MG tablet TAKE 1 TABLET BY MOUTH EVERY DAY 90 tablet 2   diclofenac (VOLTAREN) 75 MG EC tablet Take 75 mg by mouth daily.     irbesartan (AVAPRO) 75 MG tablet Take 2 tablets (150 mg total) by mouth daily.     Mesalamine (ASACOL) 400 MG CPDR DR capsule Take 3 capsules (1,200 mg total) by mouth  daily. 270 capsule 3   metFORMIN (GLUCOPHAGE-XR) 500 MG 24 hr tablet Take 1 tablet (500 mg total) by mouth daily with breakfast. 90 tablet 3   rosuvastatin (CRESTOR) 20 MG tablet Take 1 tablet (20 mg total) by mouth daily. 90 tablet 3   VITAMIN D PO Take by mouth daily.     No current facility-administered medications on file prior to visit.    No Known Allergies  Blood pressure (!) 150/78, pulse 80.  Assessment/Plan:  1. Hypertension -  Assessment: Blood pressure is elevated in clinic today.  Patient has only taken off 75 mg irbesartan for the last couple days. Home blood pressure readings are slightly better than clinic readings Discussed with patient that although we have  tried many medications we have not truly stuck with 1 regimen.  And often times instead of adding we have been replacing.  He may unfortunately require multiple medications Due to his episode of palpitations and the fact that he had been on a beta-blocker for many years, will resume metoprolol at 25 mg daily It seems like some of patient's blood pressure issues may be related to anxiety which patient admits may be true He has been going to the gym but admits that his activity level due to the cold is little bit less He has been trying to take diclofenac infrequently however he does have a lot of aches and pains when he does not take it.  We talked about the risks and the benefits of using diclofenac  Plan: I have asked patient to resume taking irbesartan 150 mg daily I have asked the patient to go for repeat lab work next Monday or Tuesday Resume metoprolol succinate at 25 mg daily Continue amlodipine 10 mg daily I have asked patient to send me blood pressure readings in 2 weeks.  At that time as long as blood work is stable we will most likely increase irbesartan to 300 mg daily Follow-up in clinic in 1 month    Aunika Kirsten D Kendricks Reap, Pharm.D, BCACP, CPP [facility] Division of [facility] [address] [place], [place] [address]  Phone: [phone]; Fax: [phone]

## 2023-03-18 NOTE — Assessment & Plan Note (Signed)
Assessment: Blood pressure is elevated in clinic today.  Patient has only taken off 75 mg irbesartan for the last couple days. Home blood pressure readings are slightly better than clinic readings Discussed with patient that although we have tried many medications we have not truly stuck with 1 regimen.  And often times instead of adding we have been replacing.  He may unfortunately require multiple medications Due to his episode of palpitations and the fact that he had been on a beta-blocker for many years, will resume metoprolol at 25 mg daily It seems like some of patient's blood pressure issues may be related to anxiety which patient admits may be true He has been going to the gym but admits that his activity level due to the cold is little bit less He has been trying to take diclofenac infrequently however he does have a lot of aches and pains when he does not take it.  We talked about the risks and the benefits of using diclofenac  Plan: I have asked patient to resume taking irbesartan 150 mg daily I have asked the patient to go for repeat lab work next Monday or Tuesday Resume metoprolol succinate at 25 mg daily Continue amlodipine 10 mg daily I have asked patient to send me blood pressure readings in 2 weeks.  At that time as long as blood work is stable we will most likely increase irbesartan to 300 mg daily Follow-up in clinic in 1 month

## 2023-03-23 DIAGNOSIS — I10 Essential (primary) hypertension: Secondary | ICD-10-CM | POA: Diagnosis not present

## 2023-03-24 LAB — BASIC METABOLIC PANEL
BUN/Creatinine Ratio: 30 — ABNORMAL HIGH (ref 10–24)
BUN: 27 mg/dL (ref 8–27)
CO2: 26 mmol/L (ref 20–29)
Calcium: 9.6 mg/dL (ref 8.6–10.2)
Chloride: 99 mmol/L (ref 96–106)
Creatinine, Ser: 0.91 mg/dL (ref 0.76–1.27)
Glucose: 163 mg/dL — ABNORMAL HIGH (ref 70–99)
Potassium: 4.5 mmol/L (ref 3.5–5.2)
Sodium: 141 mmol/L (ref 134–144)
eGFR: 88 mL/min/{1.73_m2} (ref >59)

## 2023-03-25 ENCOUNTER — Encounter: Payer: Self-pay | Admitting: Pharmacist

## 2023-03-25 MED ORDER — IRBESARTAN 150 MG PO TABS: 150.0000 mg | ORAL_TABLET | Freq: Every day | ORAL | 3 refills | Status: DC

## 2023-03-25 NOTE — Telephone Encounter (Signed)
Dr.Jones, please advise. Patients second message

## 2023-04-09 NOTE — Progress Notes (Signed)
 Chief Complaint: follow up on Ulcerative colitis, establish care Primary GI Doctor: (Previously Dr. Sandrea Cruel) Dr. Willy Harvest  HPI: 78 year old male with left sided ulcerative colitis, previously known to Dr. Sandrea Cruel, who presents for follow-up on ulcerative colitis and establish care with new doctor. Referred to Dr. Willy Harvest.  Patient last seen by Dr. Sandrea Cruel on 07/23/22 and at that time asymptomatic. Patient decreased his mesalamine  to 1.2g every morning. Colonoscopy recommended in March 2025.  Colonoscopy Mar 2022 - Two 4 to 6 mm polyps in the rectum, removed with a cold snare. Resected and retrieved.  - Moderate diverticulosis in the left colon.  - Internal hemorrhoid - Otherwise normal. Random biopsies.   Path: Hyperplastic polyp, normal random biopsies  08/04/22 CT ABD W and WO contrast IMPRESSION: 1. Bilateral benign adrenal adenomas. In the setting of hyperaldosteronism would suggest IR consult for adrenals venous sampling to excess laterality of functioning adenoma. 2. Punctate nonobstructive bilateral renal stones. 3. Colonic diverticulosis without findings of acute diverticulitis. 4. Large volume of formed stool in the colon. 5.  Aortic Atherosclerosis (ICD10-I70.0).  Interval History     Patient has history of Ulcerative colitis and has been on Mesalamine  1.2 grams for quite some time. Patient reports he recently had one episode of blood in stool and one episode of BRB with wiping. Two separate events few days apart, occurred about 4-5 weeks ago. No rectal itching or pain.  Patient reports one bowel movement every 1-2 days. He dnies straining or changes in bowel habits. Patient is on diclofenac 1 tablet po daily at bedtime for right hip pain. Imaging was normal.  Patient denies GERD or dysphagia. Patient denies nausea, vomiting, or weight loss. Patient has lost 40 lbs over the last 4 years intentionally for DM. Socially drinks alcohol. Nonsmoker. Family history of breast CA in mother.  Wt  Readings from Last 3 Encounters:  04/12/23 203 lb 2 oz (92.1 kg)  12/28/22 202 lb (91.6 kg)  12/14/22 203 lb (92.1 kg)    Past Medical History:  Diagnosis Date   Diverticulosis of colon    GERD (gastroesophageal reflux disease)    watches diet   Hemorrhoids, internal    History of adenomatous polyp of colon    History of anal fissures    History of basal cell carcinoma    History of COVID-19 01/2021   per pt mild symptoms that resolved   History of primary hyperparathyroidism 01/2016   endocrinologist--- dr Rosalea Collin;  s/p right superior parathyroidectomy 06/ 2018,  adenoma   Hyperlipidemia    Hypertension    Hypertrophic cardiomyopathy [facility])    cardiologist--- dr Renna Cary   IBS (irritable bowel syndrome)    Inguinal hernia, bilateral    Nocturia    PONV (postoperative nausea and vomiting)    RBBB (right bundle branch block with left anterior fascicular block)    Seasonal allergies    Type 2 diabetes mellitus (HCC)    followed by dr Rosalea Collin (endocrinologist);;  (06-25-2021 pt stated does not check blood sugar at home)   Ulcerative colitis, left sided [facility])    followed by dr stark (GI)   Umbilical hernia    Wears glasses     Past Surgical History:  Procedure Laterality Date   COLONOSCOPY WITH PROPOFOL   05/27/2020   by dr stark   INGUINAL HERNIA REPAIR Bilateral 06/27/2021   Procedure: LAPAROSCOPIC BILATERAL INGUINAL HERNIA REPAIR;  Surgeon: Candyce Champagne, MD;  Location: [facility];  Service: General;  Laterality: Bilateral;  MASS EXCISION Left 02/12/2017   Procedure: EXCISION 6x6 CM BACK MASS;  Surgeon: Jacolyn Matar, MD;  Location: [facility];  Service: General;  Laterality: Left;   PARATHYROIDECTOMY Right 08/20/2016   @[facility] ;  right superior   (adenoma)   UMBILICAL HERNIA REPAIR  06/27/2021   Procedure: PRIMARY UMBILICAL HERNIA REPAIR;  Surgeon: Candyce Champagne, MD;  Location: [facility];  Service: General;;    Current Outpatient  Medications  Medication Sig Dispense Refill   acetaminophen  (TYLENOL ) 500 MG tablet Take 500 mg by mouth every 6 (six) hours as needed.     amLODipine  (NORVASC ) 10 MG tablet TAKE 1 TABLET BY MOUTH EVERY DAY 90 tablet 2   diclofenac (VOLTAREN) 75 MG EC tablet Take 75 mg by mouth daily.     irbesartan  (AVAPRO ) 150 MG tablet Take 1 tablet (150 mg total) by mouth daily. 90 tablet 3   metFORMIN  (GLUCOPHAGE -XR) 500 MG 24 hr tablet Take 1 tablet (500 mg total) by mouth daily with breakfast. 90 tablet 3   metoprolol  succinate (TOPROL -XL) 50 MG 24 hr tablet Take 1/2 tablet (25 mg total) by mouth daily. Take with or immediately following a meal. 90 tablet 3   rosuvastatin  (CRESTOR ) 20 MG tablet Take 1 tablet (20 mg total) by mouth daily. 90 tablet 3   VITAMIN D  PO Take by mouth daily.     Mesalamine  (ASACOL ) 400 MG CPDR DR capsule Take 3 capsules (1,200 mg total) by mouth daily. 270 capsule 3   No current facility-administered medications for this visit.    Allergies as of 04/12/2023   (No Known Allergies)   Family History  Problem Relation Age of Onset   Hypertension Other    Breast cancer Mother    Heart disease Father    Hypertrophic cardiomyopathy Sister    Colon cancer Neg Hx    Stomach cancer Neg Hx    Cancer Neg Hx        Colon and Prostate   Hyperparathyroidism Neg Hx    Colon polyps Neg Hx    Esophageal cancer Neg Hx    Rectal cancer Neg Hx    Review of Systems:    Constitutional: No weight loss, fever, chills, weakness or fatigue HEENT: Eyes: No change in vision               Ears, Nose, Throat:  No change in hearing or congestion Skin: No rash or itching Cardiovascular: No chest pain, chest pressure or palpitations   Respiratory: No SOB or cough Gastrointestinal: See HPI and otherwise negative Genitourinary: No dysuria or change in urinary frequency Neurological: No headache, dizziness or syncope Musculoskeletal: No new muscle or joint pain Hematologic: No bleeding or  bruising Psychiatric: No history of depression or anxiety   Physical Exam:  Vital signs: BP 130/78   Pulse 75   Ht 6' (1.829 m)   Wt 203 lb 2 oz (92.1 kg)   BMI 27.55 kg/m  Constitutional: Pleasant Caucasian male appears to be in NAD, Well developed, Well nourished, alert and cooperative Throat: Oral cavity and pharynx without inflammation, swelling or lesion.  Respiratory: Respirations even and unlabored. Lungs clear to auscultation bilaterally.   No wheezes, crackles, or rhonchi.  Cardiovascular: Normal S1, S2. Regular rate and rhythm. No peripheral edema, cyanosis or pallor.  Gastrointestinal:  Soft, nondistended, nontender. No rebound or guarding. Normal bowel sounds. No appreciable masses or hepatomegaly. Rectal:  Not performed.  Msk:  Symmetrical without gross deformities. Without edema, no deformity or  joint abnormality.  Neurologic:  Alert and  oriented x4;  grossly normal neurologically.  Skin:   Dry and intact without significant lesions or rashes. Psychiatric: Oriented to person, place and time. Demonstrates good judgement and reason without abnormal affect or behaviors.  RELEVANT LABS AND IMAGING: CBC    Latest Ref Rng & Units 04/27/2022    9:31 AM 06/27/2021    8:06 AM 03/31/2021    9:42 AM  CBC  WBC 4.0 - 10.5 K/uL 7.7   8.8   Hemoglobin 13.0 - 17.0 g/dL 44.0  34.7  42.5   Hematocrit 39.0 - 52.0 % 40.3  45.0  41.7   Platelets 150.0 - 400.0 K/uL 287.0   252.0     CMP     Latest Ref Rng & Units 03/23/2023    2:50 PM 11/27/2022   11:15 AM 06/24/2022   10:45 AM  CMP  Glucose 70 - 99 mg/dL 956     BUN 8 - 27 mg/dL 27     Creatinine 3.87 - 1.27 mg/dL 5.64  3.32    Sodium 951 - 144 mmol/L 141     Potassium 3.5 - 5.2 mmol/L 4.5   3.9   Chloride 96 - 106 mmol/L 99     CO2 20 - 29 mmol/L 26     Calcium  8.6 - 10.2 mg/dL 9.6       Lab Results  Component Value Date   TSH 2.88 04/27/2022    Assessment: Encounter Diagnoses  Name Primary?   Left sided ulcerative  colitis without complication (HCC) Yes   Rectal bleeding       78 year old male patient with history of left sided Ulcerative colitis who presents for surveillance colon screening, will schedule today. He does endorse two separate episodes of blood noted on stool and toilet paper 4-5 weeks ago. Hx of internal hemorrhoids. Denies altered bowel habits or straining. He would like to wait for colonoscopy results to discuss with Dr. Willy Harvest options pending impression.   Plan: - Continue mesalamine  1.2gm po daily, refilled -Schedule for a colonoscopy in [facility] with Dr. Willy Harvest. The risks and benefits of colonoscopy with possible polypectomy / biopsies were discussed and the patient agrees to proceed.    Thank you for the courtesy of this consult. Please call me with any questions or concerns.   Dylin Breeden, FNP-C [facility] 04/12/2023, 2:54 PM  Cc: Arcadio Knuckles, MD

## 2023-04-12 ENCOUNTER — Ambulatory Visit (INDEPENDENT_AMBULATORY_CARE_PROVIDER_SITE_OTHER): Payer: Medicare HMO | Admitting: Gastroenterology

## 2023-04-12 VITALS — BP 130/78 | HR 75 | Ht 72.0 in | Wt 203.1 lb

## 2023-04-12 DIAGNOSIS — K625 Hemorrhage of anus and rectum: Secondary | ICD-10-CM | POA: Diagnosis not present

## 2023-04-12 DIAGNOSIS — Z8719 Personal history of other diseases of the digestive system: Secondary | ICD-10-CM | POA: Diagnosis not present

## 2023-04-12 DIAGNOSIS — K5150 Left sided colitis without complications: Secondary | ICD-10-CM

## 2023-04-12 MED ORDER — MESALAMINE 400 MG PO CPDR
1200.0000 mg | DELAYED_RELEASE_CAPSULE | Freq: Every day | ORAL | 3 refills | Status: AC
Start: 2023-04-12 — End: ?

## 2023-04-12 MED ORDER — SUFLAVE 178.7 G PO SOLR: 1.0000 | Freq: Once | ORAL | 0 refills | Status: AC

## 2023-04-12 NOTE — Patient Instructions (Signed)
 You have been scheduled for a colonoscopy. Please follow written instructions given to you at your visit today.   If you use inhalers (even only as needed), please bring them with you on the day of your procedure.  DO NOT TAKE 7 DAYS PRIOR TO TEST- Trulicity (dulaglutide) Ozempic, Wegovy (semaglutide) Mounjaro (tirzepatide) Bydureon Bcise (exanatide extended release)  DO NOT TAKE 1 DAY PRIOR TO YOUR TEST Rybelsus (semaglutide) Adlyxin (lixisenatide) Victoza (liraglutide) Byetta (exanatide) ___________________________________________________________________________  Patrick Floyd will receive your bowel preparation through [facility], which ensures the lowest copay and home delivery, with outreach via text or call from an [phone] number. Please respond promptly to avoid rescheduling of your procedure. If you are interested in alternative options or have any questions regarding your prep, please contact them at [phone] ____________________________________________________________________________  Your Provider Has Sent Your Bowel Prep Regimen To [facility]   [facility] will contact you to verify your information and collect your copay, if applicable. Enjoy the comfort of your home while your prescription is mailed to you, FREE of any shipping charges.   [facility] accepts all major insurance benefits and applies discounts & coupons.  Have additional questions?   Chat: [email or web address] Call: [phone] Email: [email or web address] NCPDP: [id number]  How will [facility] contact you?  With a Welcome phone call,  a Welcome text and a checkout link in text form.  Texts you receive from [phone] Are NOT Spam.  *To set up delivery, you must complete the checkout process via link or speak to one of the patient care representatives. If [facility] is unable to reach you, your prescription may be delayed.  To avoid long hold times on the phone, you may also utilize the secure chat  feature on the [facility] website to request that they call you back for transaction completion or to expedite your concerns.  Due to recent changes in healthcare laws, you may see the results of your imaging and laboratory studies on MyChart before your provider has had a chance to review them.  We understand that in some cases there may be results that are confusing or concerning to you. Not all laboratory results come back in the same time frame and the provider may be waiting for multiple results in order to interpret others.  Please give us  48 hours in order for your provider to thoroughly review all the results before contacting the office for clarification of your results.   _______________________________________________________  If your blood pressure at your visit was 140/90 or greater, please contact your primary care physician to follow up on this.  _______________________________________________________  If you are age 80 or older, your body mass index should be between 23-30. Your Body mass index is 27.55 kg/m. If this is out of the aforementioned range listed, please consider follow up with your Primary Care Provider.  If you are age 54 or younger, your body mass index should be between 19-25. Your Body mass index is 27.55 kg/m. If this is out of the aformentioned range listed, please consider follow up with your Primary Care Provider.   ________________________________________________________  The [facility] providers would like to encourage you to use MYCHART to communicate with providers for non-urgent requests or questions.  Due to long hold times on the telephone, sending your provider a message by [facility] may be a faster and more efficient way to get a response.  Please allow 48 business hours for a response.  Please remember that this is for non-urgent requests.  _______________________________________________________ Thank you for trusting me  with your gastrointestinal  care!   Deanna May, NP

## 2023-04-13 ENCOUNTER — Ambulatory Visit (INDEPENDENT_AMBULATORY_CARE_PROVIDER_SITE_OTHER): Payer: Medicare HMO | Admitting: Internal Medicine

## 2023-04-13 ENCOUNTER — Encounter: Payer: Self-pay | Admitting: Internal Medicine

## 2023-04-13 VITALS — BP 138/84 | HR 66 | Temp 97.8°F | Resp 16 | Ht 72.0 in | Wt 201.8 lb

## 2023-04-13 DIAGNOSIS — R748 Abnormal levels of other serum enzymes: Secondary | ICD-10-CM | POA: Diagnosis not present

## 2023-04-13 DIAGNOSIS — D72829 Elevated white blood cell count, unspecified: Secondary | ICD-10-CM | POA: Diagnosis not present

## 2023-04-13 DIAGNOSIS — E118 Type 2 diabetes mellitus with unspecified complications: Secondary | ICD-10-CM | POA: Diagnosis not present

## 2023-04-13 DIAGNOSIS — N400 Enlarged prostate without lower urinary tract symptoms: Secondary | ICD-10-CM

## 2023-04-13 DIAGNOSIS — R052 Subacute cough: Secondary | ICD-10-CM | POA: Insufficient documentation

## 2023-04-13 DIAGNOSIS — K5100 Ulcerative (chronic) pancolitis without complications: Secondary | ICD-10-CM

## 2023-04-13 DIAGNOSIS — R233 Spontaneous ecchymoses: Secondary | ICD-10-CM | POA: Insufficient documentation

## 2023-04-13 DIAGNOSIS — E785 Hyperlipidemia, unspecified: Secondary | ICD-10-CM | POA: Diagnosis not present

## 2023-04-13 LAB — URINALYSIS, ROUTINE W REFLEX MICROSCOPIC
Bilirubin Urine: NEGATIVE
Hgb urine dipstick: NEGATIVE
Ketones, ur: NEGATIVE
Leukocytes,Ua: NEGATIVE
Nitrite: NEGATIVE
RBC / HPF: NONE SEEN (ref 0–?)
Specific Gravity, Urine: 1.015 (ref 1.000–1.030)
Urine Glucose: NEGATIVE
Urobilinogen, UA: 0.2 (ref 0.0–1.0)
pH: 6.5 (ref 5.0–8.0)

## 2023-04-13 LAB — CBC WITH DIFFERENTIAL/PLATELET
Basophils Absolute: 0.1 10*3/uL (ref 0.0–0.1)
Basophils Relative: 0.7 % (ref 0.0–3.0)
Eosinophils Absolute: 0.2 10*3/uL (ref 0.0–0.7)
Eosinophils Relative: 1.6 % (ref 0.0–5.0)
HCT: 40.9 % (ref 39.0–52.0)
Hemoglobin: 13.5 g/dL (ref 13.0–17.0)
Lymphocytes Relative: 10.5 % — ABNORMAL LOW (ref 12.0–46.0)
Lymphs Abs: 1.2 10*3/uL (ref 0.7–4.0)
MCHC: 32.9 g/dL (ref 30.0–36.0)
MCV: 85.3 fL (ref 78.0–100.0)
Monocytes Absolute: 0.8 10*3/uL (ref 0.1–1.0)
Monocytes Relative: 7 % (ref 3.0–12.0)
Neutro Abs: 8.8 10*3/uL — ABNORMAL HIGH (ref 1.4–7.7)
Neutrophils Relative %: 80.2 % — ABNORMAL HIGH (ref 43.0–77.0)
Platelets: 278 10*3/uL (ref 150.0–400.0)
RBC: 4.79 Mil/uL (ref 4.22–5.81)
RDW: 14.4 % (ref 11.5–15.5)
WBC: 11 10*3/uL — ABNORMAL HIGH (ref 4.0–10.5)

## 2023-04-13 LAB — HEPATIC FUNCTION PANEL
ALT: 16 U/L (ref 0–53)
AST: 16 U/L (ref 0–37)
Albumin: 4.3 g/dL (ref 3.5–5.2)
Alkaline Phosphatase: 86 U/L (ref 39–117)
Bilirubin, Direct: 0.3 mg/dL (ref 0.0–0.3)
Total Bilirubin: 1.3 mg/dL — ABNORMAL HIGH (ref 0.2–1.2)
Total Protein: 7.3 g/dL (ref 6.0–8.3)

## 2023-04-13 LAB — TSH: TSH: 2.16 u[IU]/mL (ref 0.35–5.50)

## 2023-04-13 LAB — HEMOGLOBIN A1C: Hgb A1c MFr Bld: 6.6 % — ABNORMAL HIGH (ref 4.6–6.5)

## 2023-04-13 LAB — CK: Total CK: 126 U/L (ref 7–232)

## 2023-04-13 LAB — PSA: PSA: 1.66 ng/mL (ref 0.10–4.00)

## 2023-04-13 NOTE — Progress Notes (Unsigned)
Subjective:  Patient ID: Patrick Floyd, male    DOB: [birth year]  Age: 78 y.o. MRN: [id number]  CC: Hypertension and Diabetes   HPI Patrick Floyd presents for f/up ---  Discussed the use of AI scribe software for clinical note transcription with the patient, who gave verbal consent to proceed.  History of Present Illness   Patrick Floyd "Patrick Floyd" is a 78 year old male with hypertension and hypertrophic cardiomyopathy who presents for follow-up of his conditions.  A recent heart MRI showed no progression of his hypertrophic cardiomyopathy. His blood pressure is better controlled than during his last visit, where it was around 170 mmHg systolic. Currently, his blood pressure is generally around 136/84 mmHg. He monitors his blood pressure almost daily. He has been adjusting his blood pressure medications to achieve better control. He experiences occasional tiredness and balance issues, as well as lightheadedness, which he attributes to his medications. Amlodipine has caused some ankle swelling, but it is not as severe as before. No symptoms of headache or blurred vision.  He had blood in his stool twice about four weeks ago, which resolved after taking additional mesalamine. No abdominal pain, nausea, or vomiting.  He takes diclofenac almost daily for hip pain, which effectively manages his symptoms. Without diclofenac, he experiences pain in his shoulder and other joints. No muscle aches from his statin medication, which he takes nightly.  He gets up at night to urinate at least twice but denies any straining or difficulty urinating.  He recently experienced dull ear pain and itching after having his ear cleaned by an ENT specialist.  He reports a lack of muscle strength in his legs, particularly after not exercising for three weeks, although he typically exercises three times a week. No numbness or tingling in his legs.  He has a family history of factor XI deficiency, with his father  and daughter both affected. He has noticed easy bruising with minor trauma.         Outpatient Medications Prior to Visit  Medication Sig Dispense Refill   acetaminophen (TYLENOL) 500 MG tablet Take 500 mg by mouth every 6 (six) hours as needed.     amLODipine (NORVASC) 10 MG tablet TAKE 1 TABLET BY MOUTH EVERY DAY 90 tablet 2   diclofenac (VOLTAREN) 75 MG EC tablet Take 75 mg by mouth daily.     irbesartan (AVAPRO) 150 MG tablet Take 1 tablet (150 mg total) by mouth daily. 90 tablet 3   Mesalamine (ASACOL) 400 MG CPDR DR capsule Take 3 capsules (1,200 mg total) by mouth daily. 270 capsule 3   metFORMIN (GLUCOPHAGE-XR) 500 MG 24 hr tablet Take 1 tablet (500 mg total) by mouth daily with breakfast. 90 tablet 3   metoprolol succinate (TOPROL-XL) 50 MG 24 hr tablet Take 1/2 tablet (25 mg total) by mouth daily. Take with or immediately following a meal. 90 tablet 3   rosuvastatin (CRESTOR) 20 MG tablet Take 1 tablet (20 mg total) by mouth daily. 90 tablet 3   VITAMIN D PO Take by mouth daily.     No facility-administered medications prior to visit.    ROS Review of Systems  Constitutional:  Negative for appetite change, chills, diaphoresis, fatigue and fever.  HENT: Negative.  Negative for sore throat.   Eyes: Negative.   Respiratory:  Positive for cough. Negative for chest tightness, shortness of breath and wheezing.   Cardiovascular:  Negative for chest pain, palpitations and leg swelling.  Gastrointestinal: Negative.  Negative for abdominal pain, constipation, diarrhea, nausea and vomiting.  Genitourinary: Negative.  Negative for difficulty urinating.  Musculoskeletal:  Positive for arthralgias. Negative for gait problem, joint swelling and myalgias.  Skin: Negative.   Neurological:  Positive for weakness. Negative for dizziness, light-headedness, numbness and headaches.  Hematological:  Negative for adenopathy. Bruises/bleeds easily.  Psychiatric/Behavioral: Negative.       Objective:  BP 138/84 (BP Location: Left Arm, Patient Position: Sitting, Cuff Size: Normal)   Pulse 66   Temp 97.8 F (36.6 C) (Oral)   Resp 16   Ht 6' (1.829 m)   Wt 201 lb 12.8 oz (91.5 kg)   SpO2 98%   BMI 27.37 kg/m   BP Readings from Last 3 Encounters:  04/13/23 138/84  04/12/23 130/78  03/18/23 (!) 150/78    Wt Readings from Last 3 Encounters:  04/13/23 201 lb 12.8 oz (91.5 kg)  04/12/23 203 lb 2 oz (92.1 kg)  12/28/22 202 lb (91.6 kg)    Physical Exam Vitals reviewed.  Constitutional:      Appearance: Normal appearance.  HENT:     Right Ear: Hearing normal. No decreased hearing noted. No middle ear effusion. There is no impacted cerumen.     Left Ear: Hearing normal. No decreased hearing noted.  No middle ear effusion. There is no impacted cerumen.     Mouth/Throat:     Mouth: Mucous membranes are moist.  Eyes:     General: No scleral icterus.    Conjunctiva/sclera: Conjunctivae normal.  Cardiovascular:     Rate and Rhythm: Normal rate and regular rhythm.     Heart sounds: Murmur heard.     Systolic murmur is present with a grade of 1/6.     No friction rub. No gallop.     Comments: 1/6 SEM RUSB Pulmonary:     Effort: Pulmonary effort is normal.     Breath sounds: No stridor. No wheezing, rhonchi or rales.  Abdominal:     General: Abdomen is flat.     Palpations: There is no mass.     Tenderness: There is no abdominal tenderness. There is no guarding.     Hernia: No hernia is present.  Genitourinary:    Comments: GU/rectal deferred at his request Musculoskeletal:        General: Normal range of motion.     Cervical back: Neck supple.     Right lower leg: No edema.     Left lower leg: No edema.  Skin:    General: Skin is warm and dry.     Findings: Bruising present. No rash.  Neurological:     General: No focal deficit present.     Mental Status: He is alert. Mental status is at baseline.  Psychiatric:        Mood and Affect: Mood is  anxious. Mood is not depressed.        Behavior: Behavior normal.     Lab Results  Component Value Date   WBC 7.7 04/27/2022   HGB 14.0 04/27/2022   HCT 40.3 04/27/2022   PLT 287.0 04/27/2022   GLUCOSE 163 (H) 03/23/2023   CHOL 154 12/08/2022   TRIG 85 12/08/2022   HDL 50 12/08/2022   LDLDIRECT 166.7 08/24/2012   LDLCALC 88 12/08/2022   ALT 15 04/27/2022   AST 15 04/27/2022   NA 141 03/23/2023   K 4.5 03/23/2023   CL 99 03/23/2023   CREATININE 0.91 03/23/2023   BUN 27 03/23/2023  CO2 26 03/23/2023   TSH 2.88 04/27/2022   PSA 1.37 04/27/2022   HGBA1C 6.2 (A) 12/28/2022   MICROALBUR 11.8 (H) 12/23/2021    CT ANGIO CHEST AORTA W/CM & OR WO/CM Result Date: 11/29/2022 CLINICAL DATA:  Aortic aneurysm suspected. EXAM: CT ANGIOGRAPHY CHEST WITH CONTRAST TECHNIQUE: Multidetector CT imaging of the chest was performed using the standard protocol during bolus administration of intravenous contrast. Multiplanar CT image reconstructions and MIPs were obtained to evaluate the vascular anatomy. RADIATION DOSE REDUCTION: This exam was performed according to the departmental dose-optimization program which includes automated exposure control, adjustment of the mA and/or kV according to patient size and/or use of iterative reconstruction technique. CONTRAST:  OMNIPAQUE IOHEXOL 350 MG/ML SOLN COMPARISON:  None Available. FINDINGS: Cardiovascular: The heart is borderline enlarged and no pericardial effusion is seen. There is atherosclerotic calcification of the aorta with aneurysmal dilatation of the ascending aorta measuring 4 cm. No dissection is seen. The pulmonary trunk is normal in caliber. Mediastinum/Nodes: No enlarged mediastinal, hilar, or axillary lymph nodes. Thyroid gland, trachea, and esophagus demonstrate no significant findings. Lungs/Pleura: Mild atelectasis is present in the right lower lobe. No effusion or pneumothorax. Upper Abdomen: No acute abnormality. Musculoskeletal: Mild  degenerative changes are present in the thoracic spine. No acute osseous abnormality. Review of the MIP images confirms the above findings. IMPRESSION: Aortic atherosclerosis with mild aneurysmal dilatation of the ascending aorta measuring 4 cm. Recommend annual imaging followup by CTA or MRA. This recommendation follows 2010 ACCF/AHA/AATS/ACR/ASA/SCA/SCAI/SIR/STS/SVM Guidelines for the Diagnosis and Management of Patients with Thoracic Aortic Disease. Circulation. 2010; 121: Z610-R604. Aortic aneurysm NOS (ICD10-I71.9) Electronically Signed   By: Thornell Sartorius M.D.   On: 11/29/2022 00:36   DG Chest 2 View Result Date: 04/14/2023 CLINICAL DATA:  Cough EXAM: CHEST - 2 VIEW COMPARISON:  03/15/2017, 11/27/2022 FINDINGS: The heart size and mediastinal contours are within normal limits. Both lungs are clear. The visualized skeletal structures are unremarkable. IMPRESSION: No active cardiopulmonary disease. Electronically Signed   By: Duanne Guess D.O.   On: 04/14/2023 11:31     Assessment & Plan:  Hyperlipidemia with target LDL less than 100 -     TSH; Future -     Hepatic function panel; Future -     CK; Future  Ulcerative pancolitis without complication (HCC) -     CBC with Differential/Platelet; Future  Type II diabetes mellitus with manifestations (HCC) -     Urinalysis, Routine w reflex microscopic; Future -     Microalbumin / creatinine urine ratio; Future -     Hemoglobin A1c; Future  Alkaline phosphatase elevation -     Hepatic function panel; Future  Benign prostatic hyperplasia without lower urinary tract symptoms -     Urinalysis, Routine w reflex microscopic; Future -     PSA; Future  Easy bruising -     Factor 11 assay; Future     Follow-up: Return in about 3 months (around 07/11/2023).  Sanda Linger, MD

## 2023-04-13 NOTE — Patient Instructions (Signed)
Hypertension, Adult High blood pressure (hypertension) is when the force of blood pumping through the arteries is too strong. The arteries are the blood vessels that carry blood from the heart throughout the body. Hypertension forces the heart to work harder to pump blood and may cause arteries to become narrow or stiff. Untreated or uncontrolled hypertension can lead to a heart attack, heart failure, a stroke, kidney disease, and other problems. A blood pressure reading consists of a higher number over a lower number. Ideally, your blood pressure should be below 120/80. The first ("top") number is called the systolic pressure. It is a measure of the pressure in your arteries as your heart beats. The second ("bottom") number is called the diastolic pressure. It is a measure of the pressure in your arteries as the heart relaxes. What are the causes? The exact cause of this condition is not known. There are some conditions that result in high blood pressure. What increases the risk? Certain factors may make you more likely to develop high blood pressure. Some of these risk factors are under your control, including: Smoking. Not getting enough exercise or physical activity. Being overweight. Having too much fat, sugar, calories, or salt (sodium) in your diet. Drinking too much alcohol. Other risk factors include: Having a personal history of heart disease, diabetes, high cholesterol, or kidney disease. Stress. Having a family history of high blood pressure and high cholesterol. Having obstructive sleep apnea. Age. The risk increases with age. What are the signs or symptoms? High blood pressure may not cause symptoms. Very high blood pressure (hypertensive crisis) may cause: Headache. Fast or irregular heartbeats (palpitations). Shortness of breath. Nosebleed. Nausea and vomiting. Vision changes. Severe chest pain, dizziness, and seizures. How is this diagnosed? This condition is diagnosed by  measuring your blood pressure while you are seated, with your arm resting on a flat surface, your legs uncrossed, and your feet flat on the floor. The cuff of the blood pressure monitor will be placed directly against the skin of your upper arm at the level of your heart. Blood pressure should be measured at least twice using the same arm. Certain conditions can cause a difference in blood pressure between your right and left arms. If you have a high blood pressure reading during one visit or you have normal blood pressure with other risk factors, you may be asked to: Return on a different day to have your blood pressure checked again. Monitor your blood pressure at home for 1 week or longer. If you are diagnosed with hypertension, you may have other blood or imaging tests to help your health care provider understand your overall risk for other conditions. How is this treated? This condition is treated by making healthy lifestyle changes, such as eating healthy foods, exercising more, and reducing your alcohol intake. You may be referred for counseling on a healthy diet and physical activity. Your health care provider may prescribe medicine if lifestyle changes are not enough to get your blood pressure under control and if: Your systolic blood pressure is above 130. Your diastolic blood pressure is above 80. Your personal target blood pressure may vary depending on your medical conditions, your age, and other factors. Follow these instructions at home: Eating and drinking  Eat a diet that is high in fiber and potassium, and low in sodium, added sugar, and fat. An example of this eating plan is called the DASH diet. DASH stands for Dietary Approaches to Stop Hypertension. To eat this way: Eat  low-sodium diet, exercising more, and limiting alcohol. This information is not intended to replace advice given to you by your health care provider. Make sure you discuss any questions you have with your health care provider. Document Revised: 12/24/2020 Document Reviewed: 12/24/2020 Elsevier Patient Education  2024 [employer].

## 2023-04-14 ENCOUNTER — Ambulatory Visit (INDEPENDENT_AMBULATORY_CARE_PROVIDER_SITE_OTHER): Payer: Medicare HMO

## 2023-04-14 ENCOUNTER — Encounter: Payer: Self-pay | Admitting: Internal Medicine

## 2023-04-14 DIAGNOSIS — D72829 Elevated white blood cell count, unspecified: Secondary | ICD-10-CM | POA: Diagnosis not present

## 2023-04-14 DIAGNOSIS — R059 Cough, unspecified: Secondary | ICD-10-CM | POA: Diagnosis not present

## 2023-04-14 DIAGNOSIS — R052 Subacute cough: Secondary | ICD-10-CM

## 2023-04-15 LAB — MICROALBUMIN / CREATININE URINE RATIO
Creatinine,U: 97.3 mg/dL
Microalb Creat Ratio: 107.8 mg/g — ABNORMAL HIGH (ref 0.0–30.0)
Microalb, Ur: 10.5 mg/dL — ABNORMAL HIGH (ref 0.0–1.9)

## 2023-04-16 LAB — FACTOR 11 ASSAY: FACTOR XI ACTIVITY, CLOTTING: 86 % (ref 65–150)

## 2023-04-20 ENCOUNTER — Other Ambulatory Visit: Payer: Self-pay | Admitting: Internal Medicine

## 2023-04-27 ENCOUNTER — Encounter: Payer: Self-pay | Admitting: Pharmacist

## 2023-04-30 ENCOUNTER — Ambulatory Visit: Payer: Medicare HMO

## 2023-05-05 ENCOUNTER — Ambulatory Visit: Payer: Medicare HMO | Attending: Cardiology | Admitting: Pharmacist

## 2023-05-05 VITALS — BP 152/76 | HR 79

## 2023-05-05 DIAGNOSIS — I10 Essential (primary) hypertension: Secondary | ICD-10-CM

## 2023-05-05 MED ORDER — IRBESARTAN 300 MG PO TABS: 300.0000 mg | ORAL_TABLET | Freq: Every day | ORAL | 3 refills | Status: AC

## 2023-05-05 NOTE — Patient Instructions (Addendum)
 Please increase irbesartan to 300mg  daily Continue amlodipine 10 mg daily, metoprolol succinate 25mg  daily  Continue to monitor blood pressure at home  Please go for lab work in 2 weeks

## 2023-05-05 NOTE — Progress Notes (Signed)
 Patient ID: BRAELYNN Floyd                 DOB: 1945-06-16                      MRN: [id number]      HPI: Patrick Floyd is a 78 y.o. male referred by Dr. Anne Fu to HTN clinic. PMH is significant for hypertrophic cardiomyopathy, T2DM, ulcerative colitis, hyperlipidemia, hyperparathyroidism s/p parathyroidectomy. He has been undergoing a workup with endocrinology. Hyperaldosteronism was ruled out. Adrenal imaging 08/2022 revealed 15 mm right adrenal adenoma, 12 mm left adrenal adenoma. Waiting on results of 24hr urine collection.  Back in Feb patient was started on omlesartan and indapamide by PCP but patient stopped taking stating they made him tired. Olmesartan 10mg  daily was resumed, which was later increased to 20mg  daily. He was also started on rosuvastatin 10mg  which he reported doing well on. Renal function and K stable on ARB.   I saw patient is clinic 4/25. No changes were made at appointment, but after his labs resulted and I discussed with Dr. Lonzo Cloud, olmesartan was increased to 40mg  daily. He contacted me via mychart starting that he had a headache, felt a little dizzy with exercise and nauseous. We decided to wait a little longer to see if symptoms would go away.    I saw patient in clinic 5/16. Blood pressure was 128/68. No medication changes were made. Patient contacted me via mychart 6/25 requesting to decrease omlesartan due to tiredness, nausea dizziness and weakness that he contributes to olmesartan.   Seen 7/16, patient refused medication changes. Increased physical activity/intensity was encouraged. On 8/27 metoprolol was changed to carvedilol.  Olmesartan and carvedilol stopped at appointment with Dr. Anne Fu 10/8.  Nebivolol 10 mg daily started.  At last visit with PharmD, patient had stopped nebivolol (felt like it was affecting his balance and leg strength) and had gone back to metoprolol. I stopped his metoprolol thinking maybe it was contributing to his fatigue.  Irbesartan 75mg  was started. Via mychart, it was increased to 150mg  daily.   Patient messaged me stating he had an episode where he had some palpitations and overall did not feel well after cleaning out a closet.  He sat down and felt better after about 20 minutes.  Also reported some palpitations in the evening. At his appointment 03/18/23, a few days later, metoprolol was resumed and his irbesartan was increased back to 150mg  daily.   Patient presents today for follow-up.  Reports feeling well.  Thinks his symptoms have improved with resumption of metoprolol.  We discussed his lab work his PCP did and his microalbuminuria.  Discussed a few medications his primary care provider could be considering for his CKD including SGLT2 or Karindia  SGLT2 would probably be a better option.  We talked about ARB's role in slowing the progression of albuminuria as well.  He wishes to also discuss with endocrinology. Home blood pressure averaging about 141/78.  Heart rate mainly mid 60s.   Home blood pressure cuff previously found to be accurate. Home wrist cuff:146/69 HR 66, Clinic cuff 148/68  Current mediations: amlodipine 10 mg daily (AM), irbesartan 150mg  daily, metoprolol succinate 25mg  daily Previously tried mediations: olmesartan 40 mg, indapamide (didn't react well), metoprolol, carvedilol, nebivolol (leg weakness). Hydrochlorothiazide, indapamide (didn't like the way it made him feel) BP goal: <130/80   Family History:  Mother- heart attack  Father - HTN, heart attack  Siblings- colitis, cardiomyopathy  Social History:  Alcohol: occasional 1-2 drinks per week Smoking: quit 42 years ago   Diet: eats home cooked meals most days of the week eats out once or twice week can reduce that to every other day. Limits salt intake    Exercise: 30-45 minutes on bike or elliptical 3x/week, has been doing some strength training   Home BP readings:  148 78 67  142 78 62  145 80 63  136 78 65  152 79  73  145 80 59  130 78 65  138 84 66  146  73 65  139 77 66  134 72 64  139 80 73  141.1667 16.10960 45.40981     Lab Results  Component Value Date   CHOL 154 12/08/2022   HDL 50 12/08/2022   LDLCALC 88 12/08/2022   LDLDIRECT 166.7 08/24/2012   TRIG 85 12/08/2022   CHOLHDL 3.1 12/08/2022     Wt Readings from Last 3 Encounters:  04/13/23 201 lb 12.8 oz (91.5 kg)  04/12/23 203 lb 2 oz (92.1 kg)  12/28/22 202 lb (91.6 kg)   BP Readings from Last 3 Encounters:  04/13/23 138/84  04/12/23 130/78  03/18/23 (!) 150/78   Pulse Readings from Last 3 Encounters:  04/13/23 66  04/12/23 75  03/18/23 80    Renal function: CrCl cannot be calculated (Patient's most recent lab result is older than the maximum 21 days allowed.).  Past Medical History:  Diagnosis Date   Diverticulosis of colon    GERD (gastroesophageal reflux disease)    watches diet   Hemorrhoids, internal    History of adenomatous polyp of colon    History of anal fissures    History of basal cell carcinoma    History of COVID-19 01/2021   per pt mild symptoms that resolved   History of primary hyperparathyroidism 01/2016   endocrinologist--- dr Lonzo Cloud;  s/p right superior parathyroidectomy 06/ 2018,  adenoma   Hyperlipidemia    Hypertension    Hypertrophic cardiomyopathy [facility])    cardiologist--- dr Anne Fu   IBS (irritable bowel syndrome)    Inguinal hernia, bilateral    Nocturia    PONV (postoperative nausea and vomiting)    RBBB (right bundle branch block with left anterior fascicular block)    Seasonal allergies    Type 2 diabetes mellitus (HCC)    followed by dr Lonzo Cloud (endocrinologist);;  (06-25-2021 pt stated does not check blood sugar at home)   Ulcerative colitis, left sided (HCC)    followed by dr stark (GI)   Umbilical hernia    Wears glasses     Current Outpatient Medications on File Prior to Visit  Medication Sig Dispense Refill   acetaminophen (TYLENOL) 500 MG tablet Take  500 mg by mouth every 6 (six) hours as needed.     amLODipine (NORVASC) 10 MG tablet TAKE 1 TABLET BY MOUTH EVERY DAY 90 tablet 2   diclofenac (VOLTAREN) 75 MG EC tablet Take 75 mg by mouth daily.     irbesartan (AVAPRO) 150 MG tablet Take 1 tablet (150 mg total) by mouth daily. 90 tablet 3   Mesalamine (ASACOL) 400 MG CPDR DR capsule Take 3 capsules (1,200 mg total) by mouth daily. 270 capsule 3   metFORMIN (GLUCOPHAGE-XR) 500 MG 24 hr tablet Take 1 tablet (500 mg total) by mouth daily with breakfast. 90 tablet 3   metoprolol succinate (TOPROL-XL) 50 MG 24 hr tablet Take 1/2 tablet (25 mg total) by mouth daily. Take  with or immediately following a meal. 90 tablet 3   rosuvastatin (CRESTOR) 20 MG tablet Take 1 tablet (20 mg total) by mouth daily. 90 tablet 3   VITAMIN D PO Take by mouth daily.     No current facility-administered medications on file prior to visit.    No Known Allergies  There were no vitals taken for this visit.  Assessment/Plan:  1. Hypertension -  Assessment: Blood pressure improved but above goal of less than 130/80 Feeling better now that he is back on metoprolol Home blood pressures averaging around 140/78 Has been exercising with plans to increase his frequency  Plan: Increase irbesartan to 300 mg daily Repeat lab work in 2 weeks Follow-up in office in about 4 weeks Continue to monitor blood pressure at home Increase frequency of exercise   Olene Floss, Pharm.D, BCACP, CPP [facility] [address], [place], [place] [address]  Phone: [phone]; Fax: [phone]

## 2023-05-12 ENCOUNTER — Other Ambulatory Visit: Payer: Self-pay | Admitting: Internal Medicine

## 2023-05-12 ENCOUNTER — Telehealth: Payer: Self-pay

## 2023-05-12 DIAGNOSIS — E118 Type 2 diabetes mellitus with unspecified complications: Secondary | ICD-10-CM

## 2023-05-12 NOTE — Telephone Encounter (Unsigned)
 Copied from CRM [id number]. Topic: Clinical - Medical Advice >> May 11, 2023  5:23 PM Sonny Dandy B wrote: Reason for CRM: pt called to speak with provider states he receive a message stating the lab results were calculated incorrectly. Pt would like to know if he should ttake the labs again prior to taking the medication that was prescribed to his by the provider. The message is in my chart the patient wants clarity.

## 2023-05-12 NOTE — Telephone Encounter (Signed)
 Ask him to come back to the lab for another urine test

## 2023-05-12 NOTE — Telephone Encounter (Signed)
 Please advise. This is about that automatic message that went out this week.

## 2023-05-12 NOTE — Telephone Encounter (Signed)
 Unable to reach patient. Left a very detailed message in regards to the patient coming in the office to have his labs redrawn.

## 2023-05-18 ENCOUNTER — Encounter: Payer: Self-pay | Admitting: Internal Medicine

## 2023-05-25 NOTE — Progress Notes (Unsigned)
  Gastroenterology History and Physical   Primary Care Physician:  Etta Grandchild, MD   Reason for Procedure:  Ulcerative colitis, left-sided  Plan:    Colonoscopy     HPI: Patrick NOTEBOOM is a 78 y.o. male with a history of left-sided ulcerative colitis seen in the clinic in February by  advanced practitioner Deanna May, NP and colonoscopy was scheduled.   Left sided ulcerative colitis without complication (HCC) Yes   Rectal bleeding        78 year old male patient with history of left sided Ulcerative colitis who presents for surveillance colon screening, will schedule today. He does endorse two separate episodes of blood noted on stool and toilet paper 4-5 weeks ago. Hx of internal hemorrhoids. Denies altered bowel habits or straining. He would like to wait for colonoscopy results to discuss with Dr. Leone Payor options pending impression.      Past Medical History:  Diagnosis Date   Diverticulosis of colon    GERD (gastroesophageal reflux disease)    watches diet   Hemorrhoids, internal    History of adenomatous polyp of colon    History of anal fissures    History of basal cell carcinoma    History of COVID-19 01/2021   per pt mild symptoms that resolved   History of primary hyperparathyroidism 01/2016   endocrinologist--- dr Lonzo Cloud;  s/p right superior parathyroidectomy 06/ 2018,  adenoma   Hyperlipidemia    Hypertension    Hypertrophic cardiomyopathy [facility])    cardiologist--- dr Anne Fu   IBS (irritable bowel syndrome)    Inguinal hernia, bilateral    Nocturia    PONV (postoperative nausea and vomiting)    RBBB (right bundle branch block with left anterior fascicular block)    Seasonal allergies    Type 2 diabetes mellitus (HCC)    followed by dr Lonzo Cloud (endocrinologist);;  (06-25-2021 pt stated does not check blood sugar at home)   Ulcerative colitis, left sided [facility])    followed by dr stark (GI)   Umbilical hernia    Wears glasses     Past  Surgical History:  Procedure Laterality Date   COLONOSCOPY WITH PROPOFOL  05/27/2020   by dr stark   INGUINAL HERNIA REPAIR Bilateral 06/27/2021   Procedure: LAPAROSCOPIC BILATERAL INGUINAL HERNIA REPAIR;  Surgeon: Karie Soda, MD;  Location: [facility];  Service: General;  Laterality: Bilateral;   MASS EXCISION Left 02/12/2017   Procedure: EXCISION 6x6 CM BACK MASS;  Surgeon: Luretha Murphy, MD;  Location: [facility];  Service: General;  Laterality: Left;   PARATHYROIDECTOMY Right 08/20/2016   @[facility] ;  right superior   (adenoma)   UMBILICAL HERNIA REPAIR  06/27/2021   Procedure: PRIMARY UMBILICAL HERNIA REPAIR;  Surgeon: Karie Soda, MD;  Location: [facility];  Service: General;;    Prior to Admission medications   Medication Sig Start Date End Date Taking? Authorizing Provider  acetaminophen (TYLENOL) 500 MG tablet Take 500 mg by mouth every 6 (six) hours as needed.    [provider]  amLODipine (NORVASC) 10 MG tablet TAKE 1 TABLET BY MOUTH EVERY DAY 03/05/23   Jake Bathe, MD  diclofenac (VOLTAREN) 75 MG EC tablet Take 75 mg by mouth daily.    [provider]  irbesartan (AVAPRO) 300 MG tablet Take 1 tablet (300 mg total) by mouth daily. 05/05/23   Jake Bathe, MD  Mesalamine (ASACOL) 400 MG CPDR DR capsule Take 3 capsules (1,200 mg total) by  mouth daily. 04/12/23   May, Deanna J, NP  metFORMIN (GLUCOPHAGE-XR) 500 MG 24 hr tablet Take 1 tablet (500 mg total) by mouth daily with breakfast. 12/28/22   Shamleffer, Konrad Dolores, MD  metoprolol succinate (TOPROL-XL) 50 MG 24 hr tablet Take 1/2 tablet (25 mg total) by mouth daily. Take with or immediately following a meal. 03/18/23   Maccia, Melissa D, RPH-CPP  rosuvastatin (CRESTOR) 20 MG tablet Take 1 tablet (20 mg total) by mouth daily. 05/19/22   Jake Bathe, MD  VITAMIN D PO Take by mouth daily.    [provider]    Current Outpatient Medications  Medication Sig Dispense  Refill   acetaminophen (TYLENOL) 500 MG tablet Take 500 mg by mouth every 6 (six) hours as needed.     amLODipine (NORVASC) 10 MG tablet TAKE 1 TABLET BY MOUTH EVERY DAY 90 tablet 2   diclofenac (VOLTAREN) 75 MG EC tablet Take 75 mg by mouth daily.     irbesartan (AVAPRO) 300 MG tablet Take 1 tablet (300 mg total) by mouth daily. 90 tablet 3   Mesalamine (ASACOL) 400 MG CPDR DR capsule Take 3 capsules (1,200 mg total) by mouth daily. 270 capsule 3   metFORMIN (GLUCOPHAGE-XR) 500 MG 24 hr tablet Take 1 tablet (500 mg total) by mouth daily with breakfast. 90 tablet 3   metoprolol succinate (TOPROL-XL) 50 MG 24 hr tablet Take 1/2 tablet (25 mg total) by mouth daily. Take with or immediately following a meal. 90 tablet 3   rosuvastatin (CRESTOR) 20 MG tablet Take 1 tablet (20 mg total) by mouth daily. 90 tablet 3   VITAMIN D PO Take by mouth daily.     No current facility-administered medications for this visit.    Allergies as of 05/26/2023   (No Known Allergies)    Family History  Problem Relation Age of Onset   Hypertension Other    Breast cancer Mother    Heart disease Father    Hypertrophic cardiomyopathy Sister    Colon cancer Neg Hx    Stomach cancer Neg Hx    Cancer Neg Hx        Colon and Prostate   Hyperparathyroidism Neg Hx    Colon polyps Neg Hx    Esophageal cancer Neg Hx    Rectal cancer Neg Hx     Social History   Socioeconomic History   Marital status: Married    Spouse name: Bonita Quin   Number of children: Not on file   Years of education: college   Highest education level: Bachelor's degree (e.g., BA, AB, BS)  Occupational History   Not on file  Tobacco Use   Smoking status: Former    Current packs/day: 0.00    Types: Cigarettes    Start date: 12/05/1974    Quit date: 12/05/1979    Years since quitting: 43.4   Smokeless tobacco: Never  Vaping Use   Vaping status: Never Used  Substance and Sexual Activity   Alcohol use: Not Currently    Comment: rare    Drug use: Never   Sexual activity: Not on file  Other Topics Concern   Not on file  Social History Narrative   Engineer, maintenance (IT), married with 2 daughters - 1 finished college in Engineer, mining Studies, 1 in college (7/10). 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 Marriage is in good health. Work is usual amount of stress.    Social Drivers of Health  Financial Resource Strain: Low Risk  (04/12/2023)   Overall Financial Resource Strain (CARDIA)    Difficulty of Paying Living Expenses: Not hard at all  Food Insecurity: No Food Insecurity (04/12/2023)   Hunger Vital Sign    Worried About Running Out of Food in the Last Year: Never true    Ran Out of Food in the Last Year: Never true  Transportation Needs: No Transportation Needs (04/12/2023)   PRAPARE - Administrator, Civil Service (Medical): No    Lack of Transportation (Non-Medical): No  Physical Activity: Sufficiently Active (04/12/2023)   Exercise Vital Sign    Days of Exercise per Week: 3 days    Minutes of Exercise per Session: 60 min  Stress: Stress Concern Present (04/12/2023)   [employer] of Occupational Health - Occupational Stress Questionnaire    Feeling of Stress : To some extent  Social Connections: Unknown (04/12/2023)   Social Connection and Isolation Panel [NHANES]    Frequency of Communication with Friends and Family: Twice a week    Frequency of Social Gatherings with Friends and Family: Three times a week    Attends Religious Services: Patient declined    Active Member of Clubs or Organizations: No    Attends Banker Meetings: 1 to 4 times per year    Marital Status: Married  Catering manager Violence: Not At Risk (01/26/2022)   Humiliation, Afraid, Rape, and Kick questionnaire    Fear of Current or Ex-Partner: No    Emotionally Abused: No    Physically Abused: No    Sexually Abused: No    Review of Systems: Positive for *** All  other review of systems negative except as mentioned in the HPI.  Physical Exam: Vital signs There were no vitals taken for this visit.  General:   Alert,  Well-developed, well-nourished, pleasant and cooperative in NAD Lungs:  Clear throughout to auscultation.   Heart:  Regular rate and rhythm; no murmurs, clicks, rubs,  or gallops. Abdomen:  Soft, nontender and nondistended. Normal bowel sounds.   Neuro/Psych:  Alert and cooperative. Normal mood and affect. A and O x 3   @Charletha Dalpe  Sena Slate, MD, [facility] [phone] (pager) 05/25/2023 4:54 PM@

## 2023-05-26 ENCOUNTER — Encounter: Payer: Self-pay | Admitting: Internal Medicine

## 2023-05-26 ENCOUNTER — Ambulatory Visit: Payer: Medicare HMO | Admitting: Internal Medicine

## 2023-05-26 VITALS — BP 145/73 | HR 71 | Temp 98.8°F | Resp 15 | Ht 72.0 in | Wt 203.0 lb

## 2023-05-26 DIAGNOSIS — D128 Benign neoplasm of rectum: Secondary | ICD-10-CM

## 2023-05-26 DIAGNOSIS — K648 Other hemorrhoids: Secondary | ICD-10-CM | POA: Diagnosis not present

## 2023-05-26 DIAGNOSIS — K51511 Left sided colitis with rectal bleeding: Secondary | ICD-10-CM | POA: Diagnosis not present

## 2023-05-26 DIAGNOSIS — K621 Rectal polyp: Secondary | ICD-10-CM | POA: Diagnosis not present

## 2023-05-26 DIAGNOSIS — K5150 Left sided colitis without complications: Secondary | ICD-10-CM

## 2023-05-26 DIAGNOSIS — Z1211 Encounter for screening for malignant neoplasm of colon: Secondary | ICD-10-CM | POA: Diagnosis not present

## 2023-05-26 DIAGNOSIS — K625 Hemorrhage of anus and rectum: Secondary | ICD-10-CM | POA: Diagnosis not present

## 2023-05-26 DIAGNOSIS — K5140 Inflammatory polyps of colon without complications: Secondary | ICD-10-CM | POA: Diagnosis not present

## 2023-05-26 DIAGNOSIS — I10 Essential (primary) hypertension: Secondary | ICD-10-CM | POA: Diagnosis not present

## 2023-05-26 DIAGNOSIS — E119 Type 2 diabetes mellitus without complications: Secondary | ICD-10-CM | POA: Diagnosis not present

## 2023-05-26 DIAGNOSIS — K5730 Diverticulosis of large intestine without perforation or abscess without bleeding: Secondary | ICD-10-CM

## 2023-05-26 DIAGNOSIS — K644 Residual hemorrhoidal skin tags: Secondary | ICD-10-CM | POA: Diagnosis not present

## 2023-05-26 MED ORDER — SODIUM CHLORIDE 0.9 % IV SOLN: 500.0000 mL | Freq: Once | INTRAVENOUS | Status: DC

## 2023-05-26 NOTE — Op Note (Addendum)
 [facility] Patient Name: Patrick Floyd Procedure Date: 05/26/2023 9:01 AM MRN: [id number] Endoscopist: Iva Boop , MD, [id number] Age: 78 Referring MD:  Date of Birth: 1946/01/24 Gender: Male Account #: [id number] Procedure:                Colonoscopy Indications:              High risk colon cancer surveillance: Ulcerative                            left sided colitis of 8 (or more) years duration                            also hx adenomatous polyps in past - last                            colonoscopy 2022 Medicines:                Monitored Anesthesia Care Procedure:                Pre-Anesthesia Assessment:                           - Prior to the procedure, a History and Physical                            was performed, and patient medications and                            allergies were reviewed. The patient's tolerance of                            previous anesthesia was also reviewed. The risks                            and benefits of the procedure and the sedation                            options and risks were discussed with the patient.                            All questions were answered, and informed consent                            was obtained. Prior Anticoagulants: The patient has                            taken no anticoagulant or antiplatelet agents. ASA                            Grade Assessment: II - A patient with mild systemic                            disease. After reviewing the risks and benefits,  the patient was deemed in satisfactory condition to                            undergo the procedure.                           After obtaining informed consent, the colonoscope                            was passed under direct vision. Throughout the                            procedure, the patient's blood pressure, pulse, and                            oxygen saturations were monitored continuously. The                             Olympus CF-HQ190L ([id number]) Colonoscope was                            introduced through the anus and advanced to the the                            cecum, identified by appendiceal orifice and                            ileocecal valve. The colonoscopy was performed                            without difficulty. The patient tolerated the                            procedure well. The quality of the bowel                            preparation was adequate. The ileocecal valve,                            appendiceal orifice, and rectum were photographed.                            The bowel preparation used was SUFLAVE via split                            dose instruction. Scope In: 9:21:39 AM Scope Out: 9:45:38 AM Scope Withdrawal Time: 0 hours 15 minutes 43 seconds  Total Procedure Duration: 0 hours 23 minutes 59 seconds  Findings:                 The perianal and digital rectal examinations were                            normal.  An 8 mm polyp was found in the distal rectum. The                            polyp was pedunculated. The polyp was removed with                            a hot snare. Resection and retrieval were complete.                            Verification of patient identification for the                            specimen was done. Estimated blood loss: none.                           Multiple large-mouthed diverticula were found in                            the sigmoid colon and descending colon. There was                            narrowing of the colon in association with the                            diverticular opening.                           External and internal hemorrhoids were found.                           The exam was otherwise without abnormality on                            direct and retroflexion views.                           Biopsies were taken with a cold forceps in the                             entire colon for histology. Verification of patient                            identification for the specimen was done. Estimated                            blood loss was minimal. Complications:            No immediate complications. Estimated Blood Loss:     Estimated blood loss was minimal. Impression:               - One 8 mm polyp in the distal rectum, removed with                            a hot snare. Resected and retrieved.                           -  Severe diverticulosis in the sigmoid colon and in                            the descending colon. There was narrowing of the                            colon in association with the diverticular opening.                           - External and internal hemorrhoids.                           - The examination was otherwise normal on direct                            and retroflexion views.                           - Biopsies were taken with a cold forceps for                            histology in the entire colon. (cecum, ascending                            and transverse jar 1 and descening sigmoid and                            rectum jar 2) Recommendation:           - Patient has a contact number available for                            emergencies. The signs and symptoms of potential                            delayed complications were discussed with the                            patient. Return to normal activities tomorrow.                            Written discharge instructions were provided to the                            patient.                           - Resume previous diet.                           - Continue present medications. He is on mesalamine                            1.2 g daily (3 x 400mg )                           -  Await pathology results.                           - No recommendation at this time regarding repeat                            colonoscopy due to age. discussed in  recovery -                            will anticipate a recall to discuss another                            colonoscopy likely in 3 years                           - No aspirin, ibuprofen, naproxen, or other                            non-steroidal anti-inflammatory drugs for 2 weeks                            after polyp removal. Iva Boop, MD 05/26/2023 9:56:38 AM This report has been signed electronically.

## 2023-05-26 NOTE — Progress Notes (Signed)
 Called to room to assist during endoscopic procedure.  Patient ID and intended procedure confirmed with present staff. Received instructions for my participation in the procedure from the performing physician.

## 2023-05-26 NOTE — Patient Instructions (Addendum)
 No signs of active colitis. - biopsies taken.  There was one rectal polyp removed.  I will let you know results and recommendations.  I appreciate the opportunity to care for you. Iva Boop, MD, [facility]   Continue previous diet & medications  Handouts on polyps,diverticulosis,& hemorrhoids given to you today.   Await pathology results on polyps removed   NO ASPIRIN, ASPIRIN CONTAINING PRODUCTS (BC OR GOODY POWDERS) OR NSAIDS (IBUPROFEN, ADVIL, ALEVE, AND MOTRIN) FOR 2 weeks after polyp removal .TYLENOL IS OK TO TAKE    YOU HAD AN ENDOSCOPIC PROCEDURE TODAY AT THE [facility]:   Refer to the procedure report that was given to you for any specific questions about what was found during the examination.  If the procedure report does not answer your questions, please call your gastroenterologist to clarify.  If you requested that your care partner not be given the details of your procedure findings, then the procedure report has been included in a sealed envelope for you to review at your convenience later.  YOU SHOULD EXPECT: Some feelings of bloating in the abdomen. Passage of more gas than usual.  Walking can help get rid of the air that was put into your GI tract during the procedure and reduce the bloating. If you had a lower endoscopy (such as a colonoscopy or flexible sigmoidoscopy) you may notice spotting of blood in your stool or on the toilet paper. If you underwent a bowel prep for your procedure, you may not have a normal bowel movement for a few days.  Please Note:  You might notice some irritation and congestion in your nose or some drainage.  This is from the oxygen used during your procedure.  There is no need for concern and it should clear up in a day or so.  SYMPTOMS TO REPORT IMMEDIATELY:  Following lower endoscopy (colonoscopy or flexible sigmoidoscopy):  Excessive amounts of blood in the stool  Significant tenderness or worsening of abdominal  pains  Swelling of the abdomen that is new, acute  Fever of 100F or higher   For urgent or emergent issues, a gastroenterologist can be reached at any hour by calling [phone]. Do not use MyChart messaging for urgent concerns.    DIET:  We do recommend a small meal at first, but then you may proceed to your regular diet.  Drink plenty of fluids but you should avoid alcoholic beverages for 24 hours.  ACTIVITY:  You should plan to take it easy for the rest of today and you should NOT DRIVE or use heavy machinery until tomorrow (because of the sedation medicines used during the test).    FOLLOW UP: Our staff will call the number listed on your records the next business day following your procedure.  We will call around 7:15- 8:00 am to check on you and address any questions or concerns that you may have regarding the information given to you following your procedure. If we do not reach you, we will leave a message.     If any biopsies were taken you will be contacted by phone or by letter within the next 1-3 weeks.  Please call us at [phone] if you have not heard about the biopsies in 3 weeks.    SIGNATURES/CONFIDENTIALITY: You and/or your care partner have signed paperwork which will be entered into your electronic medical record.  These signatures attest to the fact that that the information above on your After Visit Summary has  been reviewed and is understood.  Full responsibility of the confidentiality of this discharge information lies with you and/or your care-partner.

## 2023-05-26 NOTE — Progress Notes (Signed)
 Pt's states no medical or surgical changes since previsit or office visit.

## 2023-05-26 NOTE — Progress Notes (Signed)
 Report to PACU, RN, vss, BBS= Clear.

## 2023-05-27 ENCOUNTER — Telehealth: Payer: Self-pay

## 2023-05-27 NOTE — Telephone Encounter (Signed)
 Left message on follow up call.

## 2023-05-28 LAB — SURGICAL PATHOLOGY

## 2023-05-29 ENCOUNTER — Encounter: Payer: Self-pay | Admitting: Internal Medicine

## 2023-06-04 DIAGNOSIS — I10 Essential (primary) hypertension: Secondary | ICD-10-CM | POA: Diagnosis not present

## 2023-06-05 LAB — BASIC METABOLIC PANEL WITH GFR
BUN/Creatinine Ratio: 25 — ABNORMAL HIGH (ref 10–24)
BUN: 21 mg/dL (ref 8–27)
CO2: 24 mmol/L (ref 20–29)
Calcium: 9.3 mg/dL (ref 8.6–10.2)
Chloride: 99 mmol/L (ref 96–106)
Creatinine, Ser: 0.85 mg/dL (ref 0.76–1.27)
Glucose: 89 mg/dL (ref 70–99)
Potassium: 4.1 mmol/L (ref 3.5–5.2)
Sodium: 139 mmol/L (ref 134–144)
eGFR: 91 mL/min/{1.73_m2} (ref >59)

## 2023-06-07 ENCOUNTER — Encounter: Payer: Self-pay | Admitting: Pharmacist

## 2023-06-11 ENCOUNTER — Ambulatory Visit: Payer: Medicare HMO | Attending: Cardiology | Admitting: Pharmacist

## 2023-06-11 VITALS — BP 146/74 | HR 68

## 2023-06-11 DIAGNOSIS — I10 Essential (primary) hypertension: Secondary | ICD-10-CM

## 2023-06-11 MED ORDER — SPIRONOLACTONE 25 MG PO TABS
12.5000 mg | ORAL_TABLET | Freq: Every day | ORAL | 3 refills | Status: DC
Start: 2023-06-11 — End: 2023-08-12

## 2023-06-11 NOTE — Progress Notes (Signed)
 Patient ID: Patrick Floyd                 DOB: 22-Mar-1945                      MRN: [id number]      HPI: Patrick Floyd is a 78 y.o. male referred by Dr. Anne Fu to HTN clinic. PMH is significant for hypertrophic cardiomyopathy, T2DM, ulcerative colitis, hyperlipidemia, hyperparathyroidism s/p parathyroidectomy. Hyperaldosteronism was ruled out. Adrenal imaging 08/2022 revealed 15 mm right adrenal adenoma, 12 mm left adrenal adenoma.  Back in Feb 2024, patient was started on omlesartan and indapamide by PCP but patient stopped taking stating they made him tired. Olmesartan 10mg  daily was resumed, which was later increased to 20mg  daily. He was also started on rosuvastatin 10mg  which he reported doing well on. Renal function and K stable on ARB.   Seen 4/25, no changes were made at appointment, but after his labs resulted and I discussed with Dr. Lonzo Cloud, olmesartan was increased to 40mg  daily. He contacted me via mychart starting that he had a headache, felt a little dizzy with exercise and nauseous. We decided to wait a little longer to see if symptoms would go away.    Seen 5/16, blood pressure was 128/68. No medication changes were made. Patient contacted me via mychart 6/25 requesting to decrease omlesartan due to tiredness, nausea dizziness and weakness that he contributes to olmesartan.   Seen 7/16, patient refused medication changes. Increased physical activity/intensity was encouraged. On 8/27 metoprolol was changed to carvedilol.  Olmesartan and carvedilol stopped at appointment with Dr. Anne Fu 10/8. Nebivolol 10 mg daily started.  Seen 03/18/23, patient had stopped nebivolol (felt like it was affecting his balance and leg strength) and had gone back to metoprolol. I stopped his metoprolol thinking maybe it was contributing to his fatigue. Irbesartan 75mg  was started. Via mychart, it was increased to 150mg  daily.   Patient messaged me stating he had an episode where he had some  palpitations and overall did not feel well after cleaning out a closet.  He sat down and felt better after about 20 minutes.  Also reported some palpitations in the evening. At his appointment 03/18/23, a few days later, metoprolol was resumed and his irbesartan was increased back to 150mg  daily.   Last seen 05/05/23 in clinic. Reported feeling well. He believed his symptoms have improved with resumption of metoprolol. Discussed his lab work his PCP did and his microalbuminuria, UACR 107.8. Discussed a few medications his PCP could be considering for his CKD including SGLT2 or [place]. Home BP averaging about 141/78. Heart rate mainly mid 60s. Increased irbesartan to 300 mg daily.   4/4 BMP WNL, appears a little dehydrated with BUN/Cr > 20. Patient message on 4/7 indicated BP mainly in low 130s-low 140s/mid 70s-80 which is an improvement from previous.  At today's clinic visit, patient reports he has been off his diclofenac for three weeks in preparation for a colonoscopy after removal of a polyp. As a result, he has been in significant pain that has precluded him from his usual exercise regimen. Pain is better today now that diclofenac was resumed. Has been exercising 1-2x weekly instead of >3x weekly. Eating more sweets. Still no salt in diet. He reports no symptoms of hypotension (dizziness, lightheadedness) or hypertension (headache, blurred vision). He is concerned, however, that the switch to irbesartan 300 mg has worsened his BP. More SBP in 130s-low 140s before titration, now systolic  readings are consistently in 140s-150s, average home BP 146/79. BP in clinic today 142/74 and 146/74, HR 68. We discussed it is possible to not see a beneficial difference between 150 and 300 mg of irbesartan, but increases are likely attributable to increased pain/stress and reduced physical activity.   Patient is discouraged that he is on multiple medications and that they are not working.  We talked about how  metoprolol does not provide much BP lowering effect and his other medications are maxed out, therefore our next step is to add spironolactone for additional BP lowering, patient agreeable to try in addition to getting back on track with exercise regimen. Discussed mechanism of action and side effects of spironolactone.    Current mediations: amlodipine 10 mg daily (AM), irbesartan 300 mg daily, metoprolol succinate 25mg  daily Previously tried mediations: olmesartan 40 mg, indapamide (didn't react well), metoprolol, carvedilol, nebivolol (leg weakness). Hydrochlorothiazide, indapamide (didn't like the way it made him feel) BP goal: <130/80   Family History:  Mother- heart attack  Father - HTN, heart attack  Siblings- colitis, cardiomyopathy    Social History:  Alcohol: occasional 1-2 drinks per week Smoking: quit 42 years ago   Diet: eats home cooked meals most days of the week eats out once or twice week can reduce that to every other day. Limits salt intake  Exercise: 30-45 minutes on bike or elliptical 3x/week, has been doing some strength training  Home BP readings:  Date SBP/DBP  HR  4/3 146/89   4/4 145/77   4/5 140/77   4/6 153/84   4/7 146/77   4/8 152/83   4/9 140/74   4/10 139/77   4/11 149/76   Average 146/79 60s     Wt Readings from Last 3 Encounters:  05/26/23 203 lb (92.1 kg)  04/13/23 201 lb 12.8 oz (91.5 kg)  04/12/23 203 lb 2 oz (92.1 kg)   BP Readings from Last 3 Encounters:  06/11/23 (!) 146/74  05/26/23 (!) 145/73  05/05/23 (!) 152/76   Pulse Readings from Last 3 Encounters:  06/11/23 68  05/26/23 71  05/05/23 79    Renal function: CrCl cannot be calculated (Unknown ideal weight.).  Past Medical History:  Diagnosis Date   Diverticulosis of colon    GERD (gastroesophageal reflux disease)    watches diet   Hemorrhoids, internal    History of adenomatous polyp of colon    History of anal fissures    History of basal cell carcinoma     History of COVID-19 01/2021   per pt mild symptoms that resolved   History of primary hyperparathyroidism 01/2016   endocrinologist--- dr Lonzo Cloud;  s/p right superior parathyroidectomy 06/ 2018,  adenoma   Hyperlipidemia    Hypertension    Hypertrophic cardiomyopathy [place])    cardiologist--- dr Anne Fu   IBS (irritable bowel syndrome)    Inguinal hernia, bilateral    Nocturia    PONV (postoperative nausea and vomiting)    RBBB (right bundle branch block with left anterior fascicular block)    Seasonal allergies    Type 2 diabetes mellitus (HCC)    followed by dr Lonzo Cloud (endocrinologist);;  (06-25-2021 pt stated does not check blood sugar at home)   Ulcerative colitis, left sided [facility])    followed by dr stark (GI)   Umbilical hernia    Wears glasses     Current Outpatient Medications on File Prior to Visit  Medication Sig Dispense Refill   acetaminophen (TYLENOL) 500 MG tablet  Take 500 mg by mouth every 6 (six) hours as needed.     amLODipine (NORVASC) 10 MG tablet TAKE 1 TABLET BY MOUTH EVERY DAY 90 tablet 2   diclofenac (VOLTAREN) 75 MG EC tablet Take 75 mg by mouth daily.     irbesartan (AVAPRO) 300 MG tablet Take 1 tablet (300 mg total) by mouth daily. 90 tablet 3   Mesalamine (ASACOL) 400 MG CPDR DR capsule Take 3 capsules (1,200 mg total) by mouth daily. 270 capsule 3   metFORMIN (GLUCOPHAGE-XR) 500 MG 24 hr tablet Take 1 tablet (500 mg total) by mouth daily with breakfast. 90 tablet 3   metoprolol succinate (TOPROL-XL) 50 MG 24 hr tablet Take 1/2 tablet (25 mg total) by mouth daily. Take with or immediately following a meal. 90 tablet 3   rosuvastatin (CRESTOR) 20 MG tablet Take 1 tablet (20 mg total) by mouth daily. 90 tablet 3   VITAMIN D PO Take by mouth daily.     No current facility-administered medications on file prior to visit.    No Known Allergies  Blood pressure (!) 146/74, pulse 68.   Assessment/Plan: HYPERTENSION CONTROL Vitals:   06/11/23 1445  06/11/23 1509  BP: (!) 142/74 (!) 146/74    The patient's blood pressure is elevated above target today.  In order to address the patient's elevated BP: A new medication was prescribed today.      1. Hypertension -  Essential hypertension Assessment: Patient's blood pressure has been trending up, likely as a result of increased pain/stress over the past three weeks while off diclofenac for his hip arthritis.  Patient concerned that he is on multiple meds for HTN and they are not working as they should.  No reported symptoms of hypo/hypertension. Patient has gone workup for secondary causes for HTN including hyperaldosteronism which was negative.  Home BP average 146/79 mmHg not at goal of < 130/80 mmHg Vitals today: BP 142/74 and 146/74 mmHg - not at goal; HR 68 - appropriate for beta blocker Patient is currently on max doses of irbesartan and amlodipine. Candidate for intensified hypertension regimen with addition of spironolactone.    Plan: Encouraged patient that HTN is often managed with several agents and he is not in the "resistant HTN" category. Initiated spironolactone 12.5 mg daily today. Educated on morning dosing to avoid nocturia and to contact clinic if gynecomastia occurs.  Recheck BMET in 2 weeks. Recommended patient continue to work back up to his baseline exercise regimen of >3x weekly on his bike/elliptical and moderate amount of sweets.   Lab work in 2 weeks Follow up in clinic in 2 months   Thank you,  Lendon Ka, PharmD Candidate 2025 [address] [facility] 06/11/23  Patrick Floyd, Pharm.D, BCACP, CPP [facility] [address] [place], [place] [address]  Phone: [phone]; Fax: [phone]

## 2023-06-11 NOTE — Assessment & Plan Note (Addendum)
 Assessment: Patient's blood pressure has been trending up, likely as a result of increased pain/stress over the past three weeks while off diclofenac for his hip arthritis.  Patient concerned that he is on multiple meds for HTN and they are not working as they should.  No reported symptoms of hypo/hypertension. Patient has gone workup for secondary causes for HTN including hyperaldosteronism which was negative.  Home BP average 146/79 mmHg not at goal of < 130/80 mmHg Vitals today: BP 142/74 and 146/74 mmHg - not at goal; HR 68 - appropriate for beta blocker Patient is currently on max doses of irbesartan and amlodipine. Candidate for intensified hypertension regimen with addition of spironolactone.    Plan: Encouraged patient that HTN is often managed with several agents and he is not in the "resistant HTN" category. Initiated spironolactone 12.5 mg daily today. Educated on morning dosing to avoid nocturia and to contact clinic if gynecomastia occurs.  Recheck BMET in 2 weeks. Recommended patient continue to work back up to his baseline exercise regimen of >3x weekly on his bike/elliptical and moderate amount of sweets.   Lab work in 2 weeks Follow up in clinic in 2 months

## 2023-06-11 NOTE — Patient Instructions (Addendum)
 START taking spironolactone 12.5 mg (1/2 tablet of 25 mg) by mouth once daily. Take in the mornings to avoid increased urination at night.  Repeat labs in two weeks after starting spironolactone.   Your blood pressure goal is < 130/39mmHg  Important lifestyle changes to control high blood pressure  Intervention  Effect on the BP   Weight loss Weight loss is one of the most effective lifestyle changes for controlling blood pressure. If you're overweight or obese, losing even a small amount of weight can help reduce blood pressure.    Blood pressure can decrease by 1 millimeter of mercury (mmHg) with each kilogram (about 2.2 pounds) of weight lost.   Exercise regularly As a general goal, aim for 30 minutes of moderate physical activity every day.    Regular physical activity can lower blood pressure by 5 - 8 mmHg.   Eat a healthy diet Eat a diet rich in whole grains, fruits, vegetables, lean meat, and low-fat dairy products. Limit processed foods, saturated fat, and sweets.    A heart-healthy diet can lower high blood pressure by 10 mmHg.   Reduce salt (sodium) in your diet Aim for 000mg  of sodium each day. Avoid deli meats, canned food, and frozen microwave meals which are high in sodium.     Limiting sodium can reduce blood pressure by 5 mmHg.   Limit alcohol One drink equals 12 ounces of beer, 5 ounces of wine, or 1.5 ounces of 80-proof liquor.    Limiting alcohol to < 1 drink a day for women or < 2 drinks a day for men can help lower blood pressure by about 4 mmHg.   To check your pressure at home you will need to:   Sit up in a chair, with feet flat on the floor and back supported. Do not cross your ankles or legs. Rest your left arm so that the cuff is about heart level. If the cuff goes on your upper arm, then just relax your arm on the table, arm of the chair, or your lap. If you have a wrist cuff, hold your wrist against your chest at heart level. Place the cuff  snugly around your arm, about 1 inch above the crease of your elbow. The cords should be inside the groove of your elbow.  Sit quietly, with the cuff in place, for about 5 minutes. Then press the power button to start a reading. Do not talk or move while the reading is taking place.  Record your readings on a sheet of paper. Although most cuffs have a memory, it is often easier to see a pattern developing when the numbers are all in front of you.  You can repeat the reading after 1-3 minutes if it is recommended.   Make sure your bladder is empty and you have not had caffeine or tobacco within the last 30 minutes   Always bring your blood pressure log with you to your appointments. If you have not brought your monitor in to be double checked for accuracy, please bring it to your next appointment.   You can find a list of validated (accurate) blood pressure cuffs at: validatebp.org

## 2023-06-29 ENCOUNTER — Ambulatory Visit (INDEPENDENT_AMBULATORY_CARE_PROVIDER_SITE_OTHER): Payer: Medicare HMO | Admitting: Internal Medicine

## 2023-06-29 ENCOUNTER — Encounter: Payer: Self-pay | Admitting: Internal Medicine

## 2023-06-29 VITALS — BP 136/80 | HR 83 | Ht 72.0 in | Wt 202.0 lb

## 2023-06-29 DIAGNOSIS — D3501 Benign neoplasm of right adrenal gland: Secondary | ICD-10-CM | POA: Diagnosis not present

## 2023-06-29 DIAGNOSIS — E119 Type 2 diabetes mellitus without complications: Secondary | ICD-10-CM

## 2023-06-29 DIAGNOSIS — Z7984 Long term (current) use of oral hypoglycemic drugs: Secondary | ICD-10-CM | POA: Diagnosis not present

## 2023-06-29 DIAGNOSIS — E785 Hyperlipidemia, unspecified: Secondary | ICD-10-CM

## 2023-06-29 DIAGNOSIS — D3502 Benign neoplasm of left adrenal gland: Secondary | ICD-10-CM | POA: Diagnosis not present

## 2023-06-29 LAB — POCT GLYCOSYLATED HEMOGLOBIN (HGB A1C): Hemoglobin A1C: 6.1 % — AB (ref 4.0–5.6)

## 2023-06-29 LAB — POCT GLUCOSE (DEVICE FOR HOME USE): POC Glucose: 155 mg/dL — AB (ref 70–99)

## 2023-06-29 MED ORDER — ROSUVASTATIN CALCIUM 10 MG PO TABS: 10.0000 mg | ORAL_TABLET | Freq: Every day | ORAL | 3 refills | Status: AC

## 2023-06-29 MED ORDER — METFORMIN HCL ER 500 MG PO TB24: 500.0000 mg | ORAL_TABLET | Freq: Every day | ORAL | 3 refills | Status: DC

## 2023-06-29 NOTE — Progress Notes (Signed)
 Name: Patrick Floyd  Age/ Sex: 78 y.o., male   MRN/ DOB: [id number], 05-06-1945     PCP: Arcadio Knuckles, MD   Reason for Endocrinology Evaluation: Type 2 Diabetes Mellitus  Initial Endocrine Consultative Visit: 03/23/2016    PATIENT IDENTIFIER: Patrick Floyd is a 78 y.o. male with a past medical history of T2Dm, HTN , UC, ocular migraines and dyslipidemia . The patient has followed with Endocrinology clinic since 03/23/2016 for consultative assistance with management of his diabetes.  DIABETIC HISTORY:  Patrick Floyd was diagnosed with DM in 2014, he has been on metformin  since his diagnosis.  His hemoglobin A1c has ranged from 6.1% in 2023, peaking at 8.6% in 2019.  HYPERPARATHYROID HISTORY:  He was diagnosed with hyperparathyroidism in 2010.  He is status post parathyroidectomy in 2018  Last DXA 2018-low bone density    ADRENAL HISTORY: He had an abnormal renin with elevated Aldo/renin ratio 04/2022, which was checked during evaluation of HTN 180/88  mmhg, he was not on ARB at the time due to dizziness   He was initially reluctant with invasive testing  We opted to proceed with adrenal imaging prior to confirmation His renin normalized on olmesartan     Adrenal imaging 08/2022 revealed 15 mm right adrenal adenoma, 12 mm left adrenal adenoma   Saline loading ruled out hyperaldosteronism with postinfusion aldosterone of 4.8 NG/DL 06/08/8117  Patient was started on spironolactone  per cardiology in 2025 due to intolerance to multiple antihypertensive agents and BP remained elevated  SUBJECTIVE:   During the last visit (12/28/2022): A1c 6.1%    Today (06/29/2023): Patrick Floyd is here for a follow-up on diabetes and adrenal adenoma management.  Does not check glucose at home  He continues to follow-up with cardiology for hypertrophic cardiomyopathy and HTN.  Spironolactone  was recently added he did endorse initial cramps with spironolactone   He discontinued  rosuvastatin  due to leg cramps Patient did have a routine follow-up with GI for ulcerative colitis, remains asymptomatic  He continues with occasional  slight lightheadedness  Denies nausea or vomiting  Denies constipation or diarrhea  Has chronic hip pain, on diclofenac  Was seen by a dentist, noted a submandibular sebaceous cyst    HOME ENDOCRINE REGIMEN:  Metformin  500 mg XR daily  Rosuvatstain 20 mg- not taking  Vitamin D  2000 units daily     Statin: yes ACE-I/ARB: No    METER DOWNLOAD SUMMARY:    DIABETIC COMPLICATIONS: Microvascular complications:   Denies: CKD, neuropathy, retinopathy  Last Eye Exam: Completed 07/23/2022  Macrovascular complications:   Denies: CAD, CVA, PVD   HISTORY:  Past Medical History:  Past Medical History:  Diagnosis Date   Diverticulosis of colon    GERD (gastroesophageal reflux disease)    watches diet   Hemorrhoids, internal    History of adenomatous polyp of colon    History of anal fissures    History of basal cell carcinoma    History of COVID-19 01/2021   per pt mild symptoms that resolved   History of primary hyperparathyroidism 01/2016   endocrinologist--- dr Rosalea Collin;  s/p right superior parathyroidectomy 06/ 2018,  adenoma   Hyperlipidemia    Hypertension    Hypertrophic cardiomyopathy [facility])    cardiologist--- dr Renna Cary   IBS (irritable bowel syndrome)    Inguinal hernia, bilateral    Nocturia    PONV (postoperative nausea and vomiting)    RBBB (right bundle branch block with left anterior fascicular block)    Seasonal allergies  Type 2 diabetes mellitus (HCC)    followed by dr Rosalea Collin (endocrinologist);;  (06-25-2021 pt stated does not check blood sugar at home)   Ulcerative colitis, left sided [facility])    followed by dr stark (GI)   Umbilical hernia    Wears glasses    Past Surgical History:  Past Surgical History:  Procedure Laterality Date   COLONOSCOPY WITH PROPOFOL   05/27/2020   by dr stark    INGUINAL HERNIA REPAIR Bilateral 06/27/2021   Procedure: LAPAROSCOPIC BILATERAL INGUINAL HERNIA REPAIR;  Surgeon: Candyce Champagne, MD;  Location: [facility];  Service: General;  Laterality: Bilateral;   MASS EXCISION Left 02/12/2017   Procedure: EXCISION 6x6 CM BACK MASS;  Surgeon: Jacolyn Matar, MD;  Location: [facility];  Service: General;  Laterality: Left;   PARATHYROIDECTOMY Right 08/20/2016   @[facility] ;  right superior   (adenoma)   UMBILICAL HERNIA REPAIR  06/27/2021   Procedure: PRIMARY UMBILICAL HERNIA REPAIR;  Surgeon: Candyce Champagne, MD;  Location: [facility] ;  Service: General;;   Social History:  reports that he quit smoking about 43 years ago. His smoking use included cigarettes. He started smoking about 48 years ago. He has never used smokeless tobacco. He reports that he does not currently use alcohol. He reports that he does not use drugs. Family History:  Family History  Problem Relation Age of Onset   Hypertension Other    Breast cancer Mother    Heart disease Father    Hypertrophic cardiomyopathy Sister    Colon cancer Neg Hx    Stomach cancer Neg Hx    Cancer Neg Hx        Colon and Prostate   Hyperparathyroidism Neg Hx    Colon polyps Neg Hx    Esophageal cancer Neg Hx    Rectal cancer Neg Hx      HOME MEDICATIONS: Allergies as of 06/29/2023   No Known Allergies      Medication List        Accurate as of June 29, 2023 10:23 AM. If you have any questions, ask your nurse or doctor.          acetaminophen  500 MG tablet Commonly known as: TYLENOL  Take 500 mg by mouth every 6 (six) hours as needed.   amLODipine  10 MG tablet Commonly known as: NORVASC  TAKE 1 TABLET BY MOUTH EVERY DAY   diclofenac 75 MG EC tablet Commonly known as: VOLTAREN Take 75 mg by mouth daily.   irbesartan  300 MG tablet Commonly known as: AVAPRO  Take 1 tablet (300 mg total) by mouth daily.   Mesalamine  400 MG Cpdr DR capsule Commonly known as:  ASACOL  Take 3 capsules (1,200 mg total) by mouth daily.   metFORMIN  500 MG 24 hr tablet Commonly known as: GLUCOPHAGE -XR Take 1 tablet (500 mg total) by mouth daily with breakfast.   metoprolol  succinate 50 MG 24 hr tablet Commonly known as: TOPROL -XL Take 1/2 tablet (25 mg total) by mouth daily. Take with or immediately following a meal.   rosuvastatin  20 MG tablet Commonly known as: CRESTOR  Take 1 tablet (20 mg total) by mouth daily.   spironolactone  25 MG tablet Commonly known as: ALDACTONE  Take 0.5 tablets (12.5 mg total) by mouth daily.   VITAMIN D  PO Take by mouth daily.         OBJECTIVE:   Vital Signs: BP 136/80 (BP Location: Left Arm, Patient Position: Sitting, Cuff Size: Normal)   Pulse 83   Ht 6' (1.829  m)   Wt 202 lb (91.6 kg)   SpO2 97%   BMI 27.40 kg/m   Wt Readings from Last 3 Encounters:  06/29/23 202 lb (91.6 kg)  05/26/23 203 lb (92.1 kg)  04/13/23 201 lb 12.8 oz (91.5 kg)     Exam: General: Pt appears well and is in NAD  Lungs: Clear with good BS bilat   Heart: RRR   Abdomen:  soft, nontender  Extremities: Trace  pretibial edema.   Neuro: MS is good with appropriate affect, pt is alert and Ox3    DM foot exam: 12/28/2022  The skin of the feet is intact without sores or ulcerations. The pedal pulses are 2+ on right and 2+ on left. The sensation is intact to a screening 5.07, 10 gram monofilament bilaterally    DATA REVIEWED:  Lab Results  Component Value Date   HGBA1C 6.1 (A) 06/29/2023   HGBA1C 6.6 (H) 04/13/2023   HGBA1C 6.2 (A) 12/28/2022     Latest Reference Range & Units 06/04/23 14:13  Sodium 134 - 144 mmol/L 139  Potassium 3.5 - 5.2 mmol/L 4.1  Chloride 96 - 106 mmol/L 99  CO2 20 - 29 mmol/L 24  Glucose 70 - 99 mg/dL 89  BUN 8 - 27 mg/dL 21  Creatinine 1.61 - 0.96 mg/dL 0.45  Calcium  8.6 - 10.2 mg/dL 9.3  BUN/Creatinine Ratio 10 - 24  25 (H)  eGFR >59 mL/min/1.73 91      Latest Reference Range & Units 12/08/22  12:29  Total CHOL/HDL Ratio 0.0 - 5.0 ratio 3.1  Cholesterol, Total 100 - 199 mg/dL 409  HDL Cholesterol >81 mg/dL 50  Triglycerides 0 - 191 mg/dL 85  VLDL Cholesterol Cal 5 - 40 mg/dL 16  LDL Chol Calc (NIH) 0 - 99 mg/dL 88      CT abdomen 06/06/8293 Adrenals/Urinary Tract: Enhancing 15 mm right adrenal nodule measures Hounsfield units of 3 pre contrast administration fifty-five postcontrast administration and 14 on delayed compatible with a benign adrenal adenoma.   Enhancing 12 mm left adrenal nodule measures Hounsfield units of 11 pre contrast administration sixty-six postcontrast administration and 16 on delayed compatible with a benign adrenal adenoma.   No hydronephrosis. Punctate nonobstructive bilateral renal stones. Kidneys demonstrate symmetric enhancement.  ASSESSMENT / PLAN / RECOMMENDATIONS:   1) Bilateral adrenal adenoma:  -Saline loading ruled out hyperaldosteronism 08/2022 - 24-hour urinary cortisol catecholamines were normal 01/2023  2)Type 2 Diabetes Mellitus, optimally controlled, With out complications - Most recent A1c of 6.1%. Goal A1c <7.0%.     -A1c has been at goal -No changes   MEDICATIONS: Continue  metformin  500 mg XR daily    2) Diabetic complications:  Eye: Does not have known diabetic retinopathy.  Neuro/ Feet: Does not have known diabetic peripheral neuropathy .  Renal: Patient does not have known baseline CKD. He is intolerant to losartan.  We discussed renal benefits of ARB and ACE inhibitors   3) Dyslipidemia:  -LDL has improved  - He recently discontinued rosuvastatin  due to leg cramps - I did encourage muscle stretching with yoga or any other exercises for that purpose - Patient is in agreement to restart rosuvastatin , I will change the dose as below    Medication Stop rosuvastatin  20 mg, half a tablet at bedtime Start rosuvastatin  10 mg daily  Follow-up in 6 months    Signed electronically by: Natale Bail, MD  [facility] Endocrinology  [facility] [address] [place] [place]  [address] [place], [place] [address] Phone: [phone] FAX: [phone]   CC: Arcadio Knuckles, MD [address] [place] [address] Phone: [phone]  Fax: [phone]  Return to Endocrinology clinic as below: Future Appointments  Date Time Provider Department Center  08/12/2023  1:45 PM Maccia, Melissa D, RPH-CPP [facility]

## 2023-06-29 NOTE — Patient Instructions (Signed)
Continue  Metformin 500 mg XR daily   

## 2023-07-27 DIAGNOSIS — E119 Type 2 diabetes mellitus without complications: Secondary | ICD-10-CM | POA: Diagnosis not present

## 2023-07-27 DIAGNOSIS — H524 Presbyopia: Secondary | ICD-10-CM | POA: Diagnosis not present

## 2023-07-27 DIAGNOSIS — H2513 Age-related nuclear cataract, bilateral: Secondary | ICD-10-CM | POA: Diagnosis not present

## 2023-07-27 LAB — HM DIABETES EYE EXAM

## 2023-08-09 DIAGNOSIS — I10 Essential (primary) hypertension: Secondary | ICD-10-CM | POA: Diagnosis not present

## 2023-08-10 ENCOUNTER — Ambulatory Visit: Payer: Self-pay | Admitting: Pharmacist

## 2023-08-10 LAB — BASIC METABOLIC PANEL WITH GFR
BUN/Creatinine Ratio: 24 (ref 10–24)
BUN: 25 mg/dL (ref 8–27)
CO2: 23 mmol/L (ref 20–29)
Calcium: 9.2 mg/dL (ref 8.6–10.2)
Chloride: 103 mmol/L (ref 96–106)
Creatinine, Ser: 1.05 mg/dL (ref 0.76–1.27)
Glucose: 121 mg/dL — ABNORMAL HIGH (ref 70–99)
Potassium: 4.3 mmol/L (ref 3.5–5.2)
Sodium: 140 mmol/L (ref 134–144)
eGFR: 74 mL/min/{1.73_m2} (ref >59)

## 2023-08-12 ENCOUNTER — Ambulatory Visit: Attending: Cardiovascular Disease | Admitting: Pharmacist

## 2023-08-12 VITALS — BP 160/78 | HR 70

## 2023-08-12 DIAGNOSIS — I10 Essential (primary) hypertension: Secondary | ICD-10-CM

## 2023-08-12 MED ORDER — SPIRONOLACTONE 25 MG PO TABS: 25.0000 mg | ORAL_TABLET | Freq: Every day | ORAL | 3 refills | Status: DC

## 2023-08-12 NOTE — Assessment & Plan Note (Signed)
 Assessment: Blood pressure elevated in clinic today.  It is typical for patient's blood pressure to be higher in clinic but at home Home blood pressure readings still above goal less than 130/80 Appears that stress and anxiety and worry do tend to have a great effect on his blood pressure Patient admits that he would benefit from speaking with a therapist Whether diclofenac is having an effect on his blood pressure or not, irregardless long-term use is not ideal for his heart or kidneys.  Discussed risk versus benefit.  Recommended stretching yoga and potentially seeing a chiropractor to see if this would provide him any benefit I think yoga is also a great option for stress relieving  Plan: Increase spironolactone  to 25 mg a day Continue  amlodipine  10 mg daily (AM), irbesartan  300 mg daily, metoprolol  succinate 25mg  daily BMP in 2 weeks Follow-up in 1.5 months

## 2023-08-12 NOTE — Patient Instructions (Addendum)
 Patrick Floyd- [facility] Chiropractor- [phone] Yoga  Please increase spironolactone  to 25mg  daily  Continue amlodipine  10 mg daily (AM), irbesartan  300 mg daily, metoprolol  succinate 25mg  daily  Please come for lab work in 2 weeks  Please consider seeing a mental health counselor

## 2023-08-12 NOTE — Progress Notes (Signed)
 Patient ID: Patrick Floyd                 DOB: 04-May-1945                      MRN: [id number]      HPI: Patrick Floyd is a 78 y.o. male referred by Dr. Renna Cary to HTN clinic. PMH is significant for hypertrophic cardiomyopathy, T2DM, ulcerative colitis, hyperlipidemia, hyperparathyroidism s/p parathyroidectomy. Hyperaldosteronism was ruled out. Adrenal imaging 08/2022 revealed 15 mm right adrenal adenoma, 12 mm left adrenal adenoma.  Back in Feb 2024, patient was started on omlesartan and indapamide  by PCP but patient stopped taking stating they made him tired. Olmesartan  10mg  daily was resumed, which was later increased to 20mg  daily. He was also started on rosuvastatin  10mg  which he reported doing well on. Renal function and K stable on ARB.   Seen 4/25, no changes were made at appointment, but after his labs resulted and I discussed with Dr. Rosalea Collin, olmesartan  was increased to 40mg  daily. He contacted me via mychart starting that he had a headache, felt a little dizzy with exercise and nauseous. We decided to wait a little longer to see if symptoms would go away.    Seen 5/16, blood pressure was 128/68. No medication changes were made. Patient contacted me via mychart 6/25 requesting to decrease omlesartan due to tiredness, nausea dizziness and weakness that he contributes to olmesartan .   Seen 7/16, patient refused medication changes. Increased physical activity/intensity was encouraged. On 8/27 metoprolol  was changed to carvedilol .  Olmesartan  and carvedilol  stopped at appointment with Dr. Renna Cary 10/8. Nebivolol  10 mg daily started.  Seen 03/18/23, patient had stopped nebivolol  (felt like it was affecting his balance and leg strength) and had gone back to metoprolol . I stopped his metoprolol  thinking maybe it was contributing to his fatigue. Irbesartan  75mg  was started. Via mychart, it was increased to 150mg  daily.   Patient messaged me stating he had an episode where he had some  palpitations and overall did not feel well after cleaning out a closet.  He sat down and felt better after about 20 minutes.  Also reported some palpitations in the evening. At his appointment 03/18/23, a few days later, metoprolol  was resumed and his irbesartan  was increased back to 150mg  daily.   Last seen 05/05/23 in clinic. Reported feeling well. He believed his symptoms have improved with resumption of metoprolol . Discussed his lab work his PCP did and his microalbuminuria, UACR 107.8. Discussed a few medications his PCP could be considering for his CKD including SGLT2 or [place]. Home BP averaging about 141/78. Heart rate mainly mid 60s. Increased irbesartan  to 300 mg daily.   4/4 BMP WNL, appears a little dehydrated with BUN/Cr > 20. Patient message on 4/7 indicated BP mainly in low 130s-low 140s/mid 70s-80 which is an improvement from previous.  At last visit spironolactone  12.5mg  daily was added.  Follow-up BMP within normal limits.  Patient presents today for follow-up.  Blood pressure at home has ranged from the high 130s to low 140s/70s.  Patient reports feeling fatigued and foggy headed.  Does admit that he has not slept well the last several nights.  He has a lot of stress going on with his family.  He is very affected by the struggles of his children.  Does admit that he would probably benefit from seeing mental health counselor.  Taking diclofenac every 2 to 3 days.  When he does not take it  he notices pain in his shoulders and hip.  Tries taking Tylenol  but does not seem to provide much relief.  Dr. Rosalea Collin recommended he try yoga and get off the diclofenac.  He is considering this.  I agree that this sounded like a good idea, also consider seeing chiropractor.  Has not been exercising as much in the last 3 weeks due to being out of town and then having company.  States that he does enjoy exercising.  Current mediations: amlodipine  10 mg daily (AM), irbesartan  300 mg daily, metoprolol   succinate 25mg  daily, spironolactone  12.5mg  daily Previously tried mediations: olmesartan  40 mg, indapamide  (didn't react well), metoprolol , carvedilol , nebivolol  (leg weakness). Hydrochlorothiazide , indapamide  (didn't like the way it made him feel) BP goal: <130/80   Family History:  Mother- heart attack  Father - HTN, heart attack  Siblings- colitis, cardiomyopathy    Social History:  Alcohol: occasional 1-2 drinks per week Smoking: quit 42 years ago   Diet: eats home cooked meals most days of the week eats out once or twice week can reduce that to every other day. Limits salt intake  Exercise: 30-45 minutes on bike or elliptical 1x/week recently but 3 weeks ago was 3x per week, has been doing some strength training  Home BP readings:  Date SBP/DBP  HR   138/76 75   140/80 72   146/80    138/77 69   153/81 61                        Wt Readings from Last 3 Encounters:  06/29/23 202 lb (91.6 kg)  05/26/23 203 lb (92.1 kg)  04/13/23 201 lb 12.8 oz (91.5 kg)   BP Readings from Last 3 Encounters:  08/12/23 (!) 160/78  06/29/23 136/80  06/11/23 (!) 146/74   Pulse Readings from Last 3 Encounters:  08/12/23 70  06/29/23 83  06/11/23 68    Renal function: CrCl cannot be calculated (Unknown ideal weight.).  Past Medical History:  Diagnosis Date   Diverticulosis of colon    GERD (gastroesophageal reflux disease)    watches diet   Hemorrhoids, internal    History of adenomatous polyp of colon    History of anal fissures    History of basal cell carcinoma    History of COVID-19 01/2021   per pt mild symptoms that resolved   History of primary hyperparathyroidism 01/2016   endocrinologist--- dr Rosalea Collin;  s/p right superior parathyroidectomy 06/ 2018,  adenoma   Hyperlipidemia    Hypertension    Hypertrophic cardiomyopathy [facility])    cardiologist--- dr Renna Cary   IBS (irritable bowel syndrome)    Inguinal hernia, bilateral    Nocturia    PONV (postoperative  nausea and vomiting)    RBBB (right bundle branch block with left anterior fascicular block)    Seasonal allergies    Type 2 diabetes mellitus (HCC)    followed by dr Rosalea Collin (endocrinologist);;  (06-25-2021 pt stated does not check blood sugar at home)   Ulcerative colitis, left sided [facility])    followed by dr stark (GI)   Umbilical hernia    Wears glasses     Current Outpatient Medications on File Prior to Visit  Medication Sig Dispense Refill   amLODipine  (NORVASC ) 10 MG tablet TAKE 1 TABLET BY MOUTH EVERY DAY 90 tablet 2   irbesartan  (AVAPRO ) 300 MG tablet Take 1 tablet (300 mg total) by mouth daily. 90 tablet 3   metoprolol  succinate (TOPROL -XL)  50 MG 24 hr tablet Take 1/2 tablet (25 mg total) by mouth daily. Take with or immediately following a meal. 90 tablet 3   acetaminophen  (TYLENOL ) 500 MG tablet Take 500 mg by mouth every 6 (six) hours as needed.     diclofenac (VOLTAREN) 75 MG EC tablet Take 75 mg by mouth daily.     Mesalamine  (ASACOL ) 400 MG CPDR DR capsule Take 3 capsules (1,200 mg total) by mouth daily. 270 capsule 3   metFORMIN  (GLUCOPHAGE -XR) 500 MG 24 hr tablet Take 1 tablet (500 mg total) by mouth daily with breakfast. 90 tablet 3   rosuvastatin  (CRESTOR ) 10 MG tablet Take 1 tablet (10 mg total) by mouth daily. 90 tablet 3   VITAMIN D  PO Take by mouth daily.     No current facility-administered medications on file prior to visit.    No Known Allergies  Blood pressure (!) 160/78, pulse 70.   Assessment/Plan: HYPERTENSION CONTROL Vitals:   08/12/23 1405 08/12/23 1408  BP: (!) 170/80 (!) 160/78    The patient's blood pressure is elevated above target today.  In order to address the patient's elevated BP: A current anti-hypertensive medication was adjusted today.      1. Hypertension -  Essential hypertension Assessment: Blood pressure elevated in clinic today.  It is typical for patient's blood pressure to be higher in clinic but at home Home blood  pressure readings still above goal less than 130/80 Appears that stress and anxiety and worry do tend to have a great effect on his blood pressure Patient admits that he would benefit from speaking with a therapist Whether diclofenac is having an effect on his blood pressure or not, irregardless long-term use is not ideal for his heart or kidneys.  Discussed risk versus benefit.  Recommended stretching yoga and potentially seeing a chiropractor to see if this would provide him any benefit I think yoga is also a great option for stress relieving  Plan: Increase spironolactone  to 25 mg a day Continue  amlodipine  10 mg daily (AM), irbesartan  300 mg daily, metoprolol  succinate 25mg  daily BMP in 2 weeks Follow-up in 1.5 months   Thank you,   Jaylynne Birkhead D Jasmin Trumbull, Pharm.D, BCACP, CPP [facility] [address], [place], [place] [address]  Phone: [phone]; Fax: [phone]

## 2023-09-10 ENCOUNTER — Encounter: Payer: Self-pay | Admitting: Pharmacist

## 2023-09-10 MED ORDER — METOPROLOL SUCCINATE ER 50 MG PO TB24: 25.0000 mg | ORAL_TABLET | Freq: Every day | ORAL | 3 refills | Status: DC

## 2023-09-14 DIAGNOSIS — I10 Essential (primary) hypertension: Secondary | ICD-10-CM | POA: Diagnosis not present

## 2023-09-15 ENCOUNTER — Ambulatory Visit: Payer: Self-pay | Admitting: Pharmacist

## 2023-09-15 LAB — BASIC METABOLIC PANEL WITH GFR
BUN/Creatinine Ratio: 30 — ABNORMAL HIGH (ref 10–24)
BUN: 30 mg/dL — ABNORMAL HIGH (ref 8–27)
CO2: 22 mmol/L (ref 20–29)
Calcium: 9.8 mg/dL (ref 8.6–10.2)
Chloride: 102 mmol/L (ref 96–106)
Creatinine, Ser: 0.99 mg/dL (ref 0.76–1.27)
Glucose: 102 mg/dL — ABNORMAL HIGH (ref 70–99)
Potassium: 4.9 mmol/L (ref 3.5–5.2)
Sodium: 141 mmol/L (ref 134–144)
eGFR: 79 mL/min/1.73 (ref >59)

## 2023-10-04 ENCOUNTER — Ambulatory Visit: Attending: Cardiovascular Disease | Admitting: Pharmacist

## 2023-10-04 VITALS — BP 160/74 | HR 69

## 2023-10-04 DIAGNOSIS — I10 Essential (primary) hypertension: Secondary | ICD-10-CM | POA: Diagnosis not present

## 2023-10-04 NOTE — Assessment & Plan Note (Signed)
 Assessment: Blood pressure in clinic is elevated but his home blood pressure readings have been in the 120s to low 130s systolic and 60s to 70s diastolic Patient is exercising more frequently about 4 times a week which has helped both his blood pressure, hip pain and mental health-doing combination of aerobic exercise and weight training Stop spironolactone  due to breast tenderness and muscle spasms  Plan: Continue amlodipine  10 mg daily, irbesartan  300 mg daily, metoprolol  succinate 25 mg daily Continue exercise and increase frequency as able Follow-up with Dr. Jeffrie in October

## 2023-10-04 NOTE — Progress Notes (Signed)
 Patient ID: Patrick Floyd                 DOB: 11/11/1945                      MRN: [id number]      HPI: Patrick Floyd is a 78 y.o. male referred by Dr. Jeffrie to HTN clinic. PMH is significant for hypertrophic cardiomyopathy, T2DM, ulcerative colitis, hyperlipidemia, hyperparathyroidism s/p parathyroidectomy. Hyperaldosteronism was ruled out. Adrenal imaging 08/2022 revealed 15 mm right adrenal adenoma, 12 mm left adrenal adenoma.  Back in Feb 2024, patient was started on omlesartan and indapamide  by PCP but patient stopped taking stating they made him tired. Olmesartan  10mg  daily was resumed, which was later increased to 20mg  daily. He was also started on rosuvastatin  10mg  which he reported doing well on. Renal function and K stable on ARB.   Seen 4/25, no changes were made at appointment, but after his labs resulted and I discussed with Dr. Sam, olmesartan  was increased to 40mg  daily. He contacted me via mychart starting that he had a headache, felt a little dizzy with exercise and nauseous. We decided to wait a little longer to see if symptoms would go away.    Seen 5/16, blood pressure was 128/68. No medication changes were made. Patient contacted me via mychart 6/25 requesting to decrease omlesartan due to tiredness, nausea dizziness and weakness that he contributes to olmesartan .   Seen 7/16, patient refused medication changes. Increased physical activity/intensity was encouraged. On 8/27 metoprolol  was changed to carvedilol .  Olmesartan  and carvedilol  stopped at appointment with Dr. Jeffrie 10/8. Nebivolol  10 mg daily started.  Seen 03/18/23, patient had stopped nebivolol  (felt like it was affecting his balance and leg strength) and had gone back to metoprolol . I stopped his metoprolol  thinking maybe it was contributing to his fatigue. Irbesartan  75mg  was started. Via mychart, it was increased to 150mg  daily.   Patient messaged me stating he had an episode where he had some  palpitations and overall did not feel well after cleaning out a closet.  He sat down and felt better after about 20 minutes.  Also reported some palpitations in the evening. At his appointment 03/18/23, a few days later, metoprolol  was resumed and his irbesartan  was increased back to 150mg  daily.   Last seen 05/05/23 in clinic. Reported feeling well. He believed his symptoms have improved with resumption of metoprolol . Discussed his lab work his PCP did and his microalbuminuria, UACR 107.8. Discussed a few medications his PCP could be considering for his CKD including SGLT2 or [place]. Home BP averaging about 141/78. Heart rate mainly mid 60s. Increased irbesartan  to 300 mg daily.   4/4 BMP WNL, appears a little dehydrated with BUN/Cr > 20. Patient message on 4/7 indicated BP mainly in low 130s-low 140s/mid 70s-80 which is an improvement from previous.  At visit 4/11 spironolactone  12.5mg  daily was added.  Follow-up BMP within normal limits. At last visit 6/12 spironolactone  was increased to 25mg  daily. We discussed managing stress and hip pain.   Patient presents today for follow-up.  Reports that he stopped taking spironolactone  due to breast tenderness and leg spasms.  He stopped over a week ago and has not noticed much difference in his blood pressure.  He has been exercising more frequently which he admits is helping with his hip pain.  Only taking diclofenac about once a week.  Has not seen a therapist but is still considering it.  Has  not seen a chiropractor because his hip is feeling a lot better with his increase in exercise.  He also feels as though his mental health is improved with increase in exercise as well.   Current mediations: amlodipine  10 mg daily (AM), irbesartan  300 mg daily, metoprolol  succinate 25mg  daily Previously tried mediations: olmesartan  40 mg, indapamide  (didn't react well), metoprolol , carvedilol , nebivolol  (leg weakness). Hydrochlorothiazide , indapamide  (didn't like the  way it made him feel) BP goal: <130/80   Family History:  Mother- heart attack  Father - HTN, heart attack  Siblings- colitis, cardiomyopathy    Social History:  Alcohol: occasional 1-2 drinks per week Smoking: quit 42 years ago   Diet: eats home cooked meals most days of the week eats out once or twice week can reduce that to every other day. Limits salt intake  Exercise: 30-45 minutes on bike or elliptical 3-4x per week, has been doing some strength training  Home BP readings:  Date SBP/DBP  HR   125/72    135/74    158/85    127/68    130/72    125/67    132/73                 Wt Readings from Last 3 Encounters:  06/29/23 202 lb (91.6 kg)  05/26/23 203 lb (92.1 kg)  04/13/23 201 lb 12.8 oz (91.5 kg)   BP Readings from Last 3 Encounters:  10/04/23 (!) 160/74  08/12/23 (!) 160/78  06/29/23 136/80   Pulse Readings from Last 3 Encounters:  10/04/23 69  08/12/23 70  06/29/23 83    Renal function: CrCl cannot be calculated (Unknown ideal weight.).  Past Medical History:  Diagnosis Date   Diverticulosis of colon    GERD (gastroesophageal reflux disease)    watches diet   Hemorrhoids, internal    History of adenomatous polyp of colon    History of anal fissures    History of basal cell carcinoma    History of COVID-19 01/2021   per pt mild symptoms that resolved   History of primary hyperparathyroidism 01/2016   endocrinologist--- dr sam;  s/p right superior parathyroidectomy 06/ 2018,  adenoma   Hyperlipidemia    Hypertension    Hypertrophic cardiomyopathy [facility])    cardiologist--- dr jeffrie   IBS (irritable bowel syndrome)    Inguinal hernia, bilateral    Nocturia    PONV (postoperative nausea and vomiting)    RBBB (right bundle branch block with left anterior fascicular block)    Seasonal allergies    Type 2 diabetes mellitus (HCC)    followed by dr sam (endocrinologist);;  (06-25-2021 pt stated does not check blood sugar at home)    Ulcerative colitis, left sided (HCC)    followed by dr stark (GI)   Umbilical hernia    Wears glasses     Current Outpatient Medications on File Prior to Visit  Medication Sig Dispense Refill   acetaminophen  (TYLENOL ) 500 MG tablet Take 500 mg by mouth every 6 (six) hours as needed.     amLODipine  (NORVASC ) 10 MG tablet TAKE 1 TABLET BY MOUTH EVERY DAY 90 tablet 2   diclofenac (VOLTAREN) 75 MG EC tablet Take 75 mg by mouth daily.     irbesartan  (AVAPRO ) 300 MG tablet Take 1 tablet (300 mg total) by mouth daily. 90 tablet 3   Mesalamine  (ASACOL ) 400 MG CPDR DR capsule Take 3 capsules (1,200 mg total) by mouth daily. 270 capsule 3   metFORMIN  (  GLUCOPHAGE -XR) 500 MG 24 hr tablet Take 1 tablet (500 mg total) by mouth daily with breakfast. 90 tablet 3   metoprolol  succinate (TOPROL -XL) 50 MG 24 hr tablet Take 1/2 tablet (25 mg total) by mouth daily. Take with or immediately following a meal. 90 tablet 3   rosuvastatin  (CRESTOR ) 10 MG tablet Take 1 tablet (10 mg total) by mouth daily. 90 tablet 3   VITAMIN D  PO Take by mouth daily.     No current facility-administered medications on file prior to visit.    Allergies  Allergen Reactions   Spironolactone  Other (See Comments)    Breast tenderness, leg spasms    Blood pressure (!) 160/74, pulse 69.   Assessment/Plan: HYPERTENSION CONTROL Vitals:   10/04/23 1024 10/04/23 1028  BP: (!) 154/80 (!) 160/74    The patient's blood pressure is elevated above target today.  In order to address the patient's elevated BP: Blood pressure will be monitored at home to determine if medication changes need to be made.      1. Hypertension -  Essential hypertension Assessment: Blood pressure in clinic is elevated but his home blood pressure readings have been in the 120s to low 130s systolic and 60s to 70s diastolic Patient is exercising more frequently about 4 times a week which has helped both his blood pressure, hip pain and mental  health-doing combination of aerobic exercise and weight training Stop spironolactone  due to breast tenderness and muscle spasms  Plan: Continue amlodipine  10 mg daily, irbesartan  300 mg daily, metoprolol  succinate 25 mg daily Continue exercise and increase frequency as able Follow-up with Dr. Jeffrie in October    Thank you,   Eleanor JONETTA Crews, Pharm.D, BCACP, CPP [facility] A Division of [facility] [address], [place], [place] [address]  Phone: [phone]; Fax: [phone]

## 2023-10-04 NOTE — Patient Instructions (Addendum)
 Continue amlodipine  10 mg daily, irbesartan  300 mg daily and metoprolol  succinate 25mg  daily  Continue with exercise- try to increase if you can  Follow up with Dr. Jeffrie in October

## 2023-11-05 ENCOUNTER — Encounter: Payer: Self-pay | Admitting: Cardiology

## 2023-11-26 ENCOUNTER — Encounter: Payer: Self-pay | Admitting: Internal Medicine

## 2023-11-30 ENCOUNTER — Ambulatory Visit: Payer: Self-pay | Admitting: Cardiology

## 2023-11-30 ENCOUNTER — Ambulatory Visit (HOSPITAL_COMMUNITY)
Admission: RE | Admit: 2023-11-30 | Discharge: 2023-11-30 | Disposition: A | Discharge status: Home / Self Care | Source: Ambulatory Visit | Attending: Cardiology | Admitting: Cardiology

## 2023-11-30 ENCOUNTER — Encounter: Payer: Self-pay | Admitting: Internal Medicine

## 2023-11-30 DIAGNOSIS — I7121 Aneurysm of the ascending aorta, without rupture: Secondary | ICD-10-CM | POA: Diagnosis not present

## 2023-11-30 DIAGNOSIS — I77819 Aortic ectasia, unspecified site: Secondary | ICD-10-CM | POA: Insufficient documentation

## 2023-11-30 DIAGNOSIS — E785 Hyperlipidemia, unspecified: Secondary | ICD-10-CM | POA: Insufficient documentation

## 2023-11-30 DIAGNOSIS — I5030 Unspecified diastolic (congestive) heart failure: Secondary | ICD-10-CM | POA: Diagnosis not present

## 2023-11-30 DIAGNOSIS — I10 Essential (primary) hypertension: Secondary | ICD-10-CM | POA: Diagnosis not present

## 2023-11-30 DIAGNOSIS — I77810 Thoracic aortic ectasia: Secondary | ICD-10-CM | POA: Insufficient documentation

## 2023-11-30 DIAGNOSIS — I4510 Unspecified right bundle-branch block: Secondary | ICD-10-CM | POA: Diagnosis not present

## 2023-11-30 DIAGNOSIS — E119 Type 2 diabetes mellitus without complications: Secondary | ICD-10-CM | POA: Insufficient documentation

## 2023-11-30 LAB — ECHOCARDIOGRAM COMPLETE
Area-P 1/2: 2.57 cm2
S' Lateral: 2.9 cm

## 2023-12-07 ENCOUNTER — Encounter: Payer: Self-pay | Admitting: Cardiology

## 2023-12-07 ENCOUNTER — Encounter: Payer: Self-pay | Admitting: Pharmacist

## 2023-12-07 ENCOUNTER — Ambulatory Visit: Attending: Cardiology | Admitting: Cardiology

## 2023-12-07 VITALS — BP 186/79 | HR 65 | Ht 72.0 in | Wt 204.0 lb

## 2023-12-07 DIAGNOSIS — I77819 Aortic ectasia, unspecified site: Secondary | ICD-10-CM

## 2023-12-07 DIAGNOSIS — I10 Essential (primary) hypertension: Secondary | ICD-10-CM | POA: Diagnosis not present

## 2023-12-07 DIAGNOSIS — E118 Type 2 diabetes mellitus with unspecified complications: Secondary | ICD-10-CM

## 2023-12-07 DIAGNOSIS — I422 Other hypertrophic cardiomyopathy: Secondary | ICD-10-CM

## 2023-12-07 NOTE — Progress Notes (Signed)
 Cardiology Office Note:  .   Date:  12/07/2023  ID:  Patrick Floyd, DOB [birth year], MRN [id number] PCP: Joshua Debby CROME, MD  [facility] Providers Cardiologist:  Oneil Parchment, MD     History of Present Illness: .   Patrick Floyd is a 78 y.o. male Discussed the use of AI scribe software   History of Present Illness Patrick Floyd is a 78 year old male with hypertrophic cardiomyopathy who presents for a follow-up visit.  He reports no new symptoms related to his hypertrophic cardiomyopathy and states 'hanging in there, nothing different.' He continues to exercise regularly. His sister also had hypertrophic cardiomyopathy.  He has type two diabetes.  He has ulcerative colitis.  He has hyperlipidemia and is taking Crestor  to manage his cholesterol levels, continuing regular follow-ups.  He has a history of hyperparathyroidism and underwent a parathyroidectomy in 2018. He is followed by endocrinology for this condition. He also has a history of adrenal adenomas, with a 15 mm right adrenal adenoma and a 12 mm left adrenal adenoma. Saline loading ruled out hyperaldosteronism in June 2024.  He reports issues with spironolactone , which caused breast tenderness, and he has been off it for about a month. He is currently taking amlodipine  10 mg daily, irbesartan  300 mg daily, and metoprolol  succinate 25 mg daily. His blood pressure at home ranges from 130 to 140 systolic and 75 to 80 diastolic, despite trying various medications.      Studies Reviewed: SABRA   EKG Interpretation Date/Time:  Tuesday December 07 2023 09:26:04 EDT Ventricular Rate:  65 PR Interval:  174 QRS Duration:  128 QT Interval:  430 QTC Calculation: 447 R Axis:   -30  Text Interpretation: Normal sinus rhythm Left axis deviation Right bundle branch block Minimal voltage criteria for LVH, may be normal variant ( R in aVL ) Septal infarct , age undetermined When compared with ECG of 08-Dec-2022 11:37, No  significant change since last tracing Confirmed by Parchment Oneil ([id number]) on 12/07/2023 9:45:13 AM    Results LABS Aldosterone: 4.8 (08/2022)  DIAGNOSTIC Echocardiogram: Normal pump function, EF 56%, mild asymmetric left ventricular hypertrophy of the basal septal segment, mild dilation of ascending aorta 42 mm (11/30/2023) Event monitor: No adverse arrhythmias (11/2021)  Risk Assessment/Calculations:           Physical Exam:   VS:  BP (!) 186/79   Pulse 65   Ht 6' (1.829 m)   Wt 204 lb (92.5 kg)   SpO2 98%   BMI 27.67 kg/m    Wt Readings from Last 3 Encounters:  12/07/23 204 lb (92.5 kg)  06/29/23 202 lb (91.6 kg)  05/26/23 203 lb (92.1 kg)    GEN: Well nourished, well developed in no acute distress NECK: No JVD; No carotid bruits CARDIAC: RRR, soft systolic murmurs, no rubs, no gallops RESPIRATORY:  Clear to auscultation without rales, wheezing or rhonchi  ABDOMEN: Soft, non-tender, non-distended EXTREMITIES:  No edema; No deformity   ASSESSMENT AND PLAN: .    Assessment and Plan Assessment & Plan Hypertrophic cardiomyopathy Hypertrophic cardiomyopathy is well-managed with mild asymmetric left ventricular hypertrophy of the basal septal segment. No high-risk features are present, and the condition is not severe. - Continue current medications: amlodipine , irbesartan , and metoprolol  succinate. - Encourage regular exercise.  Mild dilation of ascending aorta Mild dilation of the ascending aorta measures 42 mm on echocardiogram, stable compared to previous measurements, with no need for surgical intervention. - Repeat echocardiogram  in one year to monitor aortic size.  Essential hypertension Blood pressure remains elevated, ranging from 130 to 140 mmHg systolic and 75 to 80 mmHg diastolic. Previous medication adjustments have not significantly improved control. Spironolactone  was discontinued due to breast tenderness. Current blood pressure range is accepted as adequate  given previous medication trials. - Continue current antihypertensive regimen: amlodipine , irbesartan , and metoprolol  succinate. - Encourage regular exercise for blood pressure control. - Monitor blood pressure periodically at home. - Has a degree of whitecoat hypertension. - Continue to follow with Melissa if necessary for hypertension watch.  Hyperlipidemia Hyperlipidemia is managed with Crestor  to maintain stable plaque and reduce cardiovascular risk. - Continue Crestor  as prescribed.         Dispo: 1 yr  Signed, Oneil Parchment, MD

## 2023-12-07 NOTE — Patient Instructions (Addendum)
 Medication Instructions:  The current medical regimen is effective;  continue present plan and medications.  *If you need a refill on your cardiac medications before your next appointment, please call your pharmacy*  Testing: Repeat echo in 1 yr.  Follow-Up: At [facility], you and your health needs are our priority.  As part of our continuing mission to provide you with exceptional heart care, our providers are all part of one team.  This team includes your primary Cardiologist (physician) and Advanced Practice Providers or APPs (Physician Assistants and Nurse Practitioners) who all work together to provide you with the care you need, when you need it.  Your next appointment:   1 year(s)  Provider:   Oneil Parchment, MD    We recommend signing up for the patient portal called MyChart.  Sign up information is provided on this After Visit Summary.  MyChart is used to connect with patients for Virtual Visits (Telemedicine).  Patients are able to view lab/test results, encounter notes, upcoming appointments, etc.  Non-urgent messages can be sent to your provider as well.   To learn more about what you can do with MyChart, go to [email or web address].

## 2023-12-16 ENCOUNTER — Encounter: Payer: Self-pay | Admitting: *Deleted

## 2023-12-16 NOTE — Progress Notes (Signed)
 Patrick Floyd                                          MRN: [id number]   12/16/2023   The VBCI Quality Team Specialist reviewed this patient medical record for the purposes of chart review for care gap closure. The following were reviewed: chart review for care gap closure-controlling blood pressure.    VBCI Quality Team

## 2023-12-20 ENCOUNTER — Encounter: Payer: Self-pay | Admitting: Pharmacist

## 2023-12-20 NOTE — Progress Notes (Unsigned)
 Pharmacy Quality Measure Review  This patient is appearing on a report for being at risk of failing the adherence measure for diabetes medications this calendar year.   Medication: Metformin  Last fill date: 06/21/23 for 90 day supply  MyChart message sent to patient. Pt reports adherence to 1 tablet per day and has supply remaining for at least 2 months. He notes he had extra supply due to dose decrease from 2 tablets per day to 1 per day. No further action needed.   Darrelyn Drum, PharmD, BCPS, CPP Clinical Pharmacist Practitioner [facility] [phone]

## 2023-12-27 ENCOUNTER — Other Ambulatory Visit: Payer: Self-pay | Admitting: Internal Medicine

## 2023-12-27 ENCOUNTER — Encounter: Payer: Self-pay | Admitting: Internal Medicine

## 2023-12-27 ENCOUNTER — Ambulatory Visit: Admitting: Internal Medicine

## 2023-12-27 VITALS — BP 134/86 | HR 71 | Ht 72.0 in | Wt 203.2 lb

## 2023-12-27 DIAGNOSIS — F419 Anxiety disorder, unspecified: Secondary | ICD-10-CM

## 2023-12-27 DIAGNOSIS — D3502 Benign neoplasm of left adrenal gland: Secondary | ICD-10-CM

## 2023-12-27 DIAGNOSIS — Z7984 Long term (current) use of oral hypoglycemic drugs: Secondary | ICD-10-CM | POA: Diagnosis not present

## 2023-12-27 DIAGNOSIS — E119 Type 2 diabetes mellitus without complications: Secondary | ICD-10-CM | POA: Diagnosis not present

## 2023-12-27 DIAGNOSIS — D3501 Benign neoplasm of right adrenal gland: Secondary | ICD-10-CM | POA: Diagnosis not present

## 2023-12-27 DIAGNOSIS — N62 Hypertrophy of breast: Secondary | ICD-10-CM | POA: Diagnosis not present

## 2023-12-27 LAB — POCT GLYCOSYLATED HEMOGLOBIN (HGB A1C): Hemoglobin A1C: 6.1 % — AB (ref 4.0–5.6)

## 2023-12-27 LAB — POCT GLUCOSE (DEVICE FOR HOME USE): POC Glucose: 195 mg/dL — AB (ref 70–99)

## 2023-12-27 MED ORDER — METFORMIN HCL ER 500 MG PO TB24
500.0000 mg | ORAL_TABLET | Freq: Every day | ORAL | 3 refills | Status: AC
Start: 2023-12-27 — End: ?

## 2023-12-27 MED ORDER — DEXAMETHASONE 1 MG PO TABS: 1.0000 mg | ORAL_TABLET | Freq: Once | ORAL | 0 refills | Status: AC

## 2023-12-27 NOTE — Progress Notes (Addendum)
 Name: Patrick Floyd  Age/ Sex: 78 y.o., male   MRN/ DOB: [id number], [birth year]     PCP: Joshua Debby CROME, MD   Reason for Endocrinology Evaluation: Type 2 Diabetes Mellitus  Initial Endocrine Consultative Visit: 03/23/2016    PATIENT IDENTIFIER: Patrick Floyd is a 78 y.o. male with a past medical history of T2Dm, HTN , UC, ocular migraines and dyslipidemia . The patient has followed with Endocrinology clinic since 03/23/2016 for consultative assistance with management of his diabetes.  DIABETIC HISTORY:  Mr. Martindale was diagnosed with DM in 2014, he has been on metformin  since his diagnosis.  His hemoglobin A1c has ranged from 6.1% in 2023, peaking at 8.6% in 2019.  HYPERPARATHYROID HISTORY:  He was diagnosed with hyperparathyroidism in 2010.  He is status post parathyroidectomy in 2018  Last DXA 2018-low bone density    ADRENAL HISTORY: He had an abnormal renin with elevated Aldo/renin ratio 04/2022, which was checked during evaluation of HTN 180/88  mmhg, he was not on ARB at the time due to dizziness   He was initially reluctant with invasive testing  We opted to proceed with adrenal imaging prior to confirmation His renin normalized on olmesartan     Adrenal imaging 08/2022 revealed 15 mm right adrenal adenoma, 12 mm left adrenal adenoma   Saline loading ruled out hyperaldosteronism with postinfusion aldosterone of 4.8 NG/DL 3/71/7975  Patient was started on spironolactone  per cardiology in 2025 due to intolerance to multiple antihypertensive agents and BP remained elevated   Spironolactone  discontinued due to breast tenderness September, 2025  SUBJECTIVE:   During the last visit (06/29/2023): A1c 6.1%    Today (12/27/2023): Patrick Floyd is here for a follow-up on diabetes and adrenal adenoma management.  He doesn't  check glucose at home    He continues to follow-up with cardiology for hypertrophic cardiomyopathy and HTN.   He developed breast  tenderness while on spironolactone , which she discontinued September, 2025.  Patient continues with breast tenderness, he can ask again if there is any enlargement Patient denies any rectal dysfunction Follows with GI for ulcerative colitis Weight stable  No nausea or vomiting  No constipation or diarrhea  Has occasional palpitations  No recent dizziness  No syncope or pre-syncope  No headaches or vision changes  Patient interested in seeing therapist, his daughter is going through a rough patch and he is having difficulty dealing with things.  Mother with breast cancer      HOME ENDOCRINE REGIMEN:  Metformin  500 mg XR daily  Rosuvatstain 10 mg daily     Statin: yes ACE-I/ARB: No    METER DOWNLOAD SUMMARY:    DIABETIC COMPLICATIONS: Microvascular complications:   Denies: CKD, neuropathy, retinopathy  Last Eye Exam: Completed 07/23/2022  Macrovascular complications:   Denies: CAD, CVA, PVD   HISTORY:  Past Medical History:  Past Medical History:  Diagnosis Date   Diverticulosis of colon    GERD (gastroesophageal reflux disease)    watches diet   Hemorrhoids, internal    History of adenomatous polyp of colon    History of anal fissures    History of basal cell carcinoma    History of COVID-19 01/2021   per pt mild symptoms that resolved   History of primary hyperparathyroidism 01/2016   endocrinologist--- dr sam;  s/p right superior parathyroidectomy 06/ 2018,  adenoma   Hyperlipidemia    Hypertension    Hypertrophic cardiomyopathy [facility])    cardiologist--- dr jeffrie   IBS (irritable bowel  syndrome)    Inguinal hernia, bilateral    Nocturia    PONV (postoperative nausea and vomiting)    RBBB (right bundle branch block with left anterior fascicular block)    Seasonal allergies    Type 2 diabetes mellitus (HCC)    followed by dr sam (endocrinologist);;  (06-25-2021 pt stated does not check blood sugar at home)   Ulcerative colitis, left  sided [facility])    followed by dr stark (GI)   Umbilical hernia    Wears glasses    Past Surgical History:  Past Surgical History:  Procedure Laterality Date   COLONOSCOPY WITH PROPOFOL   05/27/2020   by dr stark   INGUINAL HERNIA REPAIR Bilateral 06/27/2021   Procedure: LAPAROSCOPIC BILATERAL INGUINAL HERNIA REPAIR;  Surgeon: Sheldon Standing, MD;  Location: [facility] [place];  Service: General;  Laterality: Bilateral;   MASS EXCISION Left 02/12/2017   Procedure: EXCISION 6x6 CM BACK MASS;  Surgeon: Gladis Cough, MD;  Location: [facility];  Service: General;  Laterality: Left;   PARATHYROIDECTOMY Right 08/20/2016   @[facility] ;  right superior   (adenoma)   UMBILICAL HERNIA REPAIR  06/27/2021   Procedure: PRIMARY UMBILICAL HERNIA REPAIR;  Surgeon: Sheldon Standing, MD;  Location: [facility] [place];  Service: General;;   Social History:  reports that he quit smoking about 44 years ago. His smoking use included cigarettes. He started smoking about 49 years ago. He has never used smokeless tobacco. He reports that he does not currently use alcohol. He reports that he does not use drugs. Family History:  Family History  Problem Relation Age of Onset   Hypertension Other    Breast cancer Mother    Heart disease Father    Hypertrophic cardiomyopathy Sister    Colon cancer Neg Hx    Stomach cancer Neg Hx    Cancer Neg Hx        Colon and Prostate   Hyperparathyroidism Neg Hx    Colon polyps Neg Hx    Esophageal cancer Neg Hx    Rectal cancer Neg Hx      HOME MEDICATIONS: Allergies as of 12/27/2023       Reactions   Spironolactone  Other (See Comments)   Breast tenderness, leg spasms        Medication List        Accurate as of December 27, 2023  9:29 AM. If you have any questions, ask your nurse or doctor.          acetaminophen  500 MG tablet Commonly known as: TYLENOL  Take 500 mg by mouth every 6 (six) hours as needed.   amLODipine  10 MG tablet Commonly known as:  NORVASC  TAKE 1 TABLET BY MOUTH EVERY DAY   diclofenac 75 MG EC tablet Commonly known as: VOLTAREN Take 75 mg by mouth daily.   irbesartan  300 MG tablet Commonly known as: AVAPRO  Take 1 tablet (300 mg total) by mouth daily.   Mesalamine  400 MG Cpdr DR capsule Commonly known as: ASACOL  Take 3 capsules (1,200 mg total) by mouth daily.   metFORMIN  500 MG 24 hr tablet Commonly known as: GLUCOPHAGE -XR Take 1 tablet (500 mg total) by mouth daily with breakfast.   metoprolol  succinate 50 MG 24 hr tablet Commonly known as: TOPROL -XL Take 1/2 tablet (25 mg total) by mouth daily. Take with or immediately following a meal.   rosuvastatin  10 MG tablet Commonly known as: Crestor  Take 1 tablet (10 mg total) by mouth daily.   VITAMIN D  PO  Take by mouth daily.         OBJECTIVE:   Vital Signs: BP 134/86 (BP Location: Left Arm, Patient Position: Sitting, Cuff Size: Normal)   Pulse 71   Ht 6' (1.829 m)   Wt 203 lb 3.2 oz (92.2 kg)   SpO2 98%   BMI 27.56 kg/m   Wt Readings from Last 3 Encounters:  12/27/23 203 lb 3.2 oz (92.2 kg)  12/07/23 204 lb (92.5 kg)  06/29/23 202 lb (91.6 kg)     Exam: General: Pt appears well and is in NAD  Lungs: Clear with good BS bilat   Chest : Bilateral gynecomastia has been noted  Heart: RRR   Abdomen:  soft, nontender  Extremities: Trace  pretibial edema.   Neuro: MS is good with appropriate affect, pt is alert and Ox3    DM foot exam: 12/28/2022  The skin of the feet is intact without sores or ulcerations. The pedal pulses are 2+ on right and 2+ on left. The sensation is intact to a screening 5.07, 10 gram monofilament bilaterally    DATA REVIEWED:  Lab Results  Component Value Date   HGBA1C 6.1 (A) 12/27/2023   HGBA1C 6.1 (A) 06/29/2023   HGBA1C 6.6 (H) 04/13/2023     Latest Reference Range & Units 06/04/23 14:13  Sodium 134 - 144 mmol/L 139  Potassium 3.5 - 5.2 mmol/L 4.1  Chloride 96 - 106 mmol/L 99  CO2 20 - 29 mmol/L  24  Glucose 70 - 99 mg/dL 89  BUN 8 - 27 mg/dL 21  Creatinine 9.23 - 8.72 mg/dL 9.14  Calcium  8.6 - 10.2 mg/dL 9.3  BUN/Creatinine Ratio 10 - 24  25 (H)  eGFR >59 mL/min/1.73 91      Latest Reference Range & Units 12/08/22 12:29  Total CHOL/HDL Ratio 0.0 - 5.0 ratio 3.1  Cholesterol, Total 100 - 199 mg/dL 845  HDL Cholesterol >60 mg/dL 50  Triglycerides 0 - 850 mg/dL 85  VLDL Cholesterol Cal 5 - 40 mg/dL 16  LDL Chol Calc (NIH) 0 - 99 mg/dL 88      CT abdomen 05/04/7973 Adrenals/Urinary Tract: Enhancing 15 mm right adrenal nodule measures Hounsfield units of 3 pre contrast administration fifty-five postcontrast administration and 14 on delayed compatible with a benign adrenal adenoma.   Enhancing 12 mm left adrenal nodule measures Hounsfield units of 11 pre contrast administration sixty-six postcontrast administration and 16 on delayed compatible with a benign adrenal adenoma.   No hydronephrosis. Punctate nonobstructive bilateral renal stones. Kidneys demonstrate symmetric enhancement.    In office BG 195 MGs/DL  ASSESSMENT / PLAN / RECOMMENDATIONS:   1) Bilateral adrenal adenoma:  -Saline loading ruled out hyperaldosteronism 08/2022 - 24-hour urinary cortisol catecholamines were normal 01/2023 - Will proceed with renin, aldosterone, potassium - Patient will return for fasting dexamethasone  suppression test  2)Type 2 Diabetes Mellitus, optimally controlled, With out complications - Most recent A1c of 6.1%. Goal A1c <7.0%.     - A1c remains optimal - In office BG elevated, I have encouraged the patient to check fasting glucose at home on average 2-3 times a week.  Is 195 MGs/DL after eating banana as high   MEDICATIONS: Continue  metformin  500 mg XR daily    2) Diabetic complications:  Eye: Does not have known diabetic retinopathy.  Neuro/ Feet: Does not have known diabetic peripheral neuropathy .  Renal: Patient does not have known baseline CKD. He is  intolerant to losartan.  We discussed renal  benefits of ARB and ACE inhibitors   3) Dyslipidemia:  -LDL has improved  - He continues to imperfect adherence to rosuvastatin  due to variable leg cramps   Medication  rosuvastatin  10 mg daily    4) Gynecomastia:   - I suspect this is related to spironolactone  -Given first degree of family history of breast cancer will proceed with diagnostic mammogram   5) Anxiety:  -A referral to psychology has been placed   Follow-up in 6 months  Patient encouraged to establish with primary care services  Signed electronically by: Stefano Redgie Butts, MD  [facility] Endocrinology  [facility] [address] [place] [place], [place] [address] Phone: [phone] FAX: [phone]   CC: Joshua Debby CROME, MD [address] [place] [address] Phone: [phone]  Fax: [phone]  Return to Endocrinology clinic as below: Future Appointments  Date Time Provider Department Center  12/27/2023  9:30 AM Nathalya Wolanski, Donell Redgie, MD [facility] None  02/11/2024  1:15 PM Maccia, Melissa D, RPH-CPP [facility] H&V  04/11/2024 10:00 AM Joshua Debby CROME, MD [facility]

## 2023-12-27 NOTE — Patient Instructions (Addendum)
   Instructions for Dexamethasone Suppression Test   Step 1: Choose a morning when you can come to our lab at 8:00 am for a blood draw.   Step 2: On the night before the blood draw, take one 1 mg tablet of dexamethasone at 11:30 pm.  The timing is VERY important!   Step 3: The next morning, go to the lab for blood work at 8:00 am.  Bonita [place] do not have to be on an empty stomach, but the timing is VERY important!

## 2023-12-29 ENCOUNTER — Ambulatory Visit: Payer: Self-pay | Admitting: Internal Medicine

## 2023-12-29 ENCOUNTER — Other Ambulatory Visit

## 2023-12-29 ENCOUNTER — Other Ambulatory Visit: Payer: Self-pay | Admitting: Internal Medicine

## 2023-12-29 DIAGNOSIS — D3502 Benign neoplasm of left adrenal gland: Secondary | ICD-10-CM | POA: Diagnosis not present

## 2023-12-29 DIAGNOSIS — D3501 Benign neoplasm of right adrenal gland: Secondary | ICD-10-CM | POA: Diagnosis not present

## 2023-12-31 ENCOUNTER — Ambulatory Visit: Payer: Self-pay | Admitting: Internal Medicine

## 2024-01-05 ENCOUNTER — Telehealth: Payer: Self-pay | Admitting: Internal Medicine

## 2024-01-05 ENCOUNTER — Other Ambulatory Visit

## 2024-01-05 ENCOUNTER — Encounter

## 2024-01-05 NOTE — Telephone Encounter (Signed)
 Patient is calling to say that his visit tomorrow for ultrasound/Biopsy needs a prior authorization.  Patient states that he spoke with someone yesterday and they were going to forward information to [facility] in outbound referrals.  Patient states he needs this authorization.

## 2024-01-06 ENCOUNTER — Ambulatory Visit
Admission: RE | Admit: 2024-01-06 | Discharge: 2024-01-06 | Disposition: A | Discharge status: Home / Self Care | Source: Ambulatory Visit | Attending: Internal Medicine | Admitting: Internal Medicine

## 2024-01-06 ENCOUNTER — Ambulatory Visit

## 2024-01-06 ENCOUNTER — Other Ambulatory Visit

## 2024-01-06 DIAGNOSIS — R928 Other abnormal and inconclusive findings on diagnostic imaging of breast: Secondary | ICD-10-CM | POA: Diagnosis not present

## 2024-01-06 DIAGNOSIS — N62 Hypertrophy of breast: Secondary | ICD-10-CM

## 2024-01-06 DIAGNOSIS — R92323 Mammographic fibroglandular density, bilateral breasts: Secondary | ICD-10-CM | POA: Diagnosis not present

## 2024-01-08 LAB — DEXAMETHASONE, BLOOD: Dexamethasone, Serum: 388 ng/dL

## 2024-01-08 LAB — CORTISOL: Cortisol, Plasma: 1.9 ug/dL — ABNORMAL LOW

## 2024-01-10 ENCOUNTER — Encounter: Payer: Self-pay | Admitting: Psychology

## 2024-01-10 ENCOUNTER — Ambulatory Visit: Admitting: Psychology

## 2024-01-10 DIAGNOSIS — F419 Anxiety disorder, unspecified: Secondary | ICD-10-CM | POA: Diagnosis not present

## 2024-01-10 NOTE — Progress Notes (Signed)
 [facility] Behavioral Health Counselor Initial Adult Exam  Name: MYREON WIMER Date: 01/10/2024 MRN: [id number] DOB: 08/27/1945 PCP: Joshua Debby CROME, MD  Time spent: 10:00am-   Pt is seen for in person visit.  Guardian/Payee:  self    Paperwork requested: No   Reason for Visit /Presenting Problem: Pt is referred by his Endocrinologist, Dr. Sam, for anxiety.  Pt reports in past doctor has suggested counseling. Pt reports that noticing increased anxiety, worry for daughter that impacting more.  Pt reports he retired 3 years ago.  Pt reports in past couple years his daughter seems to be struggling more w/ her mental health and in constant contact during day about how she is feeling, complaints and worries. Pt reports that wants to support daughter but also not sure that current response/dynamics is benefiting either of them.  Pt reports other stressors are political environment that worries him, aging and health worries- but feels he is able to manage these and no consuming.    Mental Status Exam: Appearance:   Well Groomed     Behavior:  Appropriate  Motor:  Normal  Speech/Language:   Clear and Coherent and Normal Rate  Affect:  Appropriate  Mood:  anxious  Thought process:  normal  Thought content:    WNL  Sensory/Perceptual disturbances:    WNL  Orientation:  oriented to person, place, time/date, and situation  Attention:  Good  Concentration:  Good  Memory:  WNL  Fund of knowledge:   Good  Insight:    Good  Judgment:   Good  Impulse Control:  Good   Reported Symptoms:  Pt reports increased worry and rumination.  Pt reports difficulty relaxing and some sleep disturbance- more difficulty to fall back asleep at night.  Pt also reports feels more on edge and worries that something bad will happen.  Pt reports just not feeling as happy w/ the worry. Pt still engaged w/ friends and family.  Pt reports concentration effected by worry.  Pt reports has felt anxiety in certain social  situations or when social setting where several people present- cause increased anxiety and more withdrawn.    Risk Assessment: Danger to Self:  No Self-injurious Behavior: No Danger to Others: No Duty to Warn:no Physical Aggression / Violence:No  Access to Firearms a concern: No  Gang Involvement:No  Patient / guardian was educated about steps to take if suicide or homicide risk level increases between visits: n/a While future psychiatric events cannot be accurately predicted, the patient does not currently require acute inpatient psychiatric care and does not currently meet [place]  involuntary commitment criteria.  Substance Abuse History: Current substance abuse: No   Pt reports drinks socially- 1 drink at dinner sometimes.  No increase in use w/ recent stressors.   Past Psychiatric History:   No previous psychological problems have been observed Outpatient Providers:no previous counseling hx History of Psych Hospitalization: No  Psychological Testing: none   Abuse History:  Victim of: No., none   Report needed: No. Victim of Neglect:No. Perpetrator of none  Witness / Exposure to Domestic Violence: No   Protective Services Involvement: No  Witness to [employer] Violence:  No   Family History:  Family History  Problem Relation Age of Onset   Hypertension Other    Breast cancer Mother    Heart disease Father    Hypertrophic cardiomyopathy Sister    Colon cancer Neg Hx    Stomach cancer Neg Hx    Cancer Neg Hx  Colon and Prostate   Hyperparathyroidism Neg Hx    Colon polyps Neg Hx    Esophageal cancer Neg Hx    Rectal cancer Neg Hx   Pt grew up w/ mom and dad in [place], [place] until age 89y/o and then [place] until went to college at [employer].  Pt oldest and has 3 younger sisters.  Pt reports he sees 1 [place] about 2x [place] .  Pt reports not a lot of contact w/ other 2 sisters.  Pt reports positive  relationship w/ parents growing up and was also close w/ maternal grandmother who lived w/ them until he was 6/7y/o.    Living situation: the patient lives with his wife in [place], [place].   Sexual Orientation: Straight  Relationship Status: married in 1978 to his wife.  Pt reports positive relationship.   Name of spouse / other:Linda If a parent, number of children / ages: 2 daughters.  Tinnie is 44y/o married to her partner and they have a 3y/o adopted son together.  They live in [place], [place].  Melissa is 78y/o not married and living in [place].  Pt reports she has been struggling w/ anxiety and depression and very negative outlook for self.    Support Systems: spouse friends  Surveyor, Quantity Stress:  No   Income/Employment/Disability: Retirement income.  Pt retired after 49 years w/ [employer].  Pt left as VP or operations.  Pt enjoyed his job and connections at work.  Some adjustment to retirement and feels that adjusted well. Pt considering volunteer work that will fit him.   Military Service: No   Educational History: Education: engineer, maintenance (it) in [place] from [employer]: Not religious or part of a community  Any cultural differences that may affect / interfere with treatment:  not applicable   Recreation/Hobbies:  Pt enjoys the [employer], pt enjoys walking. Pt works out 2-3 days a week.  Pt goes to lunch 1x a week w/ friends. Pt has been sports fan- [employer].  Enjoys reading news.    Stressors: Other: worries for his daughter    Strengths: Supportive Relationships and Able to Communicate Effectively  Barriers:  none reported   Legal History: Pending legal issue / charges: The patient has no significant history of legal issues. History of legal issue / charges: none  Medical History/Surgical History: reviewed Past Medical History:  Diagnosis Date   Diverticulosis of colon    GERD (gastroesophageal reflux disease)     watches diet   Hemorrhoids, internal    History of adenomatous polyp of colon    History of anal fissures    History of basal cell carcinoma    History of COVID-19 01/2021   per pt mild symptoms that resolved   History of primary hyperparathyroidism 01/2016   endocrinologist--- dr sam;  s/p right superior parathyroidectomy 06/ 2018,  adenoma   Hyperlipidemia    Hypertension    Hypertrophic cardiomyopathy [facility])    cardiologist--- dr jeffrie   IBS (irritable bowel syndrome)    Inguinal hernia, bilateral    Nocturia    PONV (postoperative nausea and vomiting)    RBBB (right bundle branch block with left anterior fascicular block)    Seasonal allergies    Type 2 diabetes mellitus (HCC)    followed by dr sam (endocrinologist);;  (06-25-2021 pt stated does not check blood sugar at home)   Ulcerative colitis, left sided (HCC)  followed by dr stark (GI)   Umbilical hernia    Wears glasses     Past Surgical History:  Procedure Laterality Date   COLONOSCOPY WITH PROPOFOL   05/27/2020   by dr stark   INGUINAL HERNIA REPAIR Bilateral 06/27/2021   Procedure: LAPAROSCOPIC BILATERAL INGUINAL HERNIA REPAIR;  Surgeon: Sheldon Standing, MD;  Location: [facility] ;  Service: General;  Laterality: Bilateral;   MASS EXCISION Left 02/12/2017   Procedure: EXCISION 6x6 CM BACK MASS;  Surgeon: Gladis Cough, MD;  Location: [facility];  Service: General;  Laterality: Left;   PARATHYROIDECTOMY Right 08/20/2016   @[facility] ;  right superior   (adenoma)   UMBILICAL HERNIA REPAIR  06/27/2021   Procedure: PRIMARY UMBILICAL HERNIA REPAIR;  Surgeon: Sheldon Standing, MD;  Location: [facility];  Service: General;;    Medications: Current Outpatient Medications  Medication Sig Dispense Refill   acetaminophen  (TYLENOL ) 500 MG tablet Take 500 mg by mouth every 6 (six) hours as needed.     amLODipine  (NORVASC ) 10 MG tablet TAKE 1 TABLET BY MOUTH EVERY DAY 90 tablet 2    diclofenac (VOLTAREN) 75 MG EC tablet Take 75 mg by mouth daily.     irbesartan  (AVAPRO ) 300 MG tablet Take 1 tablet (300 mg total) by mouth daily. 90 tablet 3   Mesalamine  (ASACOL ) 400 MG CPDR DR capsule Take 3 capsules (1,200 mg total) by mouth daily. 270 capsule 3   metFORMIN  (GLUCOPHAGE -XR) 500 MG 24 hr tablet Take 1 tablet (500 mg total) by mouth daily with breakfast. 90 tablet 3   metoprolol  succinate (TOPROL -XL) 50 MG 24 hr tablet Take 1/2 tablet (25 mg total) by mouth daily. Take with or immediately following a meal. 90 tablet 3   rosuvastatin  (CRESTOR ) 10 MG tablet Take 1 tablet (10 mg total) by mouth daily. 90 tablet 3   VITAMIN D  PO Take by mouth daily.     No current facility-administered medications for this visit.    Allergies  Allergen Reactions   Spironolactone  Other (See Comments)    Breast tenderness, leg spasms    Diagnoses:  Anxiety disorder, unspecified type  Plan of Care: Pt is a 77y/o man seeking counseling for anxiety.  Pt reports referred by his endocrinologist for increasing anxiety/worry about his daughter and her wellbeing.  His daughter is tx for anxiety and depression and has been seeking out support/reassurance throughout the day for worries and complaints.  Pt feels her negative mindset not helping her or him and wants to be support but not sure supporting.  Pt seeking guidance for coping w/ stressors and reducing anxiety.  Pt to f/u w/ biweekly counseling and to continue as scheduled w/ his PCP and endocrinologist.      BARBARANN APPL, [facility]

## 2024-01-10 NOTE — Progress Notes (Signed)
 Behavioral Health Treatment Plan   Name:Patrick Floyd   MRN: [id number]   Treatment Plan Development Date: 01/09/25   Strengths: Supportive Relationships and Enjoys [employer] puzzles, lunch w/ friends weekly, working out 2x a week, walks, time w/ wife.   Supports: Spouse and Friends   Theatre Manager of Needs: to cope and work on stress w/ things w/ my daughter, feel less anxious/worried about and feel less anxious in certain social situations.   Treatment Level:outpatient counseling  Client Treatment Preferences:biweekly in person counseling.   Diagnosis: Anxiety  Anxiety d/o unspecified  Symptoms:  Difficulty managing worry, Restlessness or feeling keyed up or on edge, and Sleep disturbance (Difficulty falling or staying asleep, restlessness, or unsatisfying sleep  Goals:  Build and employ tools and skills to reduce symptoms of anxiety, worry, and improve functioning day-to-day.  Objectives: Target Date For All Objectives: 01/09/25  Develop coping tools to manage anxiety including mindfulness, acceptance, relaxation, reframing, and challenging negative thoughts and feelings., Develop consistent self-care routine including exercise, healthful eating, consistent sleep., and Develop healthy boundaries with daughter.  Progress Documentation:  Progressing  Interventions:  CBT - reframing, challenging, cognitive restructuring, ACT, Relaxation and mindfulness, and Assertiveness   and Tx Plan for Social Anxiety  Individualized Treatment Plan: 01/10/2024  Problem: Social Anxiety  Anxiety D/O unspecified  Symptoms:  Withdrawn and increased anxiety in certain social situations  Goals:  Increase engagement in a variety of social interactions.  Objectives: Target Date For All Objectives: 01/09/25  Learn and implement social skills to reduce anxiety and build confidence in social interactions  Progress  Documentation:  Progressing  Interventions:  CBT - reframing, challenging, cognitive restructuring, Relaxation and mindfulness, and Communication and conflict resolution skills  The patient and clinician reviewed the treatment plan on 01/10/2024. The patient provided verbal consent for the treatment plan and the consent for with electronic signature is on file in the patient's electronic medical record.   Expected duration of treatment: reevaluate after 1 year  Party responsible for implementation of interventions: Pt and counselor   This plan has been reviewed and created by the following participants: pt and counselor   This plan will be reviewed at least every 12 months.   Yes, please see patient chart for sign off.   Tailynn Armetta, [facility]                [place], LCMHC

## 2024-01-16 LAB — ALDOSTERONE + RENIN ACTIVITY W/ RATIO
ALDO / PRA Ratio: 50 ratio — ABNORMAL HIGH (ref 0.9–28.9)
Aldosterone: 12 ng/dL
Renin Activity: 0.24 ng/mL/h — ABNORMAL LOW (ref 0.25–5.82)

## 2024-01-16 LAB — POTASSIUM: Potassium: 3.7 mmol/L (ref 3.5–5.3)

## 2024-01-18 NOTE — Telephone Encounter (Signed)
 I have contacted the patient on 01/18/2024 at 12:50 PM to discuss abnormal renin and aldosterone/PRA renin ratio   I have recommended adrenal venous sampling   Patient would like to have a face-to-face discussion   Will send a message to front desk to schedule

## 2024-01-19 ENCOUNTER — Encounter: Payer: Self-pay | Admitting: Internal Medicine

## 2024-01-20 NOTE — Progress Notes (Signed)
 Name: Patrick Floyd  Age/ Sex: 78 y.o., male   MRN/ DOB: [id number], [birth year]     PCP: Patrick Debby CROME, MD   Reason for Endocrinology Evaluation: Type 2 Diabetes Mellitus  Initial Endocrine Consultative Visit: 03/23/2016    PATIENT IDENTIFIER: [name] is a 78 y.o. male with a past medical history of T2Dm, HTN , UC, ocular migraines and dyslipidemia . The patient has followed with Endocrinology clinic since 03/23/2016 for consultative assistance with management of his diabetes.  DIABETIC HISTORY:  [name] was diagnosed with DM in 2014, he has been on metformin  since his diagnosis.  His hemoglobin A1c has ranged from 6.1% in 2023, peaking at 8.6% in 2019.  HYPERPARATHYROID HISTORY:  He was diagnosed with hyperparathyroidism in 2010.  He is status post parathyroidectomy in 2018  Last DXA 2018-low bone density    ADRENAL HISTORY: He had an abnormal renin with elevated Aldo/renin ratio 04/2022, which was checked during evaluation of HTN 180/88  mmhg, he was not on ARB at the time due to dizziness   He was initially reluctant with invasive testing  We opted to proceed with adrenal imaging prior to confirmation His renin normalized on olmesartan     Adrenal imaging 08/2022 revealed 15 mm right adrenal adenoma, 12 mm left adrenal adenoma   Saline loading ruled out hyperaldosteronism with postinfusion aldosterone of 4.8 NG/DL 3/71/7975  Patient was started on spironolactone  per cardiology in 2025 due to intolerance to multiple antihypertensive agents and BP remained elevated   Spironolactone  discontinued due to breast tenderness September, 2025   Mother with breast cancer    Repeat Aldo/renin was abnormal with suppressed renin and elevated Aldo/renin ratio in the setting of irbesartan  intake, which confirms the presence of hyperaldosteronism     SUBJECTIVE:   During the last visit (12/27/2023): A1c 6.1%    Today (01/21/2024): [name] is here for  adrenal adenoma management.   He continues to follow-up with cardiology for hypertrophic cardiomyopathy and HTN.   He is here to discuss suppressed renin and elevated Aldo/PRA renin ratio and the need for AVS  He is accompanied by his spouse Patrick Floyd today His BP is elevated during this visit, patient did not take his morning antihypertensive meds this morning  He didn't take BP meds today  Has noted occasional headaches   No LE legs  No SOB  No chest pain    HOME ENDOCRINE REGIMEN:  Metformin  500 mg XR daily  Rosuvatstain 10 mg daily     Statin: yes ACE-I/ARB: No    METER DOWNLOAD SUMMARY:    DIABETIC COMPLICATIONS: Microvascular complications:   Denies: CKD, neuropathy, retinopathy  Last Eye Exam: Completed 07/23/2022  Macrovascular complications:   Denies: CAD, CVA, PVD   HISTORY:  Past Medical History:  Past Medical History:  Diagnosis Date   Diverticulosis of colon    GERD (gastroesophageal reflux disease)    watches diet   Hemorrhoids, internal    History of adenomatous polyp of colon    History of anal fissures    History of basal cell carcinoma    History of COVID-19 01/2021   per pt mild symptoms that resolved   History of primary hyperparathyroidism 01/2016   endocrinologist--- Patrick Patrick Floyd;  s/p right superior parathyroidectomy 06/ 2018,  adenoma   Hyperlipidemia    Hypertension    Hypertrophic cardiomyopathy [facility])    cardiologist--- Patrick Floyd   IBS (irritable bowel syndrome)    Inguinal hernia, bilateral  Nocturia    PONV (postoperative nausea and vomiting)    RBBB (right bundle branch block with left anterior fascicular block)    Seasonal allergies    Type 2 diabetes mellitus (HCC)    followed by Patrick Patrick Floyd (endocrinologist);;  (06-25-2021 pt stated does not check blood sugar at home)   Ulcerative colitis, left sided [facility])    followed by Patrick Floyd (GI)   Umbilical hernia    Wears glasses    Past Surgical History:  Past Surgical  History:  Procedure Laterality Date   COLONOSCOPY WITH PROPOFOL   05/27/2020   by Patrick Floyd   INGUINAL HERNIA REPAIR Bilateral 06/27/2021   Procedure: LAPAROSCOPIC BILATERAL INGUINAL HERNIA REPAIR;  Surgeon: Patrick Standing, MD;  Location: [facility];  Service: General;  Laterality: Bilateral;   MASS EXCISION Left 02/12/2017   Procedure: EXCISION 6x6 CM BACK MASS;  Surgeon: Patrick Cough, MD;  Location: [facility];  Service: General;  Laterality: Left;   PARATHYROIDECTOMY Right 08/20/2016   @[facility] ;  right superior   (adenoma)   UMBILICAL HERNIA REPAIR  06/27/2021   Procedure: PRIMARY UMBILICAL HERNIA REPAIR;  Surgeon: Patrick Standing, MD;  Location: [facility];  Service: General;;   Social History:  reports that he quit smoking about 44 years ago. His smoking use included cigarettes. He started smoking about 49 years ago. He has never used smokeless tobacco. He reports that he does not currently use alcohol. He reports that he does not use drugs. Family History:  Family History  Problem Relation Age of Onset   Hypertension Other    Breast cancer Mother    Heart disease Father    Hypertrophic cardiomyopathy Sister    Colon cancer Neg Hx    Stomach cancer Neg Hx    Cancer Neg Hx        Colon and Prostate   Hyperparathyroidism Neg Hx    Colon polyps Neg Hx    Esophageal cancer Neg Hx    Rectal cancer Neg Hx      HOME MEDICATIONS: Allergies as of 01/21/2024       Reactions   Spironolactone  Other (See Comments)   Breast tenderness, leg spasms        Medication List        Accurate as of January 21, 2024  8:06 AM. If you have any questions, ask your nurse or doctor.          acetaminophen  500 MG tablet Commonly known as: TYLENOL  Take 500 mg by mouth every 6 (six) hours as needed.   amLODipine  10 MG tablet Commonly known as: NORVASC  TAKE 1 TABLET BY MOUTH EVERY DAY   diclofenac 75 MG EC tablet Commonly known as: VOLTAREN Take 75 mg by mouth  daily.   irbesartan  300 MG tablet Commonly known as: AVAPRO  Take 1 tablet (300 mg total) by mouth daily.   Mesalamine  400 MG Cpdr Patrick capsule Commonly known as: ASACOL  Take 3 capsules (1,200 mg total) by mouth daily.   metFORMIN  500 MG 24 hr tablet Commonly known as: GLUCOPHAGE -XR Take 1 tablet (500 mg total) by mouth daily with breakfast.   metoprolol  succinate 50 MG 24 hr tablet Commonly known as: TOPROL -XL Take 1/2 tablet (25 mg total) by mouth daily. Take with or immediately following a meal.   rosuvastatin  10 MG tablet Commonly known as: Crestor  Take 1 tablet (10 mg total) by mouth daily.   VITAMIN D  PO Take by mouth daily.  OBJECTIVE:   Vital Signs: BP (!) 162/88   Ht 6' (1.829 m)   Wt 207 lb (93.9 kg)   BMI 28.07 kg/m   Wt Readings from Last 3 Encounters:  01/21/24 207 lb (93.9 kg)  12/27/23 203 lb 3.2 oz (92.2 kg)  12/07/23 204 lb (92.5 kg)     Exam: General: Pt appears well and is in NAD  Neuro: MS is good with appropriate affect, pt is alert and Ox3    DM foot exam: 12/28/2022  The skin of the feet is intact without sores or ulcerations. The pedal pulses are 2+ on right and 2+ on left. The sensation is intact to a screening 5.07, 10 gram monofilament bilaterally    DATA REVIEWED:  Lab Results  Component Value Date   HGBA1C 6.1 (A) 12/27/2023   HGBA1C 6.1 (A) 06/29/2023   HGBA1C 6.6 (H) 04/13/2023    Latest Reference Range & Units 12/27/23 10:05  Potassium 3.5 - 5.3 mmol/L 3.7     Latest Reference Range & Units 12/27/23 10:05 12/29/23 08:14  ALDOSTERONE  ng/dL 12   ALDO / PRA Ratio 0.9 - 28.9 Ratio 50.0 (H)   Cortisol, Plasma mcg/dL  1.9 (L)    Latest Reference Range & Units 12/27/23 10:05  Renin Activity 0.25 - 5.82 ng/mL/h 0.24 (L)      CT abdomen 08/04/2022 Adrenals/Urinary Tract: Enhancing 15 mm right adrenal nodule measures Hounsfield units of 3 pre contrast administration fifty-five postcontrast administration and  14 on delayed compatible with a benign adrenal adenoma.   Enhancing 12 mm left adrenal nodule measures Hounsfield units of 11 pre contrast administration sixty-six postcontrast administration and 16 on delayed compatible with a benign adrenal adenoma.   No hydronephrosis. Punctate nonobstructive bilateral renal stones. Kidneys demonstrate symmetric enhancement.  MRI Abdomen 01/24/2024  Lower chest: Unremarkable MR appearance to the lung bases. No pleural effusion. No pericardial effusion. Normal heart size. Note is made of moderately elevated right hemidiaphragm.   Hepatobiliary: The liver is normal in size and configuration. No intrahepatic or extrahepatic bile duct dilatation. No choledocholithiasis. Unremarkable gallbladder.   Pancreas: Normal T1 hyperintense signal of the pancreas. There are several, adjacent T2 hyperintense nonenhancing lesions in the pancreatic tail with largest measuring up to 4 x 7 mm (series 4, images 20-21 and series 3, images 23-26). Exact communication of the lesion with main pancreatic duct or side-branch is not visualized on this exam however, these are favored to represent pancreatic side-branch IPMN. No peripancreatic fat stranding. Main pancreatic duct is nondilated.   Spleen:  Within normal limits in size and appearance. No focal mass.   Adrenals/Urinary Tract: There is stable appearance of bilateral adrenal glands. There is diffuse irregular thickening of the right adrenal gland without discrete nodule, which is essentially unchanged since the prior CT scan. There is a 1.1 x 1.5 cm left adrenal adenoma which is also unchanged. No hydroureteronephrosis. There are several sinus cysts in bilateral kidneys. No suspicious renal lesion.   Stomach/Bowel: Visualized portions within the abdomen are unremarkable. No disproportionate dilation of bowel loops. There are scattered colonic diverticula without diverticulitis.   Vascular/Lymphatic: No  pathologically enlarged lymph nodes identified. No abdominal aortic aneurysm demonstrated. No ascites.   Other:  None.   Musculoskeletal: No suspicious bone lesions identified. There are multiple hemangiomas in the lower thoracic/lumbar spine.   IMPRESSION: 1. There is diffuse irregular thickening of the right adrenal gland without discrete nodule, which is essentially unchanged since the prior CT scan. There is  a 1.1 x 1.5 cm left adrenal adenoma which is also unchanged. 2. There are several, adjacent T2 hyperintense nonenhancing lesions in the pancreatic tail with largest measuring up to 4 x 7 mm. Exact communication of the lesion with main pancreatic duct or side-branch is not visualized on this exam however, these are favored to represent pancreatic side-branch IPMN. Follow-up examination is recommended in 2 year swith contrast-enhanced MRI abdomen as per pancreatic mass protocol. 3. Multiple other nonacute observations, as described above.   ASSESSMENT / PLAN / RECOMMENDATIONS:   1) Primary hyperaldosteronism:  -BP elevated today, he did not take his BP meds for this morning, will go home and take them, asymptomatic - Patient's renin was suppressed with an elevated Aldo/PRA renin ratio, in the setting of irbesartan  intake, this confirms the presence of hyperaldosteronism - We have discussed the second step is to proceed with localization with adrenal venous sampling, I briefly discussed the procedure to my best knowledge - We also discussed the option to proceed with medical management.  Patient developed gynecomastia with spironolactone , so his only option will be eplerenone - Patient will consider these 2 options and will contact me with an update   2) Bilateral adrenal adenoma:  -The patient did have a suppressed renin with elevated Aldo/renin ratio in 2024, he was not on an ARB at the time and required confirmation . Saline loading ruled out hyperaldosteronism in  08/2022 - 24-hour urinary cortisol catecholamines were normal 01/2023 - Dexamethasone  suppression test was acceptable with a cortisol level 1.9 mcg/dL in 89/7974  3) Pancreatic cysts:  - This was an incidental finding on abdominal MRI.  Since this is beyond the scope of endocrinology, a referral to GI has been placed.  The patient has been seen by Patrick. Avram in the past   Signed electronically by: Patrick Redgie Butts, MD  [facility] Endocrinology  [facility] [address] [place] [place], [place] [address] Phone: [phone] FAX: [phone]   CC: Patrick Debby CROME, MD [address] [place] [address] Phone: [phone]  Fax: [phone]  Return to Endocrinology clinic as below: Future Appointments  Date Time Provider Department Center  01/21/2024  8:10 AM Alliyah Roesler, Donell Redgie, MD [facility] None  01/24/2024 10:00 AM Barbarann Damien HERO, [facility] None  01/24/2024  3:20 PM GI-315 MR 3 GI-315MRI GI-[facility] [address] [facility]  02/11/2024  1:15 PM Geraldine Setter D, RPH-CPP [facility] H&V  04/11/2024 10:00 AM Patrick Debby CROME, MD [facility]

## 2024-01-21 ENCOUNTER — Ambulatory Visit (INDEPENDENT_AMBULATORY_CARE_PROVIDER_SITE_OTHER): Admitting: Internal Medicine

## 2024-01-21 VITALS — BP 162/80 | Ht 72.0 in | Wt 207.0 lb

## 2024-01-21 DIAGNOSIS — E2609 Other primary hyperaldosteronism: Secondary | ICD-10-CM | POA: Insufficient documentation

## 2024-01-21 DIAGNOSIS — K862 Cyst of pancreas: Secondary | ICD-10-CM

## 2024-01-21 NOTE — Patient Instructions (Addendum)
 Treatment options for primary hyperaldosteronism include surgical management which typically starts with adrenal venous sampling, or medical management and in this case will be eplerenone

## 2024-01-24 ENCOUNTER — Ambulatory Visit (INDEPENDENT_AMBULATORY_CARE_PROVIDER_SITE_OTHER): Admitting: Psychology

## 2024-01-24 ENCOUNTER — Ambulatory Visit
Admission: RE | Admit: 2024-01-24 | Discharge: 2024-01-24 | Disposition: A | Source: Ambulatory Visit | Attending: Internal Medicine | Admitting: Internal Medicine

## 2024-01-24 DIAGNOSIS — K5730 Diverticulosis of large intestine without perforation or abscess without bleeding: Secondary | ICD-10-CM | POA: Diagnosis not present

## 2024-01-24 DIAGNOSIS — D3502 Benign neoplasm of left adrenal gland: Secondary | ICD-10-CM | POA: Diagnosis not present

## 2024-01-24 DIAGNOSIS — N281 Cyst of kidney, acquired: Secondary | ICD-10-CM | POA: Diagnosis not present

## 2024-01-24 DIAGNOSIS — F419 Anxiety disorder, unspecified: Secondary | ICD-10-CM

## 2024-01-24 DIAGNOSIS — N62 Hypertrophy of breast: Secondary | ICD-10-CM

## 2024-01-24 MED ORDER — GADOPICLENOL 0.5 MMOL/ML IV SOLN
10.0000 mL | Freq: Once | INTRAVENOUS | Status: AC | PRN
  Administered 2024-01-24: 10 mL via INTRAVENOUS

## 2024-01-24 NOTE — Progress Notes (Signed)
 [facility] Counselor/Therapist Progress Note  Patient ID: Patrick Floyd, MRN: [id number],    Date: 01/24/2024  Time Spent: 10:10am-11:00am  Pt is seen for in person visit.   Treatment Type: Individual Therapy  Reported Symptoms: anxiety, ruminating on worries  Mental Status Exam: Appearance:  Well Groomed     Behavior: Appropriate  Motor: Normal  Speech/Language:  Clear and Coherent and Normal Rate  Affect: Appropriate  Mood: anxious  Thought process: normal  Thought content:   WNL  Sensory/Perceptual disturbances:   WNL  Orientation: oriented to person, place, time/date, and situation  Attention: Good  Concentration: Good  Memory: WNL  Fund of knowledge:  Good  Insight:   Good  Judgment:  Good  Impulse Control: Good   Risk Assessment: Danger to Self:  No Self-injurious Behavior: No Danger to Others: No Duty to Warn:no Physical Aggression / Violence:No  Access to Firearms a concern: No  Gang Involvement:No   Subjective: Counselor assessed pt current functioning per pt report.  Processed w/pt stressors and worry for daughter.  Provided psychoeducation re: anxiety and anxious thoughts.  Discussed acknowledging, naming and ways to shift and reframe.  Explored ways pt currently managing and shift from rumination.  Encouraged mindful approach and ways of challenging unrealistic expectation.  Pt affect wnl. Pt reports that continued w/ worry for his daughter, stress and concern w/ her wellbeing.  Pt reflects on how ruminates on and impacting interactions w/ wife and his own wellbeing.  Pt discussed what values and how he wants to show up as a parent for his adult children.  Pt recognized pattern of worried/anxious thoughts and distortions.  Pt acknowledged that should be able to fix as unrealistic expectations.  Pt identified ways to reframe.  Pt discussed exercise, walks as helpful.  Pt receptive to building mindful practices that may assist.   Interventions:  Cognitive Behavioral Therapy, Mindfulness Meditation, and supportive  Diagnosis:Anxiety disorder, unspecified type  Plan: Pt to f/u w/ counseling in 3 weeks. Pt to f/u as scheduled w/ PCP.   BARBARANN APPL, LCMHC

## 2024-01-24 NOTE — Progress Notes (Signed)
        Behavioral Health Treatment Plan   Name:Patrick Floyd   MRN: [id number]   Treatment Plan Development Date: 01/09/25   Strengths: Supportive Relationships and Enjoys [employer] puzzles, lunch w/ friends weekly, working out 2x a week, walks, time w/ wife.   Supports: Spouse and Friends   Theatre Manager of Needs: to cope and work on stress w/ things w/ my daughter, feel less anxious/worried about and feel less anxious in certain social situations.   Treatment Level:outpatient counseling  Client Treatment Preferences:biweekly in person counseling.   Diagnosis: Anxiety  Anxiety d/o unspecified  Symptoms:  Difficulty managing worry, Restlessness or feeling keyed up or on edge, and Sleep disturbance (Difficulty falling or staying asleep, restlessness, or unsatisfying sleep  Goals:  Build and employ tools and skills to reduce symptoms of anxiety, worry, and improve functioning day-to-day.  Objectives: Target Date For All Objectives: 01/09/25  Develop coping tools to manage anxiety including mindfulness, acceptance, relaxation, reframing, and challenging negative thoughts and feelings., Develop consistent self-care routine including exercise, healthful eating, consistent sleep., and Develop healthy boundaries with daughter.  Progress Documentation:  Progressing  Interventions:  CBT - reframing, challenging, cognitive restructuring, ACT, Relaxation and mindfulness, and Assertiveness   and Tx Plan for Social Anxiety  Individualized Treatment Plan: 01/24/2024  Problem: Social Anxiety  Anxiety D/O unspecified  Symptoms:  Withdrawn and increased anxiety in certain social situations  Goals:  Increase engagement in a variety of social interactions.  Objectives: Target Date For All Objectives: 01/09/25  Learn and implement social skills to reduce anxiety and build confidence in social interactions  Progress  Documentation:  Progressing  Interventions:  CBT - reframing, challenging, cognitive restructuring, Relaxation and mindfulness, and Communication and conflict resolution skills  The patient and clinician reviewed the treatment plan on 01/24/2024. The patient provided verbal consent for the treatment plan and the consent for with electronic signature is on file in the patient's electronic medical record.   Expected duration of treatment: reevaluate after 1 year  Party responsible for implementation of interventions: Pt and counselor   This plan has been reviewed and created by the following participants: pt and counselor   This plan will be reviewed at least every 12 months.   Yes, please see patient chart for sign off.   Patrick Floyd, [facility]            [place], LCMHC

## 2024-01-26 NOTE — Addendum Note (Signed)
 Addended by: SAM DONELL PARAS on: 01/26/2024 09:16 AM   Modules accepted: Orders

## 2024-01-31 ENCOUNTER — Encounter: Payer: Self-pay | Admitting: Cardiology

## 2024-01-31 ENCOUNTER — Encounter: Payer: Self-pay | Admitting: Pharmacist

## 2024-02-03 ENCOUNTER — Telehealth: Payer: Self-pay | Admitting: Internal Medicine

## 2024-02-03 ENCOUNTER — Other Ambulatory Visit (HOSPITAL_COMMUNITY): Payer: Self-pay

## 2024-02-03 DIAGNOSIS — E2609 Other primary hyperaldosteronism: Secondary | ICD-10-CM

## 2024-02-03 MED ORDER — EPLERENONE 25 MG PO TABS
25.0000 mg | ORAL_TABLET | Freq: Every day | ORAL | 6 refills | Status: AC
  Filled 2024-02-03: qty 30, 30d supply, fill #0
  Filled 2024-02-27: qty 30, 30d supply, fill #1
  Filled 2024-03-29: qty 30, 30d supply, fill #2

## 2024-02-03 NOTE — Telephone Encounter (Signed)
 Please contact the patient and schedule him for a lab appointment only in 4 weeks.   He is also supposed to schedule an office visit with me in 3 months as there are no future visits for him    Thanks

## 2024-02-11 ENCOUNTER — Ambulatory Visit: Attending: Cardiology | Admitting: Pharmacist

## 2024-02-11 VITALS — BP 170/86 | HR 77

## 2024-02-11 DIAGNOSIS — I10 Essential (primary) hypertension: Secondary | ICD-10-CM | POA: Diagnosis not present

## 2024-02-11 MED ORDER — METOPROLOL SUCCINATE ER 50 MG PO TB24: 50.0000 mg | ORAL_TABLET | Freq: Every day | ORAL | 3 refills | Status: AC

## 2024-02-11 NOTE — Patient Instructions (Addendum)
 Your blood pressure goal is < 130/3mmHg   Please continue amlodipine  10 mg daily (AM), irbesartan  300 mg daily, metoprolol  succinate 50mg  daily, eplerenone  25mg  daily  Please send me some blood pressure readings in Jan after your blood work.   Important lifestyle changes to control high blood pressure  Intervention  Effect on the BP   Weight loss Weight loss is one of the most effective lifestyle changes for controlling blood pressure. If you're overweight or obese, losing even a small amount of weight can help reduce blood pressure.    Blood pressure can decrease by 1 millimeter of mercury (mmHg) with each kilogram (about 2.2 pounds) of weight lost.   Exercise regularly As a general goal, aim for 30 minutes of moderate physical activity every day.    Regular physical activity can lower blood pressure by 5 - 8 mmHg.   Eat a healthy diet Eat a diet rich in whole grains, fruits, vegetables, lean meat, and low-fat dairy products. Limit processed foods, saturated fat, and sweets.    A heart-healthy diet can lower high blood pressure by 10 mmHg.   Reduce salt (sodium) in your diet Aim for 000mg  of sodium each day. Avoid deli meats, canned food, and frozen microwave meals which are high in sodium.     Limiting sodium can reduce blood pressure by 5 mmHg.   Limit alcohol One drink equals 12 ounces of beer, 5 ounces of wine, or 1.5 ounces of 80-proof liquor.    Limiting alcohol to < 1 drink a day for women or < 2 drinks a day for men can help lower blood pressure by about 4 mmHg.   To check your pressure at home you will need to:   Sit up in a chair, with feet flat on the floor and back supported. Do not cross your ankles or legs. Rest your left arm so that the cuff is about heart level. If the cuff goes on your upper arm, then just relax your arm on the table, arm of the chair, or your lap. If you have a wrist cuff, hold your wrist against your chest at heart level. Place the  cuff snugly around your arm, about 1 inch above the crease of your elbow. The cords should be inside the groove of your elbow.  Sit quietly, with the cuff in place, for about 5 minutes. Then press the power button to start a reading. Do not talk or move while the reading is taking place.  Record your readings on a sheet of paper. Although most cuffs have a memory, it is often easier to see a pattern developing when the numbers are all in front of you.  You can repeat the reading after 1-3 minutes if it is recommended.   Make sure your bladder is empty and you have not had caffeine or tobacco within the last 30 minutes   Always bring your blood pressure log with you to your appointments. If you have not brought your monitor in to be double checked for accuracy, please bring it to your next appointment.   You can find a list of validated (accurate) blood pressure cuffs at: validatebp.org

## 2024-02-11 NOTE — Assessment & Plan Note (Signed)
 Assessment: New diagnosis of hyperaldosteronism, now on eplerenone  25 mg daily Patient has been tweaking some of his doses which may make it clear his affect a little clouded He would like to increase his metoprolol  back to 50 mg daily-I do not see any issue with this as he has plenty of heart rate room.  It was only decreased to see if it was contributing to his fatigue. Blood pressures at home have been higher since starting eplerenone   Plan: Has only been on eplerenone  for 1 week, we will plan to continue to monitor before any adjustments are made Encouraged patient not to cut irbesartan  in half but to take the full 300 mg Okay to increase metoprolol  succinate to 50 mg daily Once he has his repeat labs in January he will send me some updated blood pressure readings.  At that point if blood pressure still not well-controlled we will discuss with endocrinology about increasing eplerenone 

## 2024-02-11 NOTE — Progress Notes (Signed)
 Patient ID: Patrick Floyd                 DOB: 01-Mar-1946                      MRN: [id number]      HPI: Patrick Floyd is a 78 y.o. male referred by Dr. Jeffrie to HTN clinic. PMH is significant for hypertrophic cardiomyopathy, T2DM, ulcerative colitis, hyperlipidemia, hyperparathyroidism s/p parathyroidectomy. Hyperaldosteronism was initially ruled out w/ saline loading. Adrenal imaging 08/2022 revealed 15 mm right adrenal adenoma, 12 mm left adrenal adenoma.  Back in Feb 2024, patient was started on omlesartan and indapamide  by PCP but patient stopped taking stating they made him tired. Olmesartan  10mg  daily was resumed, which was later increased to 20mg  daily. He was also started on rosuvastatin  10mg  which he reported doing well on. Renal function and K stable on ARB.   Seen 4/25, no changes were made at appointment, but after his labs resulted and I discussed with Dr. Sam, olmesartan  was increased to 40mg  daily. He contacted me via mychart starting that he had a headache, felt a little dizzy with exercise and nauseous. We decided to wait a little longer to see if symptoms would go away.    Seen 5/16, blood pressure was 128/68. No medication changes were made. Patient contacted me via mychart 6/25 requesting to decrease omlesartan due to tiredness, nausea dizziness and weakness that he contributes to olmesartan .   Seen 7/16, patient refused medication changes. Increased physical activity/intensity was encouraged. On 8/27 metoprolol  was changed to carvedilol .  Olmesartan  and carvedilol  stopped at appointment with Dr. Jeffrie 10/8. Nebivolol  10 mg daily started.  Seen 03/18/23, patient had stopped nebivolol  (felt like it was affecting his balance and leg strength) and had gone back to metoprolol . I stopped his metoprolol  thinking maybe it was contributing to his fatigue. Irbesartan  75mg  was started. Via mychart, it was increased to 150mg  daily.   Patient messaged me stating he had an  episode where he had some palpitations and overall did not feel well after cleaning out a closet.  He sat down and felt better after about 20 minutes.  Also reported some palpitations in the evening. At his appointment 03/18/23, a few days later, metoprolol  was resumed and his irbesartan  was increased back to 150mg  daily.   Last seen 05/05/23 in clinic. Reported feeling well. He believed his symptoms have improved with resumption of metoprolol . Discussed his lab work his PCP did and his microalbuminuria, UACR 107.8. Discussed a few medications his PCP could be considering for his CKD including SGLT2 or Kerendia. Home BP averaging about 141/78. Heart rate mainly mid 60s. Increased irbesartan  to 300 mg daily.   4/4 BMP WNL, appears a little dehydrated with BUN/Cr > 20. Patient message on 4/7 indicated BP mainly in low 130s-low 140s/mid 70s-80 which is an improvement from previous.  At visit 4/11 spironolactone  12.5mg  daily was added.  Follow-up BMP within normal limits. At visit 6/12 spironolactone  was increased to 25mg  daily. We discussed managing stress and hip pain. At his last visit 10/04/23, spironolactone  was stopped due to breast tenderness.  Repeat Aldo/renin was abnormal with suppressed renin and elevated Aldo/renin ratio in the setting of irbesartan  intake, which confirms the presence of hyperaldosteronism. It was decided to do a trial of eplerenone  25mg  daily. Patient started around 02/03/24.  Patient presents today for follow-up.  He noticed since starting eplerenone  that his blood pressure has actually increased.  Blood  pressure prior to starting was 132-139/70's. Since starting eplerenone  146-158/80-92.  He has been on it about 1 week.  He has also occasionally taken only 150 mg of irbesartan  to see if it helped with his fatigue.  He also occasionally takes higher dose of metoprolol .  Patient seems frustrated that his blood pressure is worse.  Reporting some fatigue and muscle pain.  He also  stopped taking his diclofenac 2 weeks ago which could explain the increase in both fatigue and muscle pain.  Patient really does not want to have the procedure for his adrenal gland therefore he is motivated to remain on eplerenone . He reports that the breast tenderness from spironolactone  never completely went away.  Endocrinology was not concerned and advised him that if it did not improve by his next visit that they would check some hormone levels.   We did discuss that there are a few new medications in the pipeline for hyperaldosteronism.   Current mediations: amlodipine  10 mg daily (AM), irbesartan  300 mg daily, metoprolol  succinate 25mg  daily, eplerenone  25mg  daily Previously tried mediations: olmesartan  40 mg, indapamide  (didn't react well), metoprolol , carvedilol , nebivolol  (leg weakness). Hydrochlorothiazide , indapamide  (didn't like the way it made him feel) BP goal: <130/80   Family History:  Mother- heart attack  Father - HTN, heart attack  Siblings- colitis, cardiomyopathy    Social History:  Alcohol: occasional 1-2 drinks per week Smoking: quit 42 years ago   Diet: eats home cooked meals most days of the week eats out once or twice week can reduce that to every other day. Limits salt intake  Exercise: 30-45 minutes on bike or elliptical 3-4x per week, has been doing some strength training  Home BP readings: Blood pressure prior to starting was 132-139/70's. Since starting eplerenone  146-158/80-92.   Wt Readings from Last 3 Encounters:  01/21/24 207 lb (93.9 kg)  12/27/23 203 lb 3.2 oz (92.2 kg)  12/07/23 204 lb (92.5 kg)   BP Readings from Last 3 Encounters:  02/11/24 (!) 170/86  01/21/24 (!) 162/80  12/27/23 134/86   Pulse Readings from Last 3 Encounters:  02/11/24 77  12/27/23 71  12/07/23 65    Renal function: CrCl cannot be calculated (Patient's most recent lab result is older than the maximum 21 days allowed.).  Past Medical History:  Diagnosis Date    Diverticulosis of colon    GERD (gastroesophageal reflux disease)    watches diet   Hemorrhoids, internal    History of adenomatous polyp of colon    History of anal fissures    History of basal cell carcinoma    History of COVID-19 01/2021   per pt mild symptoms that resolved   History of primary hyperparathyroidism 01/2016   endocrinologist--- dr sam;  s/p right superior parathyroidectomy 06/ 2018,  adenoma   Hyperlipidemia    Hypertension    Hypertrophic cardiomyopathy [facility])    cardiologist--- dr jeffrie   IBS (irritable bowel syndrome)    Inguinal hernia, bilateral    Nocturia    PONV (postoperative nausea and vomiting)    RBBB (right bundle branch block with left anterior fascicular block)    Seasonal allergies    Type 2 diabetes mellitus (HCC)    followed by dr sam (endocrinologist);;  (06-25-2021 pt stated does not check blood sugar at home)   Ulcerative colitis, left sided [facility])    followed by dr stark (GI)   Umbilical hernia    Wears glasses     Current Outpatient Medications on  File Prior to Visit  Medication Sig Dispense Refill   acetaminophen  (TYLENOL ) 500 MG tablet Take 500 mg by mouth every 6 (six) hours as needed.     amLODipine  (NORVASC ) 10 MG tablet TAKE 1 TABLET BY MOUTH EVERY DAY 90 tablet 2   diclofenac (VOLTAREN) 75 MG EC tablet Take 75 mg by mouth daily.     eplerenone  (INSPRA ) 25 MG tablet Take 1 tablet (25 mg total) by mouth daily. 30 tablet 6   irbesartan  (AVAPRO ) 300 MG tablet Take 1 tablet (300 mg total) by mouth daily. 90 tablet 3   Mesalamine  (ASACOL ) 400 MG CPDR DR capsule Take 3 capsules (1,200 mg total) by mouth daily. 270 capsule 3   metFORMIN  (GLUCOPHAGE -XR) 500 MG 24 hr tablet Take 1 tablet (500 mg total) by mouth daily with breakfast. 90 tablet 3   rosuvastatin  (CRESTOR ) 10 MG tablet Take 1 tablet (10 mg total) by mouth daily. 90 tablet 3   VITAMIN D  PO Take by mouth daily.     No current facility-administered medications on  file prior to visit.    Allergies  Allergen Reactions   Spironolactone  Other (See Comments)    Breast tenderness, leg spasms    Blood pressure (!) 170/86, pulse 77.   Assessment/Plan: HYPERTENSION CONTROL Vitals:   02/11/24 1331 02/11/24 1333  BP: (!) 170/90 (!) 170/86    The patient's blood pressure is elevated above target today.  In order to address the patient's elevated BP: Blood pressure will be monitored at home to determine if medication changes need to be made.      1. Hypertension -  Essential hypertension Assessment: New diagnosis of hyperaldosteronism, now on eplerenone  25 mg daily Patient has been tweaking some of his doses which may make it clear his affect a little clouded He would like to increase his metoprolol  back to 50 mg daily-I do not see any issue with this as he has plenty of heart rate room.  It was only decreased to see if it was contributing to his fatigue. Blood pressures at home have been higher since starting eplerenone   Plan: Has only been on eplerenone  for 1 week, we will plan to continue to monitor before any adjustments are made Encouraged patient not to cut irbesartan  in half but to take the full 300 mg Okay to increase metoprolol  succinate to 50 mg daily Once he has his repeat labs in January he will send me some updated blood pressure readings.  At that point if blood pressure still not well-controlled we will discuss with endocrinology about increasing eplerenone    Thank you,   Addis Bennie D Ryker [place], Pharm.D, BCACP, CPP [facility] of [facility] [address], [place], [place] [address]  Phone: [phone]; Fax: [phone]

## 2024-02-16 ENCOUNTER — Other Ambulatory Visit: Payer: Self-pay | Admitting: Cardiology

## 2024-02-17 ENCOUNTER — Ambulatory Visit: Admitting: Psychology

## 2024-02-17 DIAGNOSIS — F419 Anxiety disorder, unspecified: Secondary | ICD-10-CM

## 2024-02-17 MED ORDER — AMLODIPINE BESYLATE 10 MG PO TABS: 10.0000 mg | ORAL_TABLET | Freq: Every day | ORAL | 3 refills | Status: AC

## 2024-02-17 NOTE — Progress Notes (Signed)
° ° ° ° ° ° °  Behavioral Health Treatment Plan   Name:Patrick Floyd   MRN: [id number]   Treatment Plan Development Date: 01/09/25   Strengths: Supportive Relationships and Enjoys [employer] puzzles, lunch w/ friends weekly, working out 2x a week, walks, time w/ wife.   Supports: Spouse and Friends   Theatre Manager of Needs: to cope and work on stress w/ things w/ my daughter, feel less anxious/worried about and feel less anxious in certain social situations.   Treatment Level:outpatient counseling  Client Treatment Preferences:biweekly in person counseling.   Diagnosis: Anxiety  Anxiety d/o unspecified  Symptoms:  Difficulty managing worry, Restlessness or feeling keyed up or on edge, and Sleep disturbance (Difficulty falling or staying asleep, restlessness, or unsatisfying sleep  Goals:  Build and employ tools and skills to reduce symptoms of anxiety, worry, and improve functioning day-to-day.  Objectives: Target Date For All Objectives: 01/09/25  Develop coping tools to manage anxiety including mindfulness, acceptance, relaxation, reframing, and challenging negative thoughts and feelings., Develop consistent self-care routine including exercise, healthful eating, consistent sleep., and Develop healthy boundaries with daughter.  Progress Documentation:  Progressing  Interventions:  CBT - reframing, challenging, cognitive restructuring, ACT, Relaxation and mindfulness, and Assertiveness   and Tx Plan for Social Anxiety  Individualized Treatment Plan: 02/17/2024  Problem: Social Anxiety  Anxiety D/O unspecified  Symptoms:  Withdrawn and increased anxiety in certain social situations  Goals:  Increase engagement in a variety of social interactions.  Objectives: Target Date For All Objectives: 01/09/25  Learn and implement social skills to reduce anxiety and build confidence in social interactions  Progress  Documentation:  Progressing  Interventions:  CBT - reframing, challenging, cognitive restructuring, Relaxation and mindfulness, and Communication and conflict resolution skills  The patient and clinician reviewed the treatment plan on 02/17/2024. The patient provided verbal consent for the treatment plan and the consent for with electronic signature is on file in the patients electronic medical record.   Expected duration of treatment: reevaluate after 1 year  Party responsible for implementation of interventions: Pt and counselor   This plan has been reviewed and created by the following participants: pt and counselor   This plan will be reviewed at least every 12 months.   Yes, please see patient chart for sign off.   Abagail Limb, [facility]             [place], LCMHC

## 2024-02-17 NOTE — Progress Notes (Signed)
 [facility] Counselor/Therapist Progress Note  Patient ID: Patrick Floyd, MRN: [id number],    Date: 02/17/2024  Time Spent: 10:01am-10:49am  Pt is seen for in person visit.   Treatment Type: Individual Therapy  Reported Symptoms: anxiety, ruminating on worries  Mental Status Exam: Appearance:  Well Groomed     Behavior: Appropriate  Motor: Normal  Speech/Language:  Clear and Coherent and Normal Rate  Affect: Appropriate  Mood: anxious  Thought process: normal  Thought content:   WNL  Sensory/Perceptual disturbances:   WNL  Orientation: oriented to person, place, time/date, and situation  Attention: Good  Concentration: Good  Memory: WNL  Fund of knowledge:  Good  Insight:   Good  Judgment:  Good  Impulse Control: Good   Risk Assessment: Danger to Self:  No Self-injurious Behavior: No Danger to Others: No Duty to Warn:no Physical Aggression / Violence:No  Access to Firearms a concern: No  Gang Involvement:No   Subjective: Counselor assessed pt current functioning per pt report.  Processed w/pt stressors and anxiety causes.  Explored coping skills using and setting boundaries.  Discussed guilty and distortions and ways of reframing.  Encouraged continued use of mindful breathing, recognizing and reframing distortions.   Pt affect wnl. Pt reports that daughter communicating worries/negativity creates stress and anxiety for self.  Pt discussed text received this morning and how responded.  Pt discussed how ruminating on for self and want to change and coping w/out.  Pt expressed guilt feels w/ set boundary and is able ot acknowledge and reframe distortion w/ counselor assistance.  Pt discussed breath work helpful, music and exercise. Pt agrees to continuing being intentional about use of coping skills.     Interventions: Cognitive Behavioral Therapy, Mindfulness Meditation, and supportive  Diagnosis:Anxiety disorder, unspecified type  Plan: Pt to f/u w/  counseling in 3 weeks. Pt to f/u as scheduled w/ PCP.           BARBARANN APPL, LCMHC

## 2024-02-25 ENCOUNTER — Encounter: Payer: Self-pay | Admitting: *Deleted

## 2024-02-25 NOTE — Progress Notes (Signed)
 Patrick Floyd                                          MRN: [id number]   02/25/2024   The [facility] Quality Team Specialist reviewed this patient medical record for the purposes of chart review for care gap closure. The following were reviewed: chart review for care gap closure-controlling blood pressure.    [facility] Quality Team

## 2024-03-01 ENCOUNTER — Other Ambulatory Visit (HOSPITAL_COMMUNITY): Payer: Self-pay

## 2024-03-06 ENCOUNTER — Other Ambulatory Visit

## 2024-03-07 ENCOUNTER — Ambulatory Visit: Payer: Self-pay | Admitting: Internal Medicine

## 2024-03-07 LAB — BASIC METABOLIC PANEL WITH GFR
BUN: 23 mg/dL (ref 7–25)
CO2: 29 mmol/L (ref 20–32)
Calcium: 9.6 mg/dL (ref 8.6–10.3)
Chloride: 101 mmol/L (ref 98–110)
Creat: 1.03 mg/dL (ref 0.70–1.28)
Glucose, Bld: 164 mg/dL — ABNORMAL HIGH (ref 65–99)
Potassium: 3.8 mmol/L (ref 3.5–5.3)
Sodium: 140 mmol/L (ref 135–146)
eGFR: 75 mL/min/1.73m2

## 2024-03-20 ENCOUNTER — Encounter: Payer: Self-pay | Admitting: Internal Medicine

## 2024-03-20 ENCOUNTER — Ambulatory Visit: Admitting: Psychology

## 2024-03-20 DIAGNOSIS — F419 Anxiety disorder, unspecified: Secondary | ICD-10-CM

## 2024-03-20 NOTE — Progress Notes (Signed)
" ° ° ° ° ° ° °  Behavioral Health Treatment Plan   Name:Patrick Floyd   MRN: [id number]   Treatment Plan Development Date: 01/09/25   Strengths: Supportive Relationships and Enjoys [employer] puzzles, lunch w/ friends weekly, working out 2x a week, walks, time w/ wife.   Supports: Spouse and Friends   Theatre Manager of Needs: to cope and work on stress w/ things w/ my daughter, feel less anxious/worried about and feel less anxious in certain social situations.   Treatment Level:outpatient counseling  Client Treatment Preferences:biweekly in person counseling.   Diagnosis: Anxiety  Anxiety d/o unspecified  Symptoms:  Difficulty managing worry, Restlessness or feeling keyed up or on edge, and Sleep disturbance (Difficulty falling or staying asleep, restlessness, or unsatisfying sleep  Goals:  Build and employ tools and skills to reduce symptoms of anxiety, worry, and improve functioning day-to-day.  Objectives: Target Date For All Objectives: 01/09/25  Develop coping tools to manage anxiety including mindfulness, acceptance, relaxation, reframing, and challenging negative thoughts and feelings., Develop consistent self-care routine including exercise, healthful eating, consistent sleep., and Develop healthy boundaries with daughter.  Progress Documentation:  Progressing  Interventions:  CBT - reframing, challenging, cognitive restructuring, ACT, Relaxation and mindfulness, and Assertiveness   and Tx Plan for Social Anxiety  Individualized Treatment Plan: 03/20/2024  Problem: Social Anxiety  Anxiety D/O unspecified  Symptoms:  Withdrawn and increased anxiety in certain social situations  Goals:  Increase engagement in a variety of social interactions.  Objectives: Target Date For All Objectives: 01/09/25  Learn and implement social skills to reduce anxiety and build confidence in social interactions  Progress  Documentation:  Progressing  Interventions:  CBT - reframing, challenging, cognitive restructuring, Relaxation and mindfulness, and Communication and conflict resolution skills  The patient and clinician reviewed the treatment plan on 03/20/2024. The patient provided verbal consent for the treatment plan and the consent for with electronic signature is on file in the patients electronic medical record.   Expected duration of treatment: reevaluate after 1 year  Party responsible for implementation of interventions: Pt and counselor   This plan has been reviewed and created by the following participants: pt and counselor   This plan will be reviewed at least every 12 months.   Yes, please see patient chart for sign off.   Porscha Axley, LCMHC             Luella Gardenhire, LCMHC               Jayleana Colberg, LCMHC "

## 2024-03-20 NOTE — Progress Notes (Signed)
"    [facility] Counselor/Therapist Progress Note  Patient ID: Patrick Floyd, MRN: [id number],    Date: 03/20/2024  Time Spent: 10:01am-10:55am  Pt is seen for in person visit.   Treatment Type: Individual Therapy  Reported Symptoms: anxious/on edge.  Mental Status Exam: Appearance:  Well Groomed     Behavior: Appropriate  Motor: Normal  Speech/Language:  Clear and Coherent and Normal Rate  Affect: Appropriate  Mood: anxious  Thought process: normal  Thought content:   WNL  Sensory/Perceptual disturbances:   WNL  Orientation: oriented to person, place, time/date, and situation  Attention: Good  Concentration: Good  Memory: WNL  Fund of knowledge:  Good  Insight:   Good  Judgment:  Good  Impulse Control: Good   Risk Assessment: Danger to Self:  No Self-injurious Behavior: No Danger to Others: No Duty to Warn:no Physical Aggression / Violence:No  Access to Firearms a concern: No  Gang Involvement:No   Subjective: Counselor assessed pt current functioning per pt report.  Processed w/pt recent stressors and impact on anxiety.  Explored self care and encouraged positive self care to assist in recovering from increased stress.  Psychoeducation re: mindful breathing and led through mindful breath practice and discussed how to utilize for self.  Identified boundaries for interactions w/ daughter.   Pt affect congruent w/ report of anxiety.  Pt reports that daughter was in hospital 2 weeks ago w/ bad flare of chron's.  Pt went there and stayed while in hospital and another week when she returned home.  Pt reports now trying to recover physically and mentally.  Pt expressed feeling more on edge this morning.  Pt participate in breath practice and reports relaxing and benefits.  Pt also identified exercise and sci fi shows as positives.  Pt identified ways to practice mindful breath for self.  Pt identified boundaries w/ daughter to better manage not taking on stress.       Interventions: Cognitive Behavioral Therapy, Mindfulness Meditation, and supportive  Diagnosis:Anxiety disorder, unspecified type  Plan: Pt to f/u w/ counseling in 2 weeks. Pt to f/u as scheduled w/ PCP.           Leslieann Whisman, [facility]               Montee Tallman, [facility] "

## 2024-03-22 NOTE — Telephone Encounter (Signed)
 He needs to be seen

## 2024-03-29 NOTE — Progress Notes (Signed)
 Patrick Floyd                                          MRN: [id number]   03/29/2024   The [facility] Quality Team Specialist reviewed this patient medical record for the purposes of chart review for care gap closure. The following were reviewed: chart review for care gap closure-controlling blood pressure.    [facility]BCI Quality Team

## 2024-04-03 ENCOUNTER — Ambulatory Visit: Admitting: Psychology

## 2024-04-11 ENCOUNTER — Encounter: Admitting: Internal Medicine

## 2024-04-17 ENCOUNTER — Ambulatory Visit: Admitting: Psychology

## 2024-05-05 ENCOUNTER — Ambulatory Visit: Admitting: Internal Medicine

## 2024-05-26 ENCOUNTER — Ambulatory Visit: Admitting: Internal Medicine
